# Patient Record
Sex: Male | Born: 1940
Health system: Southern US, Community
[De-identification: ages and names within clinical notes are randomized; demographics above are authoritative.]

## PROBLEM LIST (undated history)

## (undated) DIAGNOSIS — T148XXA Other injury of unspecified body region, initial encounter: Secondary | ICD-10-CM

## (undated) DIAGNOSIS — M1990 Unspecified osteoarthritis, unspecified site: Secondary | ICD-10-CM

## (undated) DIAGNOSIS — J189 Pneumonia, unspecified organism: Secondary | ICD-10-CM

## (undated) DIAGNOSIS — M109 Gout, unspecified: Secondary | ICD-10-CM

## (undated) DIAGNOSIS — T8859XA Other complications of anesthesia, initial encounter: Secondary | ICD-10-CM

## (undated) DIAGNOSIS — D229 Melanocytic nevi, unspecified: Secondary | ICD-10-CM

## (undated) DIAGNOSIS — C801 Malignant (primary) neoplasm, unspecified: Secondary | ICD-10-CM

## (undated) DIAGNOSIS — W548XXA Other contact with dog, initial encounter: Secondary | ICD-10-CM

## (undated) DIAGNOSIS — C4492 Squamous cell carcinoma of skin, unspecified: Secondary | ICD-10-CM

## (undated) DIAGNOSIS — L0390 Cellulitis, unspecified: Secondary | ICD-10-CM

## (undated) DIAGNOSIS — I10 Essential (primary) hypertension: Secondary | ICD-10-CM

## (undated) DIAGNOSIS — T4145XA Adverse effect of unspecified anesthetic, initial encounter: Secondary | ICD-10-CM

## (undated) HISTORY — PX: JOINT REPLACEMENT: SHX530

## (undated) HISTORY — PX: BACK SURGERY: SHX140

## (undated) HISTORY — PX: TOTAL KNEE ARTHROPLASTY: SHX125

---
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
## 1997-10-11 DIAGNOSIS — D229 Melanocytic nevi, unspecified: Secondary | ICD-10-CM

## 1997-10-11 HISTORY — DX: Melanocytic nevi, unspecified: D22.9

## 1998-12-31 HISTORY — PX: KNEE ARTHROSCOPY: SUR90

## 2000-10-08 ENCOUNTER — Encounter: Payer: Self-pay | Admitting: Neurological Surgery

## 2000-10-08 ENCOUNTER — Ambulatory Visit (HOSPITAL_COMMUNITY): Admission: RE | Admit: 2000-10-08 | Discharge: 2000-10-09 | Payer: Self-pay | Admitting: Neurological Surgery

## 2001-07-23 ENCOUNTER — Ambulatory Visit (HOSPITAL_COMMUNITY): Admission: RE | Admit: 2001-07-23 | Discharge: 2001-07-23 | Payer: Self-pay | Admitting: *Deleted

## 2001-07-23 ENCOUNTER — Encounter: Payer: Self-pay | Admitting: *Deleted

## 2001-12-31 HISTORY — PX: CYSTECTOMY: SUR359

## 2002-06-09 ENCOUNTER — Ambulatory Visit (HOSPITAL_COMMUNITY): Admission: RE | Admit: 2002-06-09 | Discharge: 2002-06-09 | Payer: Self-pay | Admitting: Family Medicine

## 2002-06-09 ENCOUNTER — Encounter: Payer: Self-pay | Admitting: Family Medicine

## 2003-04-08 ENCOUNTER — Encounter: Payer: Self-pay | Admitting: Emergency Medicine

## 2003-04-08 ENCOUNTER — Emergency Department (HOSPITAL_COMMUNITY): Admission: EM | Admit: 2003-04-08 | Discharge: 2003-04-08 | Payer: Self-pay | Admitting: Emergency Medicine

## 2004-08-08 ENCOUNTER — Ambulatory Visit (HOSPITAL_COMMUNITY): Admission: RE | Admit: 2004-08-08 | Discharge: 2004-08-08 | Payer: Self-pay | Admitting: Family Medicine

## 2004-11-21 DIAGNOSIS — C801 Malignant (primary) neoplasm, unspecified: Secondary | ICD-10-CM

## 2004-11-21 HISTORY — DX: Malignant (primary) neoplasm, unspecified: C80.1

## 2005-01-29 ENCOUNTER — Ambulatory Visit (HOSPITAL_COMMUNITY): Admission: RE | Admit: 2005-01-29 | Discharge: 2005-01-29 | Payer: Self-pay | Admitting: Family Medicine

## 2005-02-01 ENCOUNTER — Ambulatory Visit (HOSPITAL_COMMUNITY): Admission: RE | Admit: 2005-02-01 | Discharge: 2005-02-01 | Payer: Self-pay | Admitting: Family Medicine

## 2005-09-04 ENCOUNTER — Ambulatory Visit: Payer: Self-pay | Admitting: Internal Medicine

## 2005-09-04 ENCOUNTER — Ambulatory Visit (HOSPITAL_COMMUNITY): Admission: RE | Admit: 2005-09-04 | Discharge: 2005-09-04 | Payer: Self-pay | Admitting: Internal Medicine

## 2006-07-17 ENCOUNTER — Ambulatory Visit (HOSPITAL_BASED_OUTPATIENT_CLINIC_OR_DEPARTMENT_OTHER): Admission: RE | Admit: 2006-07-17 | Discharge: 2006-07-17 | Payer: Self-pay | Admitting: Orthopedic Surgery

## 2010-07-19 ENCOUNTER — Ambulatory Visit (HOSPITAL_COMMUNITY): Admission: RE | Admit: 2010-07-19 | Discharge: 2010-07-19 | Payer: Self-pay | Admitting: Family Medicine

## 2010-10-18 ENCOUNTER — Encounter: Payer: Self-pay | Admitting: Internal Medicine

## 2010-11-10 ENCOUNTER — Ambulatory Visit (HOSPITAL_COMMUNITY): Admission: RE | Admit: 2010-11-10 | Discharge: 2010-11-10 | Payer: Self-pay | Admitting: Internal Medicine

## 2010-11-10 ENCOUNTER — Ambulatory Visit: Payer: Self-pay | Admitting: Internal Medicine

## 2010-11-10 HISTORY — PX: COLONOSCOPY: SHX174

## 2010-11-15 ENCOUNTER — Encounter: Payer: Self-pay | Admitting: Internal Medicine

## 2011-01-30 NOTE — Letter (Signed)
Summary: Patient Notice, Colon Biopsy Results  [facility]  [address]   [place], [place] [address]   Phone: [phone]  Fax: [phone]       November 15, 2010   [facility] [address] [place], [place]  [address] Oct 30, 1941    Dear Nicholas Lewis,  I am pleased to inform you that the biopsies taken during your recent colonoscopy did not show any evidence of cancer upon pathologic examination.  Additional information/recommendations:  No further action is needed at this time.  Please follow-up with your primary care physician for your other healthcare needs.  You should have a repeat colonoscopy examination  in 5 years.  Please call us if you are having persistent problems or have questions about your condition that have not been fully answered at this time.  Sincerely,    R. Roetta Sessions MD, FACP [facility] Ph: [phone]    Fax: [phone]   Appended Document: Patient Notice, Colon Biopsy Results Letter mailed to pt.  Appended Document: Patient Notice, Colon Biopsy Results reminder in computer

## 2011-01-30 NOTE — Letter (Signed)
Summary: TRIAGE ORDER  TRIAGE ORDER   Imported By: Ave Filter 10/18/2010 09:25:22  _____________________________________________________________________  External Attachment:    Type:   Image     Comment:   External Document  Appended Document: TRIAGE ORDER ok as is  Appended Document: TRIAGE ORDER Instructions mailed.

## 2011-05-18 NOTE — Op Note (Signed)
NAMEONOFRE, GAINS NO.:  [id number]   MEDICAL RECORD NO.:  [id number]          PATIENT TYPE:  AMB   LOCATION:  [facility]                         FACILITY:  [facility]   PHYSICIAN:  Marlowe Kays, M.D.  DATE OF BIRTH:  1941-06-29   DATE OF PROCEDURE:  07/17/2006  DATE OF DISCHARGE:                                 OPERATIVE REPORT   PREOPERATIVE DIAGNOSIS:  1.  Torn medial meniscus.  2.  Osteoarthritis left knee.   POSTOPERATIVE DIAGNOSIS:  1.  Torn medial meniscus.  2.  Osteoarthritis left knee.   OPERATION:  Left knee arthroscopy with:  1.  Partial medial meniscectomy.  2.  Debridement of medial femoral condyle and medial tibial plateau.   SURGEON:  Marlowe Kays, M.D.   ASSISTANT:  Nurse.   ANESTHESIA:  General.   INDICATIONS FOR PROCEDURE:  Pain and swelling of the left knee with plain x-  rays demonstrating some medial compartment narrowing and MRI demonstrating a  posterior horn tear of medial meniscus.  There also was felt to be a little  mucoid degeneration of the ACL and the intercondylar area.   PROCEDURE:  Satisfactory general anesthesia, pneumatic tourniquet, left leg  was Esmarch'd out non-sterilely, thigh stabilizer applied, and the leg  prepped with DuraPrep from stabilizer to ankle and draped in a sterile  field.  Ace wrap and knee support for right knee.  Superior medial saline  inflow. First, through an anterolateral portal, the medial compartment of  the knee joint was evaluated.  He had a good bit of synovitis in the  anterior compartment and a little roughening of the anterior third of the  medial meniscus.  I debrided out the synovium and shaved down the medial  meniscus.  There was a large osteophyte anteriorly which I was able to shave  down with the shaver.  His medial femoral condyle had a grade 2-3 out of 4  chondromalacia and I was able to smooth this down gently with the shaver.  Posteriorly, he had fairly pronounced tear  beginning at the posterior curve  and extending to the posterior third which I managed with a combination of  cutting out with arthroscopic scissors and shaving down until smooth with a  3.5 shaver.  Adjacent to this was a fairly substantial area of full  thickness wear of the medial tibial plateau which, however, did not require  any shaving. The intercondylar area of the ACL did have a little  fragmentation or fraying which I resected with some small baskets.  Pre and  post films here were taken.  I then looked up in the medial gutter and  suprapatellar area.  He had some patellar wear but nothing treatable.  I  then reversed portals.  There was some wear on both sides of the joint and  some fraying of the lateral meniscus, but nothing that required any  arthroscopic treatment.  I then irrigated the knee joint until clear and all  fluid possible was removed.  I closed the two anterior portals with 4-0  nylon and then injected through the inflow apparatus 20 mL  0.5% Marcaine  with  adrenaline and 4 mg of morphine. I then closed this portal, as well, with 4-  0 nylon.  Adaptic and dry, sterile dressing were applied.  The tourniquet  was released.  He tolerated the procedure well and was taken to the recovery  room in satisfactory condition with no known complications.           ______________________________  Marlowe Kays, M.D.     JA/MEDQ  D:  07/17/2006  T:  07/17/2006  Job:  [id number]

## 2011-05-18 NOTE — Op Note (Signed)
[place]  Patient:    Nicholas Lewis, Nicholas Lewis                     MRN: [id number] Proc. Date: 10/08/00 Adm. Date:  [id number] Disc. Date: [id number] Attending:  Jonne Ply                           Operative Report  PREOPERATIVE DIAGNOSIS:  L4-5 left synovial cyst (spondylosis) with radiculopathy.  POSTOPERATIVE DIAGNOSIS:  L4-5 left synovial cyst (spondylosis with radiculopathy.  OPERATION PERFORMED:  Left L4-5 laminotomy, foraminotomy, resection of synovial cyst with MetRx system, operating microscope and microdissection technique.  SURGEON:  Stefani Dama, M.D.  ASSISTANT:  Tanya Nones. Jeral Fruit, M.D.  ANESTHESIA:  General endotracheal.  INDICATIONS FOR PROCEDURE:  The patient is a 70 year old individual who has had significant back and left lower extremity pain.  An MRI demonstrates presence of a synovial cyst with spondylitic changes at the L4-5 joint.  He was advised regarding surgery.  DESCRIPTION OF PROCEDURE:  The patient was brought to the operating room supine on a stretcher.  After smooth induction of general endotracheal anesthesia.  He was placed in the prone position, the bony prominences were appropriately padded and protected.  Then fluoroscopic imaging was brought into place to localize the L4-5 interspace.  The back was prepped with DuraPrep, draped in sterile fashion once the areas were marked.  A K-wire was then passed down to the laminar arch of L4.  The lateral region of the L4-5 space was localized.  Then a series of probes was passed over this K-wire after an incision was made measuring 15 mm in length.  The dissection was done bluntly using the initial then the dilating trocar and the area was dilated up to the 18 mm size.  A 6 cm depth trocar was then placed into the wound to provide the endoscopic imaging to the level of the L4-5 facet joint.  A laminotomy was then created removing the inferior margin of the  lamina of L4 out to the mesial wall of the facet.  A 2 and 3 mm Kerrison punch was then used to take up the yellow ligament.  The common dural tube was identified. The lateral aspect of the dural tube was noted to be severely adherent to a mass which protruded and emanated from the facet joint.  Dissection of the dura was very difficult.  The mass itself was then entered and within the mass there was noted to be a significant amount of degenerated thrombus material with some fluid consistent with synovium.  This was the inside of the cyst. The dissection was then slowly carried inferiorly to expose the inferior margin of the cyst.  This was taken down using a combination of Midas Rex drill with microdissection technique and a microscope under high power magnification evacuating the cyst wall and then eventually dissecting the cyst wall from the dura.  In the end the common dural tube, the L5 nerve root was well decompressed from the cyst.  The cyst came out in bits and pieces in a very piecemeal fashion.  The facet joint was noted to remain intact.  The area was then checked for hemostasis and once this was achieved, the trocar was removed from the wound.  The microscope was removed from the wound.  A single 2-0 stitch was placed in the fascia which could be identified in the depth and  then 3-0 Vicryl was used to close the skin subcuticularly.  The patient tolerated the procedure well.  Estimated blood loss was estimated at less than 20 cc. DD:  10/08/00 TD:  10/09/00 Job: [id number]

## 2011-05-18 NOTE — Op Note (Signed)
NAMEULMER, DEGEN              ACCOUNT NO.:  [id number]   MEDICAL RECORD NO.:  [id number]          PATIENT TYPE:  AMB   LOCATION:  DAY                           FACILITY:  [facility]   PHYSICIAN:  R. Roetta Sessions, M.D. DATE OF BIRTH:  Jan 02, 1941   DATE OF PROCEDURE:  09/04/2005  DATE OF DISCHARGE:                                 OPERATIVE REPORT   PROCEDURE:  Screening colonoscopy, high risk.   INDICATIONS FOR PROCEDURE:  The patient is a 70 year old gentleman with no  GI symptoms who comes for high-risk screening colonoscopy with positive  family history of colon cancer in his father. He had a colonoscopy five  years ago which was normal. Colonoscopy is now being done as a high-risk  screening maneuver. Potential risks, benefits, and alternatives have been  reviewed and questions answered. He is agreeable. Please see documentation  in the medical record.   PROCEDURE NOTE:  O2 saturation, blood pressure, pulse, and respirations were  monitored throughout the entire procedure. Conscious sedation with Versed 3  mg IV and Demerol 50 mg IV in divided doses.   INSTRUMENT:  Olympus video chip system.   FINDINGS:  Digital rectal exam revealed no abnormalities.   ENDOSCOPIC FINDINGS:  Prep was adequate.   Rectum:  Examination of the rectal mucosa including retroflexed view of the  anal verge revealed no abnormalities.   Colon:  Colonic mucosa was surveyed from the rectosigmoid junction through  the left, transverse, and right colon to the area of the appendiceal  orifice, ileocecal valve, and cecum. These structures were well seen and  photographed for the record. From this level, the scope was slowly  withdrawn, and all previously mentioned mucosal surfaces were again seen.  The colonic mucosa appeared normal, although the colon was elongated and  tortuous, requiring a number of maneuvers including changing of the  patient's position and external abdominal pressure to reach the cecum.  The  patient tolerated the procedure well and was reactive to endoscopy.   IMPRESSION:  1.  Normal rectum.  2.  Elongated, tortuous but otherwise normal appearing colon.   RECOMMENDATIONS:  Repeat screening colonoscopy in five years.      Jonathon Bellows, M.D.  Electronically Signed     RMR/MEDQ  D:  09/04/2005  T:  09/04/2005  Job:  [id number]   cc:   [facility]

## 2011-12-06 ENCOUNTER — Other Ambulatory Visit (HOSPITAL_COMMUNITY): Payer: Self-pay | Admitting: Orthopedic Surgery

## 2011-12-06 DIAGNOSIS — M545 Low back pain, unspecified: Secondary | ICD-10-CM

## 2011-12-07 ENCOUNTER — Ambulatory Visit (HOSPITAL_COMMUNITY)
Admission: RE | Admit: 2011-12-07 | Discharge: 2011-12-07 | Disposition: A | Discharge status: Home / Self Care | Payer: Medicare Other | Source: Ambulatory Visit | Attending: Orthopedic Surgery | Admitting: Orthopedic Surgery

## 2011-12-07 DIAGNOSIS — M5126 Other intervertebral disc displacement, lumbar region: Secondary | ICD-10-CM | POA: Insufficient documentation

## 2011-12-07 DIAGNOSIS — M545 Low back pain, unspecified: Secondary | ICD-10-CM | POA: Insufficient documentation

## 2012-03-19 DIAGNOSIS — J029 Acute pharyngitis, unspecified: Secondary | ICD-10-CM | POA: Diagnosis not present

## 2012-06-20 DIAGNOSIS — IMO0002 Reserved for concepts with insufficient information to code with codable children: Secondary | ICD-10-CM | POA: Diagnosis not present

## 2012-06-20 DIAGNOSIS — M1710 Unilateral primary osteoarthritis, unspecified knee: Secondary | ICD-10-CM | POA: Diagnosis not present

## 2012-07-21 ENCOUNTER — Other Ambulatory Visit: Payer: Self-pay | Admitting: Orthopedic Surgery

## 2012-07-21 NOTE — H&P (Signed)
Nicholas Lewis, TRAMELL NO.:  [id number]  MEDICAL RECORD NO.:  [id number]  LOCATION:                               FACILITY:  [facility]  PHYSICIAN:  Marlowe Kays, M.D.  DATE OF BIRTH:  Jan 05, 1941  DATE OF ADMISSION:  08/06/2012 DATE OF DISCHARGE:                             HISTORY & PHYSICAL   CHIEF COMPLAINT:  "Pain in my left knee."  PRESENT ILLNESS:  This 71 year old white male has been seen by [place] for continued progressive problems concerning his left knee.  He has had serial x-rays over the years, showing deterioration of the articular cartilage of the left knee.  We tried conservative care with viscosupplementation for a long period of time.  He has even had a knee arthroscopy in the past with removal of injured cartilage as well. After much discussion including the risks and benefits of surgery, we decided to go ahead with total knee replacement arthroplasty of the left knee.  PAST MEDICAL HISTORY:  The patient has been in relatively good health throughout his lifetime.  He has developed hypertension, currently being treated by Dr. Assunta Found.  He has other minor arthritis but mainly in his left knee.  Spinal cyst removed by Dr. Erma Pinto in 2013.  His left knee arthroscopy was in 2000.  He also has a history of gout.  FAMILY HISTORY:  Positive for colon cancer in the father, dying at 38 years of age, scleroderma in mother at 29 years of age.  Siblings, 1 sister with breast cancer.  SOCIAL HISTORY:  The patient is married.  Never intake of alcohol or tobacco products.  No EtOH.  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  ALLERGIES:  He is allergic to SULFA.  CURRENT MEDICATIONS:  Metoprolol 100 mg twice a day, allopurinol 300 mg daily, nabumetone 500 mg daily, hydrochlorothiazide 25 mg daily, nasal sprays daily, multivitamin saw palmetto, aspirin every other day.  PHYSICAL EXAMINATION:  GENERAL:  Alert and cooperative, friendly  71 year old, 250 pounds, 6 feet white male, accompanied by his wife, [name]. VITAL SIGNS:  Blood pressure 139/87, pulse 68, respirations 12 unlabored. HEENT:  Normocephalic.  PERRLA.  EOM intact.  Oropharynx clear. CHEST:  Clear to auscultation.  No rhonchi.  No rales. HEART:  Regular rate and rhythm.  No murmurs are heard. ABDOMEN:  Soft, nontender.  Liver, spleen not felt. GENITALIA/ RECTAL:  Not done.  Not pertinent to his illness. EXTREMITIES:  The patient has some mild crepitus with pain, range of motion, left knee, and a varus deformity.  PLAN:  The patient will be admitted for total knee replacement arthroplasty.  We discussed that at this history and physical, the risks and benefits of surgery again as well as the perioperative plan including home physical therapy.     Nicholas Lewis June.   ______________________________ Marlowe Kays, M.D.    DLU/MEDQ  D:  07/18/2012  T:  07/19/2012  Job:  [id number]

## 2012-07-22 ENCOUNTER — Encounter (HOSPITAL_COMMUNITY): Payer: Self-pay | Admitting: Pharmacy Technician

## 2012-07-23 ENCOUNTER — Encounter (HOSPITAL_COMMUNITY): Payer: Self-pay

## 2012-07-23 ENCOUNTER — Encounter (HOSPITAL_COMMUNITY)
Admission: RE | Admit: 2012-07-23 | Discharge: 2012-07-23 | Disposition: A | Discharge status: Home / Self Care | Payer: Medicare Other | Source: Ambulatory Visit | Attending: Orthopedic Surgery | Admitting: Orthopedic Surgery

## 2012-07-23 ENCOUNTER — Ambulatory Visit (HOSPITAL_COMMUNITY)
Admission: RE | Admit: 2012-07-23 | Discharge: 2012-07-23 | Disposition: A | Discharge status: Home / Self Care | Payer: Medicare Other | Source: Ambulatory Visit | Attending: Orthopedic Surgery | Admitting: Orthopedic Surgery

## 2012-07-23 DIAGNOSIS — I10 Essential (primary) hypertension: Secondary | ICD-10-CM | POA: Diagnosis not present

## 2012-07-23 DIAGNOSIS — M1710 Unilateral primary osteoarthritis, unspecified knee: Secondary | ICD-10-CM | POA: Insufficient documentation

## 2012-07-23 DIAGNOSIS — Z01812 Encounter for preprocedural laboratory examination: Secondary | ICD-10-CM | POA: Diagnosis not present

## 2012-07-23 HISTORY — DX: Other injury of unspecified body region, initial encounter: T14.8XXA

## 2012-07-23 HISTORY — DX: Gout, unspecified: M10.9

## 2012-07-23 HISTORY — DX: Unspecified osteoarthritis, unspecified site: M19.90

## 2012-07-23 HISTORY — DX: Adverse effect of unspecified anesthetic, initial encounter: T41.45XA

## 2012-07-23 HISTORY — DX: Malignant (primary) neoplasm, unspecified: C80.1

## 2012-07-23 HISTORY — DX: Other complications of anesthesia, initial encounter: T88.59XA

## 2012-07-23 HISTORY — DX: Essential (primary) hypertension: I10

## 2012-07-23 LAB — BASIC METABOLIC PANEL
BUN: 22 mg/dL (ref 6–23)
CO2: 27 mEq/L (ref 19–32)
Calcium: 9.3 mg/dL (ref 8.4–10.5)
Chloride: 103 mEq/L (ref 96–112)
Creatinine, Ser: 1.14 mg/dL (ref 0.50–1.35)
GFR calc Af Amer: 73 mL/min — ABNORMAL LOW (ref >90)
GFR calc non Af Amer: 63 mL/min — ABNORMAL LOW (ref >90)
Glucose, Bld: 99 mg/dL (ref 70–99)
Potassium: 3.4 mEq/L — ABNORMAL LOW (ref 3.5–5.1)
Sodium: 140 mEq/L (ref 135–145)

## 2012-07-23 LAB — SURGICAL PCR SCREEN
MRSA, PCR: NEGATIVE
Staphylococcus aureus: NEGATIVE

## 2012-07-23 LAB — CBC
HCT: 41.3 % (ref 39.0–52.0)
Hemoglobin: 14.6 g/dL (ref 13.0–17.0)
MCH: 31.1 pg (ref 26.0–34.0)
MCHC: 35.4 g/dL (ref 30.0–36.0)
MCV: 87.9 fL (ref 78.0–100.0)
Platelets: 168 10*3/uL (ref 150–400)
RBC: 4.7 MIL/uL (ref 4.22–5.81)
RDW: 13.2 % (ref 11.5–15.5)
WBC: 7.7 10*3/uL (ref 4.0–10.5)

## 2012-07-23 LAB — APTT: aPTT: 30 seconds (ref 24–37)

## 2012-07-23 LAB — PROTIME-INR
INR: 1.02 (ref 0.00–1.49)
Prothrombin Time: 13.6 seconds (ref 11.6–15.2)

## 2012-07-23 NOTE — Patient Instructions (Addendum)
[address] [facility]  07/23/2012   Your procedure is scheduled on:  08/06/12 8:30 AM  Report to SHORT STAY DEPT  at 6:30 AM.  Call this number if you have problems the morning of surgery: [phone]   Remember:   Do not eat food or drink liquids AFTER MIDNIGHT    Take these medicines the morning of surgery with A SIP OF WATER: ALLOPURINOL / METOPROLOL   Do not wear jewelry, make-up or nail polish.  Do not wear lotions, powders, or perfumes.   Do not shave legs or underarms 48 hrs. before surgery (men may shave face)  Do not bring valuables to the hospital.  Contacts, dentures or bridgework may not be worn into surgery.  Leave suitcase in the car. After surgery it may be brought to your room.  For patients admitted to the hospital, checkout time is 11:00 AM the day of discharge.   Patients discharged the day of surgery will not be allowed to drive home.    Special Instructions:   Please read over the following fact sheets that you were given: MRSA  Information                SHOWER WITH BETASEPT THE NIGHT BEFORE SURGERY AND THE MORNING OF SURGERY

## 2012-07-23 NOTE — Progress Notes (Signed)
07/23/12 1112  OBSTRUCTIVE SLEEP APNEA  Have you ever been diagnosed with sleep apnea through a sleep study? No  Do you snore loudly (loud enough to be heard through closed doors)?  0  Do you often feel tired, fatigued, or sleepy during the daytime? 0  Has anyone observed you stop breathing during your sleep? 0  Do you have, or are you being treated for high blood pressure? 1  BMI more than 35 kg/m2? 0  Age over 70 years old? 1  Neck circumference greater than 40 cm/18 inches? 1  Gender: 1  Obstructive Sleep Apnea Score 4   Score 4 or greater  Updated health history;Results sent to PCP

## 2012-07-31 DIAGNOSIS — L0390 Cellulitis, unspecified: Secondary | ICD-10-CM

## 2012-07-31 HISTORY — DX: Cellulitis, unspecified: L03.90

## 2012-08-04 DIAGNOSIS — M4310 Spondylolisthesis, site unspecified: Secondary | ICD-10-CM | POA: Diagnosis not present

## 2012-08-04 DIAGNOSIS — IMO0002 Reserved for concepts with insufficient information to code with codable children: Secondary | ICD-10-CM | POA: Diagnosis not present

## 2012-08-06 ENCOUNTER — Encounter (HOSPITAL_COMMUNITY): Payer: Self-pay | Admitting: Orthopedic Surgery

## 2012-08-06 ENCOUNTER — Encounter (HOSPITAL_COMMUNITY): Payer: Self-pay | Admitting: Anesthesiology

## 2012-08-06 ENCOUNTER — Inpatient Hospital Stay (HOSPITAL_COMMUNITY)
Admission: RE | Admit: 2012-08-06 | Discharge: 2012-08-10 | DRG: 470 | Disposition: A | Discharge status: Home Health | Payer: Medicare Other | Source: Ambulatory Visit | Attending: Orthopedic Surgery | Admitting: Orthopedic Surgery

## 2012-08-06 ENCOUNTER — Ambulatory Visit (HOSPITAL_COMMUNITY): Payer: Medicare Other | Admitting: Anesthesiology

## 2012-08-06 ENCOUNTER — Ambulatory Visit (HOSPITAL_COMMUNITY): Payer: Medicare Other

## 2012-08-06 ENCOUNTER — Encounter (HOSPITAL_COMMUNITY): Payer: Self-pay | Admitting: *Deleted

## 2012-08-06 ENCOUNTER — Encounter (HOSPITAL_COMMUNITY)
Admission: RE | Disposition: A | Discharge status: Home Health | Payer: Self-pay | Source: Ambulatory Visit | Attending: Orthopedic Surgery

## 2012-08-06 DIAGNOSIS — I10 Essential (primary) hypertension: Secondary | ICD-10-CM | POA: Diagnosis not present

## 2012-08-06 DIAGNOSIS — M1710 Unilateral primary osteoarthritis, unspecified knee: Secondary | ICD-10-CM | POA: Diagnosis not present

## 2012-08-06 DIAGNOSIS — Z471 Aftercare following joint replacement surgery: Secondary | ICD-10-CM | POA: Diagnosis not present

## 2012-08-06 DIAGNOSIS — M25569 Pain in unspecified knee: Secondary | ICD-10-CM | POA: Diagnosis not present

## 2012-08-06 DIAGNOSIS — Z96659 Presence of unspecified artificial knee joint: Secondary | ICD-10-CM

## 2012-08-06 DIAGNOSIS — E871 Hypo-osmolality and hyponatremia: Secondary | ICD-10-CM | POA: Diagnosis not present

## 2012-08-06 DIAGNOSIS — E876 Hypokalemia: Secondary | ICD-10-CM | POA: Diagnosis not present

## 2012-08-06 DIAGNOSIS — IMO0002 Reserved for concepts with insufficient information to code with codable children: Secondary | ICD-10-CM | POA: Diagnosis not present

## 2012-08-06 HISTORY — PX: TOTAL KNEE ARTHROPLASTY: SHX125

## 2012-08-06 LAB — TYPE AND SCREEN
ABO/RH(D): A NEG
Antibody Screen: NEGATIVE

## 2012-08-06 LAB — ABO/RH: ABO/RH(D): A NEG

## 2012-08-06 SURGERY — ARTHROPLASTY, KNEE, TOTAL
Anesthesia: General | Site: Knee | Laterality: Left | Wound class: Clean

## 2012-08-06 MED ORDER — METOCLOPRAMIDE HCL 5 MG/ML IJ SOLN
5.0000 mg | Freq: Three times a day (TID) | INTRAMUSCULAR | Status: DC | PRN
  Administered 2012-08-06: 10 mg via INTRAVENOUS
  Filled 2012-08-06: qty 2

## 2012-08-06 MED ORDER — POVIDONE-IODINE 10 % EX SOLN
CUTANEOUS | Status: DC | PRN
  Administered 2012-08-06: 1 via TOPICAL

## 2012-08-06 MED ORDER — HYDROMORPHONE HCL PF 1 MG/ML IJ SOLN
INTRAMUSCULAR | Status: AC
  Filled 2012-08-06: qty 1

## 2012-08-06 MED ORDER — PATIENT'S GUIDE TO USING COUMADIN BOOK
Freq: Once | Status: AC
  Administered 2012-08-06: 20:00:00
  Filled 2012-08-06: qty 1

## 2012-08-06 MED ORDER — LIDOCAINE HCL (CARDIAC) 20 MG/ML IV SOLN
INTRAVENOUS | Status: DC | PRN
  Administered 2012-08-06: 100 mg via INTRAVENOUS

## 2012-08-06 MED ORDER — EPHEDRINE SULFATE 50 MG/ML IJ SOLN
INTRAMUSCULAR | Status: DC | PRN
  Administered 2012-08-06: 5 mg via INTRAVENOUS

## 2012-08-06 MED ORDER — SUFENTANIL CITRATE 50 MCG/ML IV SOLN
INTRAVENOUS | Status: DC | PRN
  Administered 2012-08-06 (×3): 5 ug via INTRAVENOUS
  Administered 2012-08-06: 20 ug via INTRAVENOUS

## 2012-08-06 MED ORDER — BUPIVACAINE HCL (PF) 0.5 % IJ SOLN
INTRAMUSCULAR | Status: AC
  Filled 2012-08-06: qty 30

## 2012-08-06 MED ORDER — ONDANSETRON HCL 4 MG PO TABS: 4.0000 mg | ORAL_TABLET | Freq: Four times a day (QID) | ORAL | Status: DC | PRN

## 2012-08-06 MED ORDER — DEXTROSE 5 % IV SOLN
500.0000 mg | Freq: Four times a day (QID) | INTRAVENOUS | Status: DC | PRN
  Administered 2012-08-06: 500 mg via INTRAVENOUS
  Filled 2012-08-06: qty 5

## 2012-08-06 MED ORDER — CEFAZOLIN SODIUM-DEXTROSE 2-3 GM-% IV SOLR
2.0000 g | INTRAVENOUS | Status: AC
  Administered 2012-08-06: 2 g via INTRAVENOUS

## 2012-08-06 MED ORDER — SODIUM CHLORIDE 0.9 % IV SOLN
INTRAVENOUS | Status: DC
  Administered 2012-08-06: 100 mL/h via INTRAVENOUS
  Administered 2012-08-07 (×2): via INTRAVENOUS
  Administered 2012-08-08: 20 mL/h via INTRAVENOUS

## 2012-08-06 MED ORDER — LACTATED RINGERS IV SOLN
INTRAVENOUS | Status: DC | PRN
  Administered 2012-08-06 (×2): via INTRAVENOUS

## 2012-08-06 MED ORDER — DIPHENHYDRAMINE HCL 12.5 MG/5ML PO ELIX: 12.5000 mg | ORAL_SOLUTION | ORAL | Status: DC | PRN

## 2012-08-06 MED ORDER — POVIDONE-IODINE 7.5 % EX SOLN: Freq: Once | CUTANEOUS | Status: DC

## 2012-08-06 MED ORDER — WARFARIN VIDEO
Freq: Once | Status: AC
  Administered 2012-08-07: 12:00:00

## 2012-08-06 MED ORDER — WARFARIN - PHARMACIST DOSING INPATIENT: Freq: Every day | Status: DC

## 2012-08-06 MED ORDER — CEFAZOLIN SODIUM-DEXTROSE 2-3 GM-% IV SOLR
INTRAVENOUS | Status: AC
  Filled 2012-08-06: qty 50

## 2012-08-06 MED ORDER — ACETAMINOPHEN 10 MG/ML IV SOLN
INTRAVENOUS | Status: DC | PRN
  Administered 2012-08-06: 1000 mg via INTRAVENOUS

## 2012-08-06 MED ORDER — CEFAZOLIN SODIUM 1-5 GM-% IV SOLN
1.0000 g | Freq: Four times a day (QID) | INTRAVENOUS | Status: AC
  Administered 2012-08-06 – 2012-08-07 (×3): 1 g via INTRAVENOUS
  Filled 2012-08-06 (×3): qty 50

## 2012-08-06 MED ORDER — ACETAMINOPHEN 10 MG/ML IV SOLN
INTRAVENOUS | Status: AC
  Filled 2012-08-06: qty 100

## 2012-08-06 MED ORDER — 0.9 % SODIUM CHLORIDE (POUR BTL) OPTIME
TOPICAL | Status: DC | PRN
  Administered 2012-08-06: 1000 mL

## 2012-08-06 MED ORDER — WARFARIN SODIUM 7.5 MG PO TABS
7.5000 mg | ORAL_TABLET | Freq: Once | ORAL | Status: AC
  Administered 2012-08-06: 7.5 mg via ORAL
  Filled 2012-08-06: qty 1

## 2012-08-06 MED ORDER — HYDROMORPHONE HCL PF 1 MG/ML IJ SOLN
0.2500 mg | INTRAMUSCULAR | Status: DC | PRN
  Administered 2012-08-06 (×4): 0.5 mg via INTRAVENOUS

## 2012-08-06 MED ORDER — BUPIVACAINE-EPINEPHRINE PF 0.25-1:200000 % IJ SOLN
INTRAMUSCULAR | Status: DC | PRN
  Administered 2012-08-06: 30 mL

## 2012-08-06 MED ORDER — PROPOFOL 10 MG/ML IV EMUL
INTRAVENOUS | Status: DC | PRN
  Administered 2012-08-06: 200 mg via INTRAVENOUS

## 2012-08-06 MED ORDER — OXYCODONE-ACETAMINOPHEN 5-325 MG PO TABS
1.0000 | ORAL_TABLET | ORAL | Status: DC | PRN
  Administered 2012-08-06 – 2012-08-08 (×10): 2 via ORAL
  Administered 2012-08-09 (×2): 1 via ORAL
  Filled 2012-08-06: qty 1
  Filled 2012-08-06 (×12): qty 2

## 2012-08-06 MED ORDER — MEPERIDINE HCL 50 MG/ML IJ SOLN: 6.2500 mg | INTRAMUSCULAR | Status: DC | PRN

## 2012-08-06 MED ORDER — LACTATED RINGERS IV SOLN: INTRAVENOUS | Status: DC

## 2012-08-06 MED ORDER — ONDANSETRON HCL 4 MG/2ML IJ SOLN
INTRAMUSCULAR | Status: DC | PRN
  Administered 2012-08-06: 4 mg via INTRAVENOUS

## 2012-08-06 MED ORDER — HYDROMORPHONE HCL PF 1 MG/ML IJ SOLN
0.5000 mg | INTRAMUSCULAR | Status: DC | PRN
  Administered 2012-08-07 (×2): 1 mg via INTRAVENOUS
  Filled 2012-08-06 (×2): qty 1

## 2012-08-06 MED ORDER — BUPIVACAINE-EPINEPHRINE PF 0.25-1:200000 % IJ SOLN
INTRAMUSCULAR | Status: AC
  Filled 2012-08-06: qty 30

## 2012-08-06 MED ORDER — ROCURONIUM BROMIDE 100 MG/10ML IV SOLN
INTRAVENOUS | Status: DC | PRN
  Administered 2012-08-06: 40 mg via INTRAVENOUS

## 2012-08-06 MED ORDER — BUPIVACAINE-EPINEPHRINE (PF) 0.5% -1:200000 IJ SOLN
INTRAMUSCULAR | Status: AC
  Filled 2012-08-06: qty 10

## 2012-08-06 MED ORDER — PROMETHAZINE HCL 25 MG/ML IJ SOLN: 6.2500 mg | INTRAMUSCULAR | Status: DC | PRN

## 2012-08-06 MED ORDER — METHOCARBAMOL 500 MG PO TABS
500.0000 mg | ORAL_TABLET | Freq: Four times a day (QID) | ORAL | Status: DC | PRN
  Administered 2012-08-06 – 2012-08-08 (×7): 500 mg via ORAL
  Filled 2012-08-06 (×8): qty 1

## 2012-08-06 MED ORDER — MIDAZOLAM HCL 5 MG/5ML IJ SOLN
INTRAMUSCULAR | Status: DC | PRN
  Administered 2012-08-06: 2 mg via INTRAVENOUS

## 2012-08-06 MED ORDER — SODIUM CHLORIDE 0.9 % IR SOLN
Status: DC | PRN
  Administered 2012-08-06: 3000 mL

## 2012-08-06 SURGICAL SUPPLY — 55 items
BAG SPEC THK2 15X12 ZIP CLS (MISCELLANEOUS) ×2
BAG ZIPLOCK 12X15 (MISCELLANEOUS) ×4 IMPLANT
BANDAGE ELASTIC 4 VELCRO ST LF (GAUZE/BANDAGES/DRESSINGS) ×2 IMPLANT
BANDAGE ELASTIC 6 VELCRO ST LF (GAUZE/BANDAGES/DRESSINGS) ×2 IMPLANT
BANDAGE ESMARK 6X9 LF (GAUZE/BANDAGES/DRESSINGS) ×1 IMPLANT
BANDAGE GAUZE ELAST BULKY 4 IN (GAUZE/BANDAGES/DRESSINGS) ×2 IMPLANT
BLADE SAG 18X100X1.27 (BLADE) ×2 IMPLANT
BNDG CMPR 9X6 STRL LF SNTH (GAUZE/BANDAGES/DRESSINGS) ×1
BNDG ESMARK 6X9 LF (GAUZE/BANDAGES/DRESSINGS) ×2
CEMENT BONE 1-PACK (Cement) ×4 IMPLANT
CLOTH BEACON ORANGE TIMEOUT ST (SAFETY) ×2 IMPLANT
CONT SPECI 4OZ STER CLIK (MISCELLANEOUS) ×2 IMPLANT
CUFF TOURN SGL QUICK 34 (TOURNIQUET CUFF) ×2
CUFF TRNQT CYL 34X4X40X1 (TOURNIQUET CUFF) ×1 IMPLANT
DRAPE EXTREMITY T 121X128X90 (DRAPE) ×2 IMPLANT
DRAPE LG THREE QUARTER DISP (DRAPES) ×2 IMPLANT
DRAPE POUCH INSTRU U-SHP 10X18 (DRAPES) ×2 IMPLANT
DRAPE U-SHAPE 47X51 STRL (DRAPES) ×2 IMPLANT
DRSG ADAPTIC 3X8 NADH LF (GAUZE/BANDAGES/DRESSINGS) ×2 IMPLANT
DRSG PAD ABDOMINAL 8X10 ST (GAUZE/BANDAGES/DRESSINGS) ×2 IMPLANT
ELECT BLADE TIP CTD 4 INCH (ELECTRODE) ×2 IMPLANT
ELECT REM PT RETURN 9FT ADLT (ELECTROSURGICAL) ×2
ELECTRODE REM PT RTRN 9FT ADLT (ELECTROSURGICAL) ×1 IMPLANT
EVACUATOR 1/8 PVC DRAIN (DRAIN) ×2 IMPLANT
FACESHIELD LNG OPTICON STERILE (SAFETY) ×10 IMPLANT
GLOVE ECLIPSE 8.0 STRL XLNG CF (GLOVE) ×4 IMPLANT
GLOVE INDICATOR 8.0 STRL GRN (GLOVE) ×6 IMPLANT
GOWN STRL REIN XL XLG (GOWN DISPOSABLE) ×8 IMPLANT
HANDPIECE INTERPULSE COAX TIP (DISPOSABLE) ×1
IMMOBILIZER KNEE 20 (SOFTGOODS)
IMMOBILIZER KNEE 20 THIGH 36 (SOFTGOODS) IMPLANT
IMMOBILIZER KNEE 22 UNIV (SOFTGOODS) ×2 IMPLANT
KIT BASIN OR (CUSTOM PROCEDURE TRAY) ×2 IMPLANT
MANIFOLD NEPTUNE II (INSTRUMENTS) ×2 IMPLANT
NEEDLE HYPO 22GX1.5 SAFETY (NEEDLE) ×2 IMPLANT
NS IRRIG 1000ML POUR BTL (IV SOLUTION) ×2 IMPLANT
PACK TOTAL JOINT (CUSTOM PROCEDURE TRAY) ×2 IMPLANT
POSITIONER SURGICAL ARM (MISCELLANEOUS) ×2 IMPLANT
SET HNDPC FAN SPRY TIP SCT (DISPOSABLE) ×1 IMPLANT
SPONGE GAUZE 4X4 12PLY (GAUZE/BANDAGES/DRESSINGS) ×2 IMPLANT
SPONGE LAP 18X18 X RAY DECT (DISPOSABLE) IMPLANT
SPONGE SURGIFOAM ABS GEL 100 (HEMOSTASIS) IMPLANT
STAPLER VISISTAT 35W (STAPLE) ×2 IMPLANT
SUCTION FRAZIER 12FR DISP (SUCTIONS) ×2 IMPLANT
SUT BONE WAX W31G (SUTURE) ×2 IMPLANT
SUT VIC AB 1 CT1 27 (SUTURE) ×12
SUT VIC AB 1 CT1 27XBRD ANTBC (SUTURE) ×6 IMPLANT
SUT VIC AB 2-0 CT1 27 (SUTURE) ×4
SUT VIC AB 2-0 CT1 27XBRD (SUTURE) ×2 IMPLANT
SYR 20CC LL (SYRINGE) ×2 IMPLANT
TOWEL OR 17X26 10 PK STRL BLUE (TOWEL DISPOSABLE) ×4 IMPLANT
TOWER CARTRIDGE SMART MIX (DISPOSABLE) ×2 IMPLANT
TRAY FOLEY CATH 14FRSI W/METER (CATHETERS) IMPLANT
WATER STERILE IRR 1500ML POUR (IV SOLUTION) ×4 IMPLANT
WRAP KNEE MAXI GEL POST OP (GAUZE/BANDAGES/DRESSINGS) ×4 IMPLANT

## 2012-08-06 NOTE — Brief Op Note (Signed)
08/06/2012  10:58 AM  PATIENT:  Nicholas Lewis  71 y.o. male  PRE-OPERATIVE DIAGNOSIS:  osteoarthritis left knee  POST-OPERATIVE DIAGNOSIS:  osteoarthritis left knee  PROCEDURE:  Procedure(s) (LRB): TOTAL KNEE ARTHROPLASTY (Left)  SURGEON:  Surgeon(s) and Role:    * Drucilla Schmidt, MD - Primary  PHYSICIAN ASSISTANT: Alphonsa Overall PAC  ASSISTANTS: nurse  ANESTHESIA:   local and general  EBL:  Total I/O In: 1500 [I.V.:1500] Out: 50 [Blood:50]  BLOOD ADMINISTERED:none  DRAINS: (lt kneeknee) Hemovact drain(s) in the one with  Suction Open   LOCAL MEDICATIONS USED:  MARCAINE     SPECIMEN:  No Specimen  DISPOSITION OF SPECIMEN:  N/A  COUNTS:  YES  TOURNIQUET:   Total Tourniquet Time Documented: Thigh (Left) - 98 minutes  DICTATION: .Other Dictation: Dictation Number [id number]  PLAN OF CARE: Admit to inpatient   PATIENT DISPOSITION:  PACU - hemodynamically stable.   Delay start of Pharmacological VTE agent (>24hrs) due to surgical blood loss or risk of bleeding: yes

## 2012-08-06 NOTE — Anesthesia Postprocedure Evaluation (Signed)
  Anesthesia Post-op Note  Patient: Nicholas Lewis  Procedure(s) Performed: Procedure(s) (LRB): TOTAL KNEE ARTHROPLASTY (Left)  Patient Location: PACU  Anesthesia Type: General  Level of Consciousness: awake and alert   Airway and Oxygen Therapy: Patient Spontanous Breathing  Post-op Pain: mild  Post-op Assessment: Post-op Vital signs reviewed, Patient's Cardiovascular Status Stable, Respiratory Function Stable, Patent Airway and No signs of Nausea or vomiting  Post-op Vital Signs: stable  Complications: No apparent anesthesia complications

## 2012-08-06 NOTE — Transfer of Care (Signed)
Immediate Anesthesia Transfer of Care Note  Patient: Nicholas Lewis  Procedure(s) Performed: Procedure(s) (LRB): TOTAL KNEE ARTHROPLASTY (Left)  Patient Location: PACU  Anesthesia Type: General  Level of Consciousness: awake, alert  and oriented  Airway & Oxygen Therapy: Patient Spontanous Breathing and Patient connected to face mask oxygen  Post-op Assessment: Report given to PACU RN, Post -op Vital signs reviewed and stable and Patient moving all extremities  Post vital signs: Reviewed and stable  Complications: No apparent anesthesia complications

## 2012-08-06 NOTE — Anesthesia Preprocedure Evaluation (Addendum)
Anesthesia Evaluation  Patient identified by MRN, date of birth, ID band Patient awake    Reviewed: Allergy & Precautions, H&P , NPO status , Patient's Chart, lab work & pertinent test results  History of Anesthesia Complications Negative for: history of anesthetic complications  Airway Mallampati: III TM Distance: >3 FB Neck ROM: Full    Dental No notable dental hx.    Pulmonary neg pulmonary ROS,  breath sounds clear to auscultation  Pulmonary exam normal       Cardiovascular hypertension, Pt. on medications and Pt. on home beta blockers negative cardio ROS  Rhythm:Regular Rate:Normal     Neuro/Psych negative neurological ROS  negative psych ROS   GI/Hepatic negative GI ROS, Neg liver ROS,   Endo/Other  negative endocrine ROS  Renal/GU negative Renal ROS  negative genitourinary   Musculoskeletal negative musculoskeletal ROS (+)   Abdominal   Peds negative pediatric ROS (+)  Hematology negative hematology ROS (+)   Anesthesia Other Findings   Reproductive/Obstetrics negative OB ROS                          Anesthesia Physical Anesthesia Plan  ASA: II  Anesthesia Plan: General   Post-op Pain Management:    Induction: Intravenous  Airway Management Planned:   Additional Equipment:   Intra-op Plan:   Post-operative Plan: Extubation in OR  Informed Consent: I have reviewed the patients History and Physical, chart, labs and discussed the procedure including the risks, benefits and alternatives for the proposed anesthesia with the patient or authorized representative who has indicated his/her understanding and acceptance.   Dental advisory given  Plan Discussed with: CRNA  Anesthesia Plan Comments:         Anesthesia Quick Evaluation

## 2012-08-06 NOTE — Progress Notes (Signed)
ANTICOAGULATION CONSULT NOTE - Initial Consult  Pharmacy Consult for:  Coumadin Indication:  VTE prophylaxis following left TKA on 08/06/12  Allergies  Allergen Reactions  . Sulfa Antibiotics     Reaction=dizziness and vision problems  . Mobic (Meloxicam) Rash    Patient Measurements: Height: 6' (182.9 cm) Weight: 230 lb (104.327 kg) IBW/kg (Calculated) : 77.6   Vital Signs: Temp: 97.7 F (36.5 C) (08/07 1549) Temp src: Axillary (08/07 1549) BP: 114/73 mmHg (08/07 1549) Pulse Rate: 62  (08/07 1549)  Labs: 07/23/12 - Hgb 14.6   Hct 41.3   PT 13.6   INR 1.02   PTT 30  Estimated Creatinine Clearance: 75.3 ml/min (by C-G formula based on Cr of 1.14).   Medical History: Past Medical History  Diagnosis Date  . Complication of anesthesia     slow to wake up  . Hypertension   . Arthritis   . Gout   . Abrasion     l arm  . Cancer     skin  . Sleep apnea     stop bang score 4    Medications:  Prescriptions prior to admission  Medication Sig Dispense Refill  . allopurinol (ZYLOPRIM) 300 MG tablet Take 300 mg by mouth every morning.      Marland Kitchen aspirin 81 MG chewable tablet Chew 81 mg by mouth every other day.      . fluticasone (FLONASE) 50 MCG/ACT nasal spray Place 2 sprays into the nose daily as needed. For congestion      . hydrochlorothiazide (HYDRODIURIL) 25 MG tablet Take 25 mg by mouth every morning.      . metoprolol (LOPRESSOR) 100 MG tablet Take 100 mg by mouth 2 (two) times daily.      . Multiple Vitamin (MULTIVITAMIN WITH MINERALS) TABS Take 1 tablet by mouth every morning.      . nabumetone (RELAFEN) 500 MG tablet Take 500 mg by mouth 2 (two) times daily.      . saw palmetto 160 MG capsule Take 160 mg by mouth every morning.        Assessment:  Asked to assist with Coumadin therapy for this 71 year-old male following left TKA.  The baseline coagulation labs from 07/23/12 are within normal limits.  No drug interactions with Coumadin have been  identified.  Goals of Therapy:    INR 2-3  Prevention of VTE  Plan:   Coumadin 7.5 mg as initial dose  Follow PT/INR daily  Coumadin education  Polo Riley Rollwage 08/06/2012,3:53 PM

## 2012-08-06 NOTE — H&P (Signed)
I have reviewed his H and P and agree.  He has had no new health issues since then

## 2012-08-06 NOTE — Progress Notes (Signed)
Utilization review completed.  

## 2012-08-07 ENCOUNTER — Encounter (HOSPITAL_COMMUNITY): Payer: Self-pay | Admitting: Orthopedic Surgery

## 2012-08-07 LAB — HEMOGLOBIN AND HEMATOCRIT, BLOOD
HCT: 36.1 % — ABNORMAL LOW (ref 39.0–52.0)
Hemoglobin: 12.6 g/dL — ABNORMAL LOW (ref 13.0–17.0)

## 2012-08-07 LAB — BASIC METABOLIC PANEL
BUN: 16 mg/dL (ref 6–23)
CO2: 28 mEq/L (ref 19–32)
Calcium: 8.3 mg/dL — ABNORMAL LOW (ref 8.4–10.5)
Chloride: 99 mEq/L (ref 96–112)
Creatinine, Ser: 0.91 mg/dL (ref 0.50–1.35)
GFR calc Af Amer: >90 mL/min (ref >90)
GFR calc non Af Amer: 84 mL/min — ABNORMAL LOW (ref >90)
Glucose, Bld: 156 mg/dL — ABNORMAL HIGH (ref 70–99)
Potassium: 3.6 mEq/L (ref 3.5–5.1)
Sodium: 136 mEq/L (ref 135–145)

## 2012-08-07 LAB — PROTIME-INR
INR: 1.09 (ref 0.00–1.49)
Prothrombin Time: 14.3 seconds (ref 11.6–15.2)

## 2012-08-07 MED ORDER — WARFARIN SODIUM 7.5 MG PO TABS
7.5000 mg | ORAL_TABLET | Freq: Once | ORAL | Status: AC
  Administered 2012-08-07: 7.5 mg via ORAL
  Filled 2012-08-07: qty 1

## 2012-08-07 MED ORDER — ALLOPURINOL 300 MG PO TABS
300.0000 mg | ORAL_TABLET | Freq: Every morning | ORAL | Status: DC
  Administered 2012-08-07 – 2012-08-10 (×4): 300 mg via ORAL
  Filled 2012-08-07 (×4): qty 1

## 2012-08-07 MED ORDER — METOPROLOL TARTRATE 100 MG PO TABS
100.0000 mg | ORAL_TABLET | Freq: Two times a day (BID) | ORAL | Status: DC
  Administered 2012-08-07 – 2012-08-10 (×6): 100 mg via ORAL
  Filled 2012-08-07 (×10): qty 1

## 2012-08-07 MED ORDER — HYDROCHLOROTHIAZIDE 25 MG PO TABS
25.0000 mg | ORAL_TABLET | Freq: Every morning | ORAL | Status: DC
  Administered 2012-08-07 – 2012-08-10 (×4): 25 mg via ORAL
  Filled 2012-08-07 (×4): qty 1

## 2012-08-07 MED ORDER — FLUTICASONE PROPIONATE 50 MCG/ACT NA SUSP
2.0000 | Freq: Every day | NASAL | Status: DC | PRN
  Filled 2012-08-07: qty 16

## 2012-08-07 NOTE — Progress Notes (Signed)
POD 1--alert. NV intact.  Hgb pending.  Post-op xrays good.

## 2012-08-07 NOTE — Evaluation (Signed)
Occupational Therapy Evaluation Patient Details Name: Nicholas Lewis MRN: [id number] DOB: [birth year] Today's Date: 08/07/2012 Time: 2952-8413 OT Time Calculation (min): 19 min  OT Assessment / Plan / Recommendation Clinical Impression  Pt doing well POD 1 LTKR. All education completed. Pt will have necessary level of A from family upon d/c.     OT Assessment  Patient does not need any further OT services    Follow Up Recommendations  No OT follow up    Barriers to Discharge      Equipment Recommendations  3 in 1 bedside comode    Recommendations for Other Services    Frequency       Precautions / Restrictions Precautions Precautions: Knee Required Braces or Orthoses: Knee Immobilizer - Left Knee Immobilizer - Left: Discontinue once straight leg raise with < 10 degree lag   Pertinent Vitals/Pain Reported 5/10 L knee pain initially. Pt was premedicated before OTs arrival.    ADL  Grooming: Simulated;Min guard Where Assessed - Grooming: Supported standing Toilet Transfer: Performed;Min Pension scheme manager Method: Sit to Barista: Raised toilet seat with arms (or 3-in-1 over toilet) Toileting - Clothing Manipulation and Hygiene: Simulated;Min guard Where Assessed - Engineer, mining and Hygiene: Sit to stand from 3-in-1 or toilet Tub/Shower Transfer: Performed;Min guard Tub/Shower Transfer Method: Science writer: Walk in Scientist, research (physical sciences) Used: Rolling walker;Gait belt Transfers/Ambulation Related to ADLs: Pt ambulated to the bathroom with mingurard A. ADL Comments: Pt stated his wife would initially be able to assist him with dressing/bathing.    OT Diagnosis:    OT Problem List:   OT Treatment Interventions:     OT Goals    Visit Information  Last OT Received On: 08/07/12 Assistance Needed: +1    Subjective Data  Subjective: I just got up for the first time at 1230. Patient Stated Goal: Return  home with assistance from wife.   Prior Functioning  Vision/Perception  Home Living Lives With: Spouse Available Help at Discharge: Family;Available 24 hours/day Type of Home: House Home Access: Stairs to enter [employer] of Steps: 2 Entrance Stairs-Rails: None Home Layout: One level Bathroom Shower/Tub: Health visitor: Standard Bathroom Accessibility: Yes How Accessible: Accessible via walker Home Adaptive Equipment: None Prior Function Level of Independence: Independent Able to Take Stairs?: Yes Driving: Yes Vocation: Retired Comments: retired Social worker: No difficulties Dominant Hand: Right      Cognition  Overall Cognitive Status: Appears within functional limits for tasks assessed/performed Arousal/Alertness: Awake/alert Orientation Level: Appears intact for tasks assessed Behavior During Session: [facility] for tasks performed    Extremity/Trunk Assessment Right Upper Extremity Assessment RUE ROM/Strength/Tone: [facility] for tasks assessed Left Upper Extremity Assessment LUE ROM/Strength/Tone: WFL for tasks assessed   Mobility Transfers Transfers: Sit to Stand;Stand to Sit Sit to Stand: 4: Min guard;With upper extremity assist;With armrests;From chair/3-in-1 Stand to Sit: 4: Min guard;With upper extremity assist;To chair/3-in-1;With armrests Details for Transfer Assistance: Min VCs for hand placement and LLE management.   Exercise    Balance    End of Session OT - End of Session Equipment Utilized During Treatment: Gait belt Activity Tolerance: Patient tolerated treatment well Patient left: in chair;with call bell/phone within reach   GO     Aerika Groll A OTR/L [phone] 08/07/2012, 2:49 PM

## 2012-08-07 NOTE — Op Note (Signed)
NAMEMICAI, APOLINAR NO.:  [id number]  MEDICAL RECORD NO.:  [id number]  LOCATION:  1618                         FACILITY:  [facility]  PHYSICIAN:  Marlowe Kays, M.D.  DATE OF BIRTH:  08/14/1941  DATE OF PROCEDURE:  08/06/2012 DATE OF DISCHARGE:                              OPERATIVE REPORT   PREOPERATIVE DIAGNOSIS:  End-stage osteoarthritis, left knee.  POSTOPERATIVE DIAGNOSIS:  End-stage osteoarthritis, left knee.  OPERATION:  Osteonics total knee replacement, left.  SURGEON:  Marlowe Kays, MD  ASSISTANT:  Donnie Coffin. Dixon, PA-C  ANESTHESIA:  General.  PATHOLOGY AND JUSTIFICATION FOR PROCEDURE:  He had bone-on-bone abutment medially in the knee and has been through the efflux of sequence.  At this point, has a flexion contracture and a considerable pain and felt at this time to go ahead with the knee replacement.  Mr. Darletta Moll assistance was required because of the complexity of the operation for assistance with retraction maneuvering the leg and just a general functions that had required for the knee replacement.  PROCEDURE:  Prophylactic antibiotics, satisfied general anesthesia, lateral hip stabilizer, sure foot, pneumatic tourniquet.  Left leg was prepped with DuraPrep from tourniquet to ankle and draped in a sterile field.  Ioban employed.  Leg was Esmarch out sterilely and tourniquet was inflated to 325 mmHg.  Time-out was performed.  Vertical midline incision with median parapatellar incision to open the joint.  The pace anserinus was undermined along the medial collateral ligament off the tibia.  The patellar mechanism was evert and the knee flexed.  Extensive osteophytes were removed from around the femur and patella.  I sized the patella at a 26.  Portions of the ACL and both menisci were removed.  I made a 5/16-since drill hole in the distal femur followed by canal finder and then the axis liner for making the distal femoral cut.  He had a  flexion contracture, and I elected to take 12 mm off the distal femur.  We then used the sizing jig at a size 9, and scribe holes were placed on the distal cut femur followed by the femoral cutting jig and anterior and posterior cuts and posterior and anterior chamfer rings were then made.  I then went to the tibia and made a leveling cut and followed this by sizing, was temporarily looked like a size 9, and using the base plate, made my intramedullary drill holes followed by canal finder.  I then elected to take a 4-mm 90-degree cut using the recessed medial and tibial plateau as a guide.  I then removed remnants of the menisci.  I then placed the femoral guide for creating the notchplasty and also making the notchplasty.  After making these 2 cuts, I then placed a laminar spreader and removed remnants of bone and soft tissue from behind both femoral condyles.  I then went through a trial reduction and found that using both femoral and tibial components and found that a 10-mm spacer and perhaps a 12 fit nicely and that no additional bone need to be removed.  While the knee was in extension, I then used the 10-mm recess cutting jig to make a 10-mm recess cut.  I then used the guide to create three fixation holes and then placed a trial 26 patella and removed bone from around the perimeter.  I then went to the tibia once again and we sized it at a 26, resized it at a size 9.  Little while, the knee was in extension.  I then used the base plate with extramedullary rods splitting the bimalleolar distance to make scribe lines on the tibia and using the scribe lines, attached the base plate with 3 pins and then I used the tripod apparatus to ream for the tibial stem up to a 9 cemented.  We then water picked the knee, and after checking on the components, we went ahead and individually glued in the 3 components starting first with the tibia impacting it and removing excess methylmethacrylate  around the base plate.  The same was done for the femur and then, the knee was held in extension with a 10-mm spacer while I glued in the patella and in holding with patellar clamp. Gelfoam was placed in the distal femoral hole and bone wax around portions of the raw bone while the methacrylate was setting.  I also injected the soft tissues with 0.25% Marcaine with adrenaline.  When the methacrylate hardened, I checked to make sure that there were no small areas of methacrylate.  We irrigated the wound well and I tried a 12-mm spacer and found that it fit better than the 10-mm spacer.  Accordingly, I went ahead and used the final 12-mm posterior stabilized insert.  The knee had excellent extension, flexion and was stable medially and laterally.  No lateral release was needed for the patellar mechanism.  I then closed the wound over a Hemovac in layers with interrupted #1 Vicryl and 2 layers in the quadriceps tendon and distally in the synovium and capsule.  Subcutaneous tissue was closed with 2-0 Vicryl, staples in the skin.  Betadine, Adaptic, and dry sterile dressing were applied.  Tourniquet was released.  He tolerated the procedure well, was taken to the recovery room in satisfactory condition with no known complications and no blood loss.          ______________________________ Marlowe Kays, M.D.     JA/MEDQ  D:  08/06/2012  T:  08/07/2012  Job:  [id number]

## 2012-08-07 NOTE — Progress Notes (Signed)
ANTICOAGULATION CONSULT NOTE - Follow Up Consult  Pharmacy Consult for Warfarin Indication: VTE prophylaxis following left TKA on 08/06/12  Allergies  Allergen Reactions  . Sulfa Antibiotics     Reaction=dizziness and vision problems  . Mobic (Meloxicam) Rash    Patient Measurements: Height: 6' (182.9 cm) Weight: 230 lb (104.327 kg) IBW/kg (Calculated) : 77.6   Vital Signs: Temp: 98.9 F (37.2 C) (08/08 1030) Temp src: Oral (08/08 0624) BP: 127/75 mmHg (08/08 1030) Pulse Rate: 98  (08/08 1030)  Labs:  Basename 08/07/12 0350  HGB 12.6*  HCT 36.1*  PLT --  APTT --  LABPROT 14.3  INR 1.09  HEPARINUNFRC --  CREATININE 0.91  CKTOTAL --  CKMB --  TROPONINI --    Estimated Creatinine Clearance: 94.3 ml/min (by C-G formula based on Cr of 0.91).   Medications:  Scheduled:    . allopurinol  300 mg Oral q morning - 10a  .  ceFAZolin (ANCEF) IV  1 g Intravenous Q6H  . hydrochlorothiazide  25 mg Oral q morning - 10a  . HYDROmorphone      . HYDROmorphone      . metoprolol  100 mg Oral BID  . patient's guide to using coumadin book   Does not apply Once  . warfarin  7.5 mg Oral Once  . warfarin   Does not apply Once  . Warfarin - Pharmacist Dosing Inpatient   Does not apply q1800  . DISCONTD: povidone-iodine   Topical Once   Infusions:    . sodium chloride 100 mL/hr at 08/07/12 1255  . DISCONTD: lactated ringers      Assessment: 71 yo M s/p L TKA on 08/06/12 not on warfarin for post-op VTE prophylaxis (started 8/7pm). INR subtherapeutic as expected after one dose of warfarin. No bleeding reported in chart.  Goal of Therapy:  INR 2-3   Plan:  1) Repeat warfarin 7.5mg  PO x1 tonight 2) Daily INR 3) Warfarin education to be completed prior to discharge.  Darrol Angel, PharmD Pager: [phone] 08/07/2012,1:44 PM

## 2012-08-07 NOTE — Evaluation (Signed)
Physical Therapy Evaluation Patient Details Name: Nicholas Lewis MRN: [id number] DOB: 20-Apr-1941 Today's Date: 08/07/2012 Time: 1202-1217 PT Time Calculation (min): 15 min  PT Assessment / Plan / Recommendation Clinical Impression  70 yo male s/p L TKA. Mobilizing fairly well. Anticipate pt will continue to progress well during stay. Recommend HHPT.     PT Assessment  Patient needs continued PT services    Follow Up Recommendations  Home health PT    Barriers to Discharge        Equipment Recommendations  Rolling walker with 5" wheels    Recommendations for Other Services OT consult   Frequency 7X/week    Precautions / Restrictions Precautions Precautions: Knee Required Braces or Orthoses: Knee Immobilizer - Left Knee Immobilizer - Left: Discontinue once straight leg raise with < 10 degree lag Restrictions Weight Bearing Restrictions: No LLE Weight Bearing: Weight bearing as tolerated   Pertinent Vitals/Pain       Mobility  Bed Mobility Bed Mobility: Supine to Sit Supine to Sit: 4: Min assist;HOB elevated;With rails Details for Bed Mobility Assistance: Assist for L LE off bed. VCs safety, technique, hand placement.  Transfers Transfers: Sit to Stand;Stand to Sit Sit to Stand: 4: Min assist;With upper extremity assist;From elevated surface;From bed Stand to Sit: With upper extremity assist;With armrests;To chair/3-in-1;4: Min assist Details for Transfer Assistance: VCs safety, technique, hand placement. Assist to rise, stabilize, control descent.  Ambulation/Gait Ambulation/Gait Assistance: 4: Min assist Ambulation Distance (Feet): 50 Feet Assistive device: Rolling walker Ambulation/Gait Assistance Details: VCs safety, technique, sequence. Slow gait speed. 3 short standing rest breaks during walk.  Gait Pattern: Step-to pattern;Decreased stride length;Decreased step length - right;Decreased step length - left;Antalgic    Exercises     PT Diagnosis: Difficulty  walking;Abnormality of gait;Acute pain  PT Problem List: Decreased strength;Decreased range of motion;Decreased activity tolerance;Decreased mobility;Pain;Decreased knowledge of use of DME PT Treatment Interventions: DME instruction;Gait training;Stair training;Functional mobility training;Therapeutic activities;Therapeutic exercise;Patient/family education   PT Goals Acute Rehab PT Goals PT Goal Formulation: With patient Time For Goal Achievement: 08/14/12 Potential to Achieve Goals: Good Pt will go Supine/Side to Sit: with supervision PT Goal: Supine/Side to Sit - Progress: Goal set today Pt will go Sit to Supine/Side: with supervision PT Goal: Sit to Supine/Side - Progress: Goal set today Pt will go Sit to Stand: with supervision PT Goal: Sit to Stand - Progress: Goal set today Pt will Ambulate: 51 - 150 feet;with supervision;with least restrictive assistive device PT Goal: Ambulate - Progress: Goal set today Pt will Go Up / Down Stairs: 1-2 stairs;with min assist;with least restrictive assistive device (2 steps) PT Goal: Up/Down Stairs - Progress: Goal set today  Visit Information  Last PT Received On: 08/07/12 Assistance Needed: +1    Subjective Data  Subjective: "That wasn't as bad as I thought it would be" Patient Stated Goal: Home   Prior Functioning  Home Living Lives With: Spouse Available Help at Discharge: Family;Available 24 hours/day Type of Home: House Home Access: Stairs to enter [employer] of Steps: 2 Entrance Stairs-Rails: None Home Layout: One level Bathroom Shower/Tub: Health visitor: Standard Bathroom Accessibility: Yes How Accessible: Accessible via walker Home Adaptive Equipment: None Prior Function Level of Independence: Independent Able to Take Stairs?: Yes Driving: Yes Vocation: Retired Comments: retired Social worker: No difficulties Dominant Hand: Right    Cognition  Overall Cognitive  Status: Appears within functional limits for tasks assessed/performed Arousal/Alertness: Awake/alert Orientation Level: Appears intact for tasks assessed Behavior During  Session: [facility] for tasks performed    Extremity/Trunk Assessment Right Upper Extremity Assessment RUE ROM/Strength/Tone: [facility] for tasks assessed Left Upper Extremity Assessment LUE ROM/Strength/Tone: WFL for tasks assessed Right Lower Extremity Assessment RLE ROM/Strength/Tone: WFL for tasks assessed RLE Sensation: WFL - Light Touch Left Lower Extremity Assessment LLE ROM/Strength/Tone: Deficits LLE ROM/Strength/Tone Deficits: SLR 2+/5, moves ankle well. Poor quad set LLE Sensation: WFL - Light Touch   Balance    End of Session PT - End of Session Equipment Utilized During Treatment: Gait belt Activity Tolerance: Patient tolerated treatment well Patient left: in chair;with call bell/phone within reach;with family/visitor present CPM Left Knee CPM Left Knee: Off Left Knee Flexion (Degrees): 45  Left Knee Extension (Degrees): 0  Additional Comments: 6 to 8 hrs per day. increae by 10 to 15 degrees per day  GP     [name] [facility] 08/07/2012, 3:24 PM [phone]

## 2012-08-07 NOTE — Progress Notes (Signed)
Physical Therapy Treatment Patient Details Name: NITHIN DEMEO MRN: [id number] DOB: Dec 27, 1941 Today's Date: 08/07/2012 Time: 4098-1191 PT Time Calculation (min): 27 min  PT Assessment / Plan / Recommendation Comments on Treatment Session  Progressing well.     Follow Up Recommendations  Home health PT    Barriers to Discharge        Equipment Recommendations  Rolling walker with 5" wheels    Recommendations for Other Services OT consult  Frequency 7X/week   Plan Discharge plan remains appropriate    Precautions / Restrictions Precautions Precautions: Knee Required Braces or Orthoses: Knee Immobilizer - Left Knee Immobilizer - Left: Discontinue once straight leg raise with < 10 degree lag Restrictions Weight Bearing Restrictions: No LLE Weight Bearing: Weight bearing as tolerated   Pertinent Vitals/Pain     Mobility  Bed Mobility Bed Mobility: Sit to Supine Supine to Sit: 4: Min assist;HOB elevated;With rails Sit to Supine: 4: Min assist;With rail;HOB flat Details for Bed Mobility Assistance: Assist for L LE onto bed.  Transfers Transfers: Sit to Stand;Stand to Sit Sit to Stand: 4: Min assist Stand to Sit: 4: Min assist;With upper extremity assist;To bed Details for Transfer Assistance: VCs safety, technique, hand placement. Assist to control descent Ambulation/Gait Ambulation/Gait Assistance: 4: Min assist Ambulation Distance (Feet): 90 Feet Assistive device: Rolling walker Ambulation/Gait Assistance Details: VCs safety, sequence. Slow gait speed. 2 rest breaks during walk.  Gait Pattern: Step-to pattern;Decreased stride length;Decreased step length - right;Decreased step length - left;Antalgic    Exercises Total Joint Exercises Ankle Circles/Pumps: AROM;Both;10 reps;Supine Quad Sets: AROM;Both;10 reps;Supine Short Arc Quad: AROM;Left;10 reps;Supine Heel Slides: AAROM;Left;10 reps;Supine Hip ABduction/ADduction: AAROM;Left;10 reps;Supine Straight Leg Raises:  AROM;Left;10 reps;Supine   PT Diagnosis: Difficulty walking;Abnormality of gait;Acute pain  PT Problem List: Decreased strength;Decreased range of motion;Decreased activity tolerance;Decreased mobility;Pain;Decreased knowledge of use of DME PT Treatment Interventions: DME instruction;Gait training;Stair training;Functional mobility training;Therapeutic activities;Therapeutic exercise;Patient/family education   PT Goals Acute Rehab PT Goals PT Goal Formulation: With patient Time For Goal Achievement: 08/14/12 Potential to Achieve Goals: Good Pt will go Supine/Side to Sit: with supervision PT Goal: Supine/Side to Sit - Progress: Goal set today Pt will go Sit to Supine/Side: with supervision PT Goal: Sit to Supine/Side - Progress: Progressing toward goal Pt will go Sit to Stand: with supervision PT Goal: Sit to Stand - Progress: Progressing toward goal Pt will Ambulate: 51 - 150 feet;with supervision;with least restrictive assistive device PT Goal: Ambulate - Progress: Progressing toward goal Pt will Go Up / Down Stairs: 1-2 stairs;with min assist;with least restrictive assistive device (2 steps) PT Goal: Up/Down Stairs - Progress: Goal set today  Visit Information  Last PT Received On: 08/07/12 Assistance Needed: +1    Subjective Data  Subjective: "I can tell that muscle has been abused" Patient Stated Goal: Home   Cognition  Overall Cognitive Status: Appears within functional limits for tasks assessed/performed Arousal/Alertness: Awake/alert Orientation Level: Appears intact for tasks assessed Behavior During Session: [facility] for tasks performed    Balance     End of Session PT - End of Session Equipment Utilized During Treatment: Gait belt Activity Tolerance: Patient tolerated treatment well Patient left: in bed;with call bell/phone within reach CPM Left Knee CPM Left Knee: Off Left Knee Flexion (Degrees): 45  Left Knee Extension (Degrees): 0  Additional Comments: 6 to 8 hrs  per day. increae by 10 to 15 degrees per day   GP     [name] [facility] 08/07/2012, 3:39 PM [phone]

## 2012-08-08 LAB — BASIC METABOLIC PANEL
BUN: 13 mg/dL (ref 6–23)
CO2: 30 mEq/L (ref 19–32)
Calcium: 8.4 mg/dL (ref 8.4–10.5)
Chloride: 100 mEq/L (ref 96–112)
Creatinine, Ser: 1.03 mg/dL (ref 0.50–1.35)
GFR calc Af Amer: 83 mL/min — ABNORMAL LOW (ref >90)
GFR calc non Af Amer: 72 mL/min — ABNORMAL LOW (ref >90)
Glucose, Bld: 110 mg/dL — ABNORMAL HIGH (ref 70–99)
Potassium: 3.8 mEq/L (ref 3.5–5.1)
Sodium: 135 mEq/L (ref 135–145)

## 2012-08-08 LAB — HEMOGLOBIN AND HEMATOCRIT, BLOOD
HCT: 31.1 % — ABNORMAL LOW (ref 39.0–52.0)
Hemoglobin: 10.8 g/dL — ABNORMAL LOW (ref 13.0–17.0)

## 2012-08-08 LAB — PROTIME-INR
INR: 1.78 — ABNORMAL HIGH (ref 0.00–1.49)
Prothrombin Time: 21 seconds — ABNORMAL HIGH (ref 11.6–15.2)

## 2012-08-08 MED ORDER — WARFARIN SODIUM 4 MG PO TABS
4.0000 mg | ORAL_TABLET | Freq: Once | ORAL | Status: AC
  Administered 2012-08-08: 4 mg via ORAL
  Filled 2012-08-08: qty 1

## 2012-08-08 NOTE — Progress Notes (Signed)
CARE MANAGEMENT NOTE 08/08/2012  Patient:  Nicholas Lewis, Nicholas Lewis   Account Number:  [id number]  Date Initiated:  08/08/2012  Documentation initiated by:  Colleen Can  Subjective/Objective Assessment:   dx osteoarthritis left knee; total knee replacemnt on day of admission     Action/Plan:   CM spoke with patient. Plans are for patient to return to her home in [facility] where spouse will be caregiver. Pt states he will need RW and 3n1; Ordrs have been written for HHPT AND RN   Anticipated DC Date:  08/09/2012   Anticipated DC Plan:  HOME W HOME HEALTH SERVICES  In-house referral  Clinical Social Worker      DC Associate Professor  CM consult      [facility]  HOME HEALTH  DURABLE MEDICAL EQUIPMENT   Choice offered to / List presented to:  C-1 Patient   DME arranged  WALKER - ROLLING  3-N-1      DME agency  [facility]     HH arranged  HH-1 RN  HH-2 PT      [facility] agency  [facility]   Status of service:  In process, will continue to follow Medicare Important Message given?  NA - LOS <3 / Initial given by admissions (If response is "NO", the following Medicare IM given date fields will be blank) Date Medicare IM given:   Date Additional Medicare IM given:    Comments:  08/08/2012 Raynelle Bring BSN CCM [phone] [facility] DOES NOT HAVE AVAILABILITY OF [facility] SERVICES IN [facility] AT THIS TIME. [facility] CAN PROVIDE HH SERVICES WITH START DATE OF MONDAY-08/11/2012 FOR BLOOD DRWS AND PT.

## 2012-08-08 NOTE — Progress Notes (Signed)
Patient ID: Nicholas Lewis, male   DOB: 1941-06-19, 72 y.o.   MRN: [id number] POD 2--still has substantial pain.  Hgb satis @ 10.8.  Home in 1-2 days with dressing change before discharge.

## 2012-08-08 NOTE — Progress Notes (Signed)
Physical Therapy Treatment Patient Details Name: Nicholas Lewis MRN: [id number] DOB: 03-22-1941 Today's Date: 08/08/2012 Time: 4401-0272 PT Time Calculation (min): 13 min  PT Assessment / Plan / Recommendation Comments on Treatment Session  Progressing well.     Follow Up Recommendations  Home health PT    Barriers to Discharge        Equipment Recommendations  Rolling walker with 5" wheels    Recommendations for Other Services    Frequency 7X/week   Plan Discharge plan remains appropriate    Precautions / Restrictions Precautions Precautions: Knee Required Braces or Orthoses: Knee Immobilizer - Left Knee Immobilizer - Left: Discontinue once straight leg raise with < 10 degree lag Restrictions Weight Bearing Restrictions: No LLE Weight Bearing: Weight bearing as tolerated   Pertinent Vitals/Pain     Mobility  Bed Mobility Bed Mobility: Supine to Sit Supine to Sit: 4: Min assist;HOB elevated;With rails Details for Bed Mobility Assistance: Assist for L LE.  Transfers Transfers: Sit to Stand;Stand to Sit Sit to Stand: 4: Min assist;With upper extremity assist;From bed Stand to Sit: 4: Min guard;To chair/3-in-1;With upper extremity assist;With armrests Details for Transfer Assistance: VCs safety, hand placement. Assist to rise, stabilize.  Ambulation/Gait Ambulation/Gait Assistance: 4: Min guard Ambulation Distance (Feet): 125 Feet Assistive device: Rolling walker Ambulation/Gait Assistance Details: VCs safety, sequence. Slow gait speed.  Gait Pattern: Step-to pattern;Decreased stride length;Decreased step length - right;Decreased step length - left;Antalgic    Exercises     PT Diagnosis:    PT Problem List:   PT Treatment Interventions:     PT Goals Acute Rehab PT Goals Pt will go Sit to Stand: with supervision PT Goal: Sit to Stand - Progress: Progressing toward goal Pt will Ambulate: 51 - 150 feet;with supervision;with least restrictive assistive device PT  Goal: Ambulate - Progress: Goal set today  Visit Information  Last PT Received On: 08/08/12 Assistance Needed: +1    Subjective Data  Subjective: "Let's do it" Patient Stated Goal: Home   Cognition  Overall Cognitive Status: Appears within functional limits for tasks assessed/performed Arousal/Alertness: Awake/alert Orientation Level: Appears intact for tasks assessed Behavior During Session: [facility] for tasks performed    Balance     End of Session PT - End of Session Equipment Utilized During Treatment: Left knee immobilizer;Gait belt Activity Tolerance: Patient tolerated treatment well Patient left: in chair;with call bell/phone within reach CPM Left Knee CPM Left Knee: Off   GP     [name] [facility] 08/08/2012, 11:54 AM [phone]

## 2012-08-08 NOTE — Progress Notes (Signed)
Physical Therapy Treatment Patient Details Name: Nicholas Lewis MRN: [id number] DOB: 1941/07/06 Today's Date: 08/08/2012 Time: 1308-6578 PT Time Calculation (min): 32 min  PT Assessment / Plan / Recommendation Comments on Treatment Session  Plan is for home Sat.    Follow Up Recommendations  Home health PT    Barriers to Discharge        Equipment Recommendations  Rolling walker with 5" wheels    Recommendations for Other Services    Frequency 7X/week   Plan Discharge plan remains appropriate    Precautions / Restrictions Precautions Precautions: Knee Required Braces or Orthoses: Knee Immobilizer - Left Knee Immobilizer - Left: Discontinue once straight leg raise with < 10 degree lag Restrictions Weight Bearing Restrictions: No LLE Weight Bearing: Weight bearing as tolerated   Pertinent Vitals/Pain     Mobility  Bed Mobility Bed Mobility: Sit to Supine Sit to Supine: 4: Min assist Details for Bed Mobility Assistance: Assist for L LE onto bed Transfers Transfers: Sit to Stand;Stand to Sit Sit to Stand: 4: Min assist;With upper extremity assist;From chair/3-in-1 Stand to Sit: 4: Min guard;With upper extremity assist;With armrests;To chair/3-in-1 Details for Transfer Assistance: VCs safety, hand placment. Assist to rise, stabilize.  Ambulation/Gait Ambulation/Gait Assistance: 4: Min guard Ambulation Distance (Feet): 125 Feet Assistive device: Rolling walker Ambulation/Gait Assistance Details: VCs safety. Slow gait speed.  Gait Pattern: Step-to pattern;Decreased stride length;Decreased step length - right;Decreased step length - left Stairs: Yes Stairs Assistance: 4: Min assist Stairs Assistance Details (indicate cue type and reason): VCs safety, technique, sequence. Assist with RW.  Stair Management Technique: Backwards;With walker    Exercises Total Joint Exercises Ankle Circles/Pumps: AROM;Both;10 reps;Supine Quad Sets: AROM;Both;10  reps;Supine;Strengthening Short Arc QuadBarbaraann Lewis;Left;10 reps;Supine;Strengthening Heel Slides: AAROM;Strengthening;Left;10 reps;Supine Hip ABduction/ADduction: AAROM;Strengthening;Left;10 reps;Supine Straight Leg Raises: AAROM;Strengthening;Left;10 reps;Supine   PT Diagnosis:    PT Problem List:   PT Treatment Interventions:     PT Goals Acute Rehab PT Goals Pt will go Sit to Supine/Side: with supervision PT Goal: Sit to Supine/Side - Progress: Progressing toward goal Pt will go Sit to Stand: with supervision PT Goal: Sit to Stand - Progress: Progressing toward goal Pt will Ambulate: 51 - 150 feet;with supervision;with least restrictive assistive device PT Goal: Ambulate - Progress: Progressing toward goal Pt will Go Up / Down Stairs: 1-2 stairs;with min assist;with least restrictive assistive device PT Goal: Up/Down Stairs - Progress: Goal set today  Visit Information  Last PT Received On: 08/08/12 Assistance Needed: +1    Subjective Data  Subjective: "It's still sore" Patient Stated Goal: Home   Cognition  Overall Cognitive Status: Appears within functional limits for tasks assessed/performed Arousal/Alertness: Awake/alert Orientation Level: Appears intact for tasks assessed Behavior During Session: [facility] for tasks performed    Balance     End of Session PT - End of Session Equipment Utilized During Treatment: Left knee immobilizer;Gait belt Activity Tolerance: Patient tolerated treatment well Patient left: in bed;with call bell/phone within reach   GP     Nicholas Lewis 08/08/2012, 2:45 PM [phone]

## 2012-08-08 NOTE — Progress Notes (Signed)
ANTICOAGULATION CONSULT NOTE - Follow Up Consult  Pharmacy Consult for Warfarin Indication: VTE prophylaxis s/p left TKA on 08/06/12  Allergies  Allergen Reactions  . Sulfa Antibiotics     Reaction=dizziness and vision problems  . Mobic (Meloxicam) Rash    Patient Measurements: Height: 6' (182.9 cm) Weight: 230 lb (104.327 kg) IBW/kg (Calculated) : 77.6   Vital Signs: Temp: 98.2 F (36.8 C) (08/09 0653) Temp src: Oral (08/09 0653) BP: 110/66 mmHg (08/09 0653) Pulse Rate: 73  (08/09 0653)  Labs:  Basename 08/08/12 0415 08/07/12 0350  HGB 10.8* 12.6*  HCT 31.1* 36.1*  PLT -- --  APTT -- --  LABPROT 21.0* 14.3  INR 1.78* 1.09  HEPARINUNFRC -- --  CREATININE 1.03 0.91  CKTOTAL -- --  CKMB -- --  TROPONINI -- --    Estimated Creatinine Clearance: 83.3 ml/min (by C-G formula based on Cr of 1.03).   Medications:  Scheduled:     . allopurinol  300 mg Oral q morning - 10a  . hydrochlorothiazide  25 mg Oral q morning - 10a  . metoprolol  100 mg Oral BID  . warfarin  7.5 mg Oral ONCE-1800  . warfarin   Does not apply Once  . Warfarin - Pharmacist Dosing Inpatient   Does not apply q1800   Infusions:     . sodium chloride 20 mL/hr at 08/07/12 1900    Assessment:  71 yo M s/p L TKA on 08/06/12 with warfarin for post-op VTE prophylaxis (started 8/7pm).   INR with large increase to 1.78 this morning after 2 doses  Hgb decreased; No bleeding reported in chart.  Goal of Therapy:  INR 2-3   Plan:   Warfarin 4mg  PO at 1800 x1  Daily INR  Warfarin education to be completed prior to discharge.   Lynann Beaver PharmD, BCPS Pager [phone] 08/08/2012 10:14 AM

## 2012-08-09 DIAGNOSIS — Z96659 Presence of unspecified artificial knee joint: Secondary | ICD-10-CM

## 2012-08-09 LAB — BASIC METABOLIC PANEL
BUN: 15 mg/dL (ref 6–23)
CO2: 30 mEq/L (ref 19–32)
Calcium: 8.6 mg/dL (ref 8.4–10.5)
Chloride: 97 mEq/L (ref 96–112)
Creatinine, Ser: 0.96 mg/dL (ref 0.50–1.35)
GFR calc Af Amer: >90 mL/min (ref >90)
GFR calc non Af Amer: 82 mL/min — ABNORMAL LOW (ref >90)
Glucose, Bld: 121 mg/dL — ABNORMAL HIGH (ref 70–99)
Potassium: 3.2 mEq/L — ABNORMAL LOW (ref 3.5–5.1)
Sodium: 134 mEq/L — ABNORMAL LOW (ref 135–145)

## 2012-08-09 LAB — HEMOGLOBIN AND HEMATOCRIT, BLOOD
HCT: 29 % — ABNORMAL LOW (ref 39.0–52.0)
Hemoglobin: 10 g/dL — ABNORMAL LOW (ref 13.0–17.0)

## 2012-08-09 LAB — PROTIME-INR
INR: 2.99 — ABNORMAL HIGH (ref 0.00–1.49)
Prothrombin Time: 31.5 seconds — ABNORMAL HIGH (ref 11.6–15.2)

## 2012-08-09 MED ORDER — BISACODYL 5 MG PO TBEC: 5.0000 mg | DELAYED_RELEASE_TABLET | Freq: Every day | ORAL | Status: DC | PRN

## 2012-08-09 MED ORDER — WARFARIN SODIUM 1 MG PO TABS
1.0000 mg | ORAL_TABLET | Freq: Once | ORAL | Status: AC
  Administered 2012-08-09: 1 mg via ORAL
  Filled 2012-08-09: qty 1

## 2012-08-09 MED ORDER — ACETAMINOPHEN 325 MG PO TABS
650.0000 mg | ORAL_TABLET | Freq: Four times a day (QID) | ORAL | Status: DC | PRN
  Administered 2012-08-09 (×2): 650 mg via ORAL
  Filled 2012-08-09 (×2): qty 2

## 2012-08-09 MED ORDER — POTASSIUM CHLORIDE CRYS ER 20 MEQ PO TBCR
20.0000 meq | EXTENDED_RELEASE_TABLET | Freq: Two times a day (BID) | ORAL | Status: DC
  Administered 2012-08-09 – 2012-08-10 (×3): 20 meq via ORAL
  Filled 2012-08-09 (×6): qty 1

## 2012-08-09 MED ORDER — DOCUSATE SODIUM 100 MG PO CAPS
100.0000 mg | ORAL_CAPSULE | Freq: Two times a day (BID) | ORAL | Status: DC
  Administered 2012-08-09 – 2012-08-10 (×3): 100 mg via ORAL

## 2012-08-09 NOTE — Progress Notes (Signed)
Physical Therapy Treatment Patient Details Name: KYROS SALZWEDEL MRN: [id number] DOB: August 25, 1941 Today's Date: 08/09/2012 Time: 0950-1019 PT Time Calculation (min): 29 min  PT Assessment / Plan / Recommendation Comments on Treatment Session  Continuing to progress well. Plan is for home Sun.    Follow Up Recommendations  Home health PT    Barriers to Discharge        Equipment Recommendations       Recommendations for Other Services    Frequency 7X/week   Plan Discharge plan remains appropriate    Precautions / Restrictions Precautions Precautions: Knee Required Braces or Orthoses: Knee Immobilizer - Left Knee Immobilizer - Left: Discontinue once straight leg raise with < 10 degree lag Restrictions Weight Bearing Restrictions: No LLE Weight Bearing: Weight bearing as tolerated   Pertinent Vitals/Pain     Mobility  Bed Mobility Bed Mobility: Supine to Sit Supine to Sit: 4: Min assist Details for Bed Mobility Assistance: Assist for L LE. Transfers Transfers: Sit to Stand;Stand to Sit Sit to Stand: 4: Min guard;With upper extremity assist;From bed Stand to Sit: 4: Min guard;With upper extremity assist;To chair/3-in-1 Ambulation/Gait Ambulation/Gait Assistance: 4: Min guard Ambulation Distance (Feet): 150 Feet Assistive device: Rolling walker Ambulation/Gait Assistance Details: Slow steady gait speed.  Gait Pattern: Step-to pattern;Antalgic;Decreased stride length;Decreased step length - right;Decreased step length - left    Exercises Total Joint Exercises Ankle Circles/Pumps: AROM;Both;10 reps;Supine Quad Sets: AROM;Both;Strengthening;10 reps;Supine Short Arc Quad: AAROM;Strengthening;Left;Supine Heel Slides: AAROM;Strengthening;Left;Supine Hip ABduction/ADduction: AAROM;Strengthening;Left;Supine Straight Leg Raises: AAROM;Strengthening;Left;Supine   PT Diagnosis:    PT Problem List:   PT Treatment Interventions:     PT Goals Acute Rehab PT Goals Pt will go  Supine/Side to Sit: with supervision PT Goal: Supine/Side to Sit - Progress: Progressing toward goal Pt will go Sit to Stand: with supervision PT Goal: Sit to Stand - Progress: Progressing toward goal Pt will Ambulate: 51 - 150 feet;with supervision;with least restrictive assistive device PT Goal: Ambulate - Progress: Progressing toward goal  Visit Information  Last PT Received On: 08/09/12 Assistance Needed: +1    Subjective Data  Subjective: "I'm trying to cut back on pain meds" Patient Stated Goal: Home   Cognition  Overall Cognitive Status: Appears within functional limits for tasks assessed/performed Arousal/Alertness: Awake/alert Orientation Level: Appears intact for tasks assessed Behavior During Session: [facility] for tasks performed    Balance     End of Session PT - End of Session Equipment Utilized During Treatment: Gait belt;Left knee immobilizer Activity Tolerance: Patient tolerated treatment well Patient left: in chair;with call bell/phone within reach   GP     [name] [facility] 08/09/2012, 10:24 AM [phone]

## 2012-08-09 NOTE — Progress Notes (Signed)
Subjective: Patient feeling better today but still feels weak no shortness of breath chest pain or productive cough    Objective: Vital signs in last 24 hours: Temp:  [98.9 F (37.2 C)-99.3 F (37.4 C)] 99.3 F (37.4 C) (08/10 0503) Pulse Rate:  [77-98] 77  (08/10 0503) Resp:  [14-16] 14  (08/10 0503) BP: (96-116)/(62-74) 101/64 mmHg (08/10 0503) SpO2:  [95 %-99 %] 96 % (08/10 0503)  Intake/Output from previous day: 08/09 0701 - 08/10 0700 In: 1249.7 [P.O.:1080; I.V.:169.7] Out: 1725 [Urine:1725] Intake/Output this shift:     Basename 08/09/12 0546 08/08/12 0415 08/07/12 0350  HGB 10.0* 10.8* 12.6*    Basename 08/09/12 0546 08/08/12 0415  WBC -- --  RBC -- --  HCT 29.0* 31.1*  PLT -- --    Basename 08/09/12 0546 08/08/12 0415  NA 134* 135  K 3.2* 3.8  CL 97 100  CO2 30 30  BUN 15 13  CREATININE 0.96 1.03  GLUCOSE 121* 110*  CALCIUM 8.6 8.4    Basename 08/09/12 0546 08/08/12 0415  LABPT -- --  INR 2.99* 1.78*    Patient sitting up in bed conscious alert appropriate appears to be in no distress. Left lower extremity dressing was taken down her wound is well approximated with staples he has no pressure blisters no signs of infection no drainage his calf was soft and nontender no signs with by this his foot neuromotor vascularly intact his left thigh was little bit sore but it was soft  Assessment/Plan: Postop day #3 status post left total knee arthroplasty stable. A little slow progress with physical therapy. Hyponatremia asymptomatic still on IV fluids will DC IV fluids. Hypokalemia asymptomatic to give potassium by mouth today. Coumadin therapy INR of 2.99. Pharmacy to adjust dose  Plan patient's dressing was changed today will DC IV fluids. Give by mouth potassium. Pharmacy to adjust Coumadin dose and. Continue with physical therapy. Laxative or enema of choice today. Continue with physical therapy and will probably discharge home on Sunday   Nicholas Lewis  W 08/09/2012, 7:48 AM

## 2012-08-09 NOTE — Progress Notes (Signed)
Physical Therapy Treatment Patient Details Name: Nicholas Lewis MRN: [id number] DOB: [birth year] Today's Date: 08/09/2012 Time: 4098-1191 PT Time Calculation (min): 24 min  PT Assessment / Plan / Recommendation Comments on Treatment Session  Continuing to mobilize well. Possible d/c home on Sun. Pt would like to practice steps again with wife present.     Follow Up Recommendations  Home health PT    Barriers to Discharge        Equipment Recommendations  Rolling walker with 5" wheels    Recommendations for Other Services    Frequency 7X/week   Plan Discharge plan remains appropriate    Precautions / Restrictions Precautions Precautions: Knee Required Braces or Orthoses: Knee Immobilizer - Left Knee Immobilizer - Left: Discontinue once straight leg raise with < 10 degree lag Restrictions Weight Bearing Restrictions: No LLE Weight Bearing: Weight bearing as tolerated   Pertinent Vitals/Pain     Mobility  Bed Mobility Bed Mobility: Sit to Supine Sit to Supine: 4: Min assist Details for Bed Mobility Assistance: Assist for L LE onto bed.  Transfers Transfers: Stand to Sit;Sit to Stand Sit to Stand: 4: Min guard;With armrests;From chair/3-in-1 Stand to Sit: 4: Min guard;With upper extremity assist;To bed Details for Transfer Assistance: VCs safety, hand placement, technique.  Ambulation/Gait Ambulation/Gait Assistance: 4: Min guard Ambulation Distance (Feet): 160 Feet Assistive device: Rolling walker Ambulation/Gait Assistance Details: slow steady gait speed.  Gait Pattern: Step-to pattern;Decreased stride length;Decreased step length - right;Decreased step length - left    Exercises    PT Diagnosis:    PT Problem List:   PT Treatment Interventions:     PT Goals Acute Rehab PT Goals Pt will go Supine/Side to Sit: with supervision PT Goal: Supine/Side to Sit - Progress: Progressing toward goal Pt will go Sit to Supine/Side: with supervision PT Goal: Sit to  Supine/Side - Progress: Progressing toward goal Pt will go Sit to Stand: with supervision PT Goal: Sit to Stand - Progress: Progressing toward goal Pt will Ambulate: 51 - 150 feet;with supervision;with least restrictive assistive device PT Goal: Ambulate - Progress: Progressing toward goal  Visit Information  Last PT Received On: 08/09/12 Assistance Needed: +1    Subjective Data  Subjective: "I'm trying to see what is making me so sleepy" Patient Stated Goal: Home   Cognition  Overall Cognitive Status: Appears within functional limits for tasks assessed/performed Arousal/Alertness: Awake/alert Orientation Level: Appears intact for tasks assessed Behavior During Session: [facility] for tasks performed    Balance     End of Session PT - End of Session Equipment Utilized During Treatment: Left knee immobilizer Activity Tolerance: Patient tolerated treatment well Patient left: with call bell/phone within reach;in bed   GP     [name] [facility] 08/09/2012, 1:59 PM [phone]

## 2012-08-09 NOTE — Progress Notes (Signed)
ANTICOAGULATION CONSULT NOTE - Follow Up Consult  Pharmacy Consult for Warfarin Indication: VTE prophylaxis s/p left TKA on 08/06/12  Allergies  Allergen Reactions  . Sulfa Antibiotics     Reaction=dizziness and vision problems  . Mobic (Meloxicam) Rash    Patient Measurements: Height: 6' (182.9 cm) Weight: 230 lb (104.327 kg) IBW/kg (Calculated) : 77.6   Vital Signs: Temp: 99.5 F (37.5 C) (08/10 0958) Temp src: Oral (08/10 0958) BP: 119/71 mmHg (08/10 0958) Pulse Rate: 97  (08/10 0958)  Labs:  Basename 08/09/12 0546 08/08/12 0415 08/07/12 0350  HGB 10.0* 10.8* --  HCT 29.0* 31.1* 36.1*  PLT -- -- --  APTT -- -- --  LABPROT 31.5* 21.0* 14.3  INR 2.99* 1.78* 1.09  HEPARINUNFRC -- -- --  CREATININE 0.96 1.03 0.91  CKTOTAL -- -- --  CKMB -- -- --  TROPONINI -- -- --    Estimated Creatinine Clearance: 89.4 ml/min (by C-G formula based on Cr of 0.96).   Medications:  Scheduled:     . allopurinol  300 mg Oral q morning - 10a  . docusate sodium  100 mg Oral BID  . hydrochlorothiazide  25 mg Oral q morning - 10a  . metoprolol  100 mg Oral BID  . potassium chloride  20 mEq Oral BID  . warfarin  4 mg Oral ONCE-1800  . Warfarin - Pharmacist Dosing Inpatient   Does not apply q1800   Infusions:     . DISCONTD: sodium chloride 20 mL/hr (08/08/12 1307)    Assessment:  71 yo M s/p L TKA on 08/06/12 with warfarin for post-op VTE prophylaxis (started 8/7pm).   INR with large increase to 2.99 today after 3 doses of warfarin  Hgb slightly decreased; No bleeding reported in chart.  Warfarin education completed 8/9;  Pt instructed not to take [facility] with warfarin  Goal of Therapy:  INR 2-3   Plan:   Warfarin, low dose, 1mg  PO at 1800 x1  Daily INR   Lynann Beaver PharmD, BCPS Pager [phone] 08/09/2012 1:51 PM

## 2012-08-10 LAB — PROTIME-INR
INR: 2.49 — ABNORMAL HIGH (ref 0.00–1.49)
Prothrombin Time: 27.3 seconds — ABNORMAL HIGH (ref 11.6–15.2)

## 2012-08-10 LAB — HEMOGLOBIN AND HEMATOCRIT, BLOOD
HCT: 28.9 % — ABNORMAL LOW (ref 39.0–52.0)
Hemoglobin: 10.3 g/dL — ABNORMAL LOW (ref 13.0–17.0)

## 2012-08-10 MED ORDER — FLEET ENEMA 7-19 GM/118ML RE ENEM: 1.0000 | ENEMA | Freq: Every day | RECTAL | Status: DC | PRN

## 2012-08-10 MED ORDER — DSS 100 MG PO CAPS: 100.0000 mg | ORAL_CAPSULE | Freq: Two times a day (BID) | ORAL | Status: AC

## 2012-08-10 MED ORDER — ACETAMINOPHEN 325 MG PO TABS: 650.0000 mg | ORAL_TABLET | Freq: Four times a day (QID) | ORAL | Status: AC | PRN

## 2012-08-10 MED ORDER — BISACODYL 10 MG RE SUPP
10.0000 mg | Freq: Every day | RECTAL | Status: DC | PRN
  Administered 2012-08-10: 10 mg via RECTAL
  Filled 2012-08-10: qty 1

## 2012-08-10 MED ORDER — METHOCARBAMOL 500 MG PO TABS: 500.0000 mg | ORAL_TABLET | Freq: Four times a day (QID) | ORAL | Status: AC | PRN

## 2012-08-10 MED ORDER — HYDROCODONE-ACETAMINOPHEN 5-325 MG PO TABS: 1.0000 | ORAL_TABLET | ORAL | Status: AC | PRN

## 2012-08-10 MED ORDER — POLYETHYLENE GLYCOL 3350 17 G PO PACK: 17.0000 g | PACK | Freq: Two times a day (BID) | ORAL | Status: AC

## 2012-08-10 MED ORDER — RIVAROXABAN 10 MG PO TABS: 10.0000 mg | ORAL_TABLET | Freq: Every day | ORAL | Status: DC

## 2012-08-10 NOTE — Progress Notes (Signed)
Physical Therapy Treatment Patient Details Name: BRAVE DACK MRN: [id number] DOB: 05/05/1941 Today's Date: 08/10/2012 Time: 0802-0827 PT Time Calculation (min): 25 min  PT Assessment / Plan / Recommendation Comments on Treatment Session  Pt continues to progress well with ambulation and stair negotiation.  Ready for D/c.     Follow Up Recommendations  Home health PT    Barriers to Discharge        Equipment Recommendations  Rolling walker with 5" wheels    Recommendations for Other Services    Frequency 7X/week   Plan Discharge plan remains appropriate    Precautions / Restrictions Precautions Precautions: Knee Required Braces or Orthoses: Knee Immobilizer - Left Knee Immobilizer - Left: Discontinue once straight leg raise with < 10 degree lag Restrictions Weight Bearing Restrictions: No LLE Weight Bearing: Weight bearing as tolerated   Pertinent Vitals/Pain 5/10    Mobility  Bed Mobility Bed Mobility: Not assessed Transfers Transfers: Stand to Sit;Sit to Stand Sit to Stand: 5: Supervision;From elevated surface;With upper extremity assist;With armrests;From chair/3-in-1 Stand to Sit: 5: Supervision;With upper extremity assist;To bed Details for Transfer Assistance: Min cues for hand placement and LE management.  Ambulation/Gait Ambulation/Gait Assistance: 4: Min guard Ambulation Distance (Feet): 60 Feet Assistive device: Rolling walker Ambulation/Gait Assistance Details: Min cues for relaxed and upright posture.  Gait Pattern: Step-to pattern;Decreased stride length;Decreased step length - right;Decreased step length - left Stairs: Yes Stairs Assistance: 4: Min guard Stairs Assistance Details (indicate cue type and reason): Min cues for hand placement on RW when going up stairs. Had pt educate wife on stair training and where her position should be to assist.  Stair Management Technique: Backwards;With walker Number of Stairs: 2     Exercises     PT  Diagnosis:    PT Problem List:   PT Treatment Interventions:     PT Goals Acute Rehab PT Goals PT Goal Formulation: With patient Time For Goal Achievement: 08/14/12 Potential to Achieve Goals: Good Pt will go Sit to Stand: with supervision PT Goal: Sit to Stand - Progress: Met Pt will Ambulate: 51 - 150 feet;with supervision;with least restrictive assistive device PT Goal: Ambulate - Progress: Progressing toward goal Pt will Go Up / Down Stairs: 1-2 stairs;with min assist;with least restrictive assistive device PT Goal: Up/Down Stairs - Progress: Met  Visit Information  Last PT Received On: 08/10/12 Assistance Needed: +1    Subjective Data  Subjective: I got a little hot when I was coming out of the bathroom.  Patient Stated Goal: Home   Cognition  Overall Cognitive Status: Appears within functional limits for tasks assessed/performed Arousal/Alertness: Awake/alert Orientation Level: Appears intact for tasks assessed Behavior During Session: [facility] for tasks performed    Balance     End of Session PT - End of Session Equipment Utilized During Treatment: Left knee immobilizer Activity Tolerance: Other (comment) (Limited by some nausea) Patient left: in bed;with call bell/phone within reach;with family/visitor present Nurse Communication: Mobility status CPM Left Knee CPM Left Knee: Off   GP     Page, Meribeth Mattes 08/10/2012, 8:34 AM

## 2012-08-10 NOTE — Progress Notes (Addendum)
   Subjective: 4 Days Post-Op Procedure(s) (LRB): TOTAL KNEE ARTHROPLASTY (Left)   Patient reports pain as mild, pain well controlled. States he feels better not taking so much Percocet, which he states knocked him out. Also states that he has had gas, but no BM yet. Otherwise he feels that he is ready to be discharged home.  Objective:   VITALS:   Filed Vitals:   08/10/12 0519  BP: 126/72  Pulse: 83  Temp: 99.3 F (37.4 C)  Resp: 16    Neurovascular intact Dorsiflexion/Plantar flexion intact Incision: dressing C/D/I No cellulitis present Compartment soft  LABS  Basename 08/10/12 0504 08/09/12 0546 08/08/12 0415  HGB 10.3* 10.0* 10.8*  HCT 28.9* 29.0* 31.1*  WBC -- -- --  PLT -- -- --     Basename 08/09/12 0546 08/08/12 0415  NA 134* 135  K 3.2* 3.8  BUN 15 13  CREATININE 0.96 1.03  GLUCOSE 121* 110*     Assessment/Plan: 4 Days Post-Op Procedure(s) (LRB): TOTAL KNEE ARTHROPLASTY (Left)   Up with therapy Discharge home with home health Follow [facility] in 2 weeks at [facility].  Follow-up Information    Follow up with Dr. Simonne Come in 2 weeks.   Contact information:   [facility] [address], [address] [place] [address] [phone]             Anastasio Auerbach. Senna Lape   PAC  08/10/2012, 8:32 AM

## 2012-08-10 NOTE — Progress Notes (Signed)
Pt stable, scripts, d/c instructions given with questions/concerns voiced.  Pt transported via wheelchair to private vehicle with NT and wife.

## 2012-08-10 NOTE — Progress Notes (Signed)
ANTICOAGULATION CONSULT NOTE - Follow Up Consult  Pharmacy Consult for Warfarin Indication: VTE prophylaxis s/p left TKA on 08/06/12  Allergies  Allergen Reactions  . Sulfa Antibiotics     Reaction=dizziness and vision problems  . Mobic (Meloxicam) Rash    Patient Measurements: Height: 6' (182.9 cm) Weight: 230 lb (104.327 kg) IBW/kg (Calculated) : 77.6   Vital Signs: Temp: 99.3 F (37.4 C) (08/11 0519) Temp src: Oral (08/11 0519) BP: 126/72 mmHg (08/11 0519) Pulse Rate: 83  (08/11 0519)  Labs:  Basename 08/10/12 0504 08/09/12 0546 08/08/12 0415  HGB 10.3* 10.0* --  HCT 28.9* 29.0* 31.1*  PLT -- -- --  APTT -- -- --  LABPROT 27.3* 31.5* 21.0*  INR 2.49* 2.99* 1.78*  HEPARINUNFRC -- -- --  CREATININE -- 0.96 1.03  CKTOTAL -- -- --  CKMB -- -- --  TROPONINI -- -- --    Estimated Creatinine Clearance: 89.4 ml/min (by C-G formula based on Cr of 0.96).   Medications:  Scheduled:     . allopurinol  300 mg Oral q morning - 10a  . docusate sodium  100 mg Oral BID  . hydrochlorothiazide  25 mg Oral q morning - 10a  . metoprolol  100 mg Oral BID  . potassium chloride  20 mEq Oral BID  . warfarin  1 mg Oral ONCE-1800  . Warfarin - Pharmacist Dosing Inpatient   Does not apply q1800    Assessment:  71 yo M s/p L TKA on 08/06/12 with warfarin for post-op VTE prophylaxis (started 8/7pm).   INR therapeutic; No bleeding reported in chart.  Warfarin education completed 8/9;  Pt instructed not to take [facility] with warfarin  Per notes, planning to use Xarelto, NOT warfarin at discharge today.  Renal function appropriate with CrCl ~ 89 ml/min  Goal of Therapy:  Appropriate Xarelto dosing.   Plan:   Recommend Xarelto 10mg  PO daily, first dose 8/11   Lynann Beaver PharmD, BCPS Pager [phone] 08/10/2012 8:50 AM

## 2012-08-11 DIAGNOSIS — M159 Polyosteoarthritis, unspecified: Secondary | ICD-10-CM | POA: Diagnosis not present

## 2012-08-11 DIAGNOSIS — Z471 Aftercare following joint replacement surgery: Secondary | ICD-10-CM | POA: Diagnosis not present

## 2012-08-11 DIAGNOSIS — M109 Gout, unspecified: Secondary | ICD-10-CM | POA: Diagnosis not present

## 2012-08-11 DIAGNOSIS — Z96659 Presence of unspecified artificial knee joint: Secondary | ICD-10-CM | POA: Diagnosis not present

## 2012-08-11 DIAGNOSIS — I10 Essential (primary) hypertension: Secondary | ICD-10-CM | POA: Diagnosis not present

## 2012-08-12 DIAGNOSIS — I10 Essential (primary) hypertension: Secondary | ICD-10-CM | POA: Diagnosis not present

## 2012-08-12 DIAGNOSIS — M159 Polyosteoarthritis, unspecified: Secondary | ICD-10-CM | POA: Diagnosis not present

## 2012-08-12 DIAGNOSIS — M109 Gout, unspecified: Secondary | ICD-10-CM | POA: Diagnosis not present

## 2012-08-12 DIAGNOSIS — Z96659 Presence of unspecified artificial knee joint: Secondary | ICD-10-CM | POA: Diagnosis not present

## 2012-08-12 DIAGNOSIS — Z471 Aftercare following joint replacement surgery: Secondary | ICD-10-CM | POA: Diagnosis not present

## 2012-08-13 DIAGNOSIS — Z471 Aftercare following joint replacement surgery: Secondary | ICD-10-CM | POA: Diagnosis not present

## 2012-08-13 DIAGNOSIS — M159 Polyosteoarthritis, unspecified: Secondary | ICD-10-CM | POA: Diagnosis not present

## 2012-08-13 DIAGNOSIS — I10 Essential (primary) hypertension: Secondary | ICD-10-CM | POA: Diagnosis not present

## 2012-08-13 DIAGNOSIS — M109 Gout, unspecified: Secondary | ICD-10-CM | POA: Diagnosis not present

## 2012-08-13 DIAGNOSIS — Z96659 Presence of unspecified artificial knee joint: Secondary | ICD-10-CM | POA: Diagnosis not present

## 2012-08-13 NOTE — Discharge Summary (Signed)
[name], [name] NO.:  [id number]  MEDICAL RECORD NO.:  [id number]  LOCATION:  1618                         FACILITY:  [facility]  PHYSICIAN:  Marlowe Kays, M.D.  DATE OF BIRTH:  [birth year]  DATE OF ADMISSION:  08/06/2012 DATE OF DISCHARGE:  08/10/2012                              DISCHARGE SUMMARY   ADMITTING DIAGNOSIS:  End-stage osteoarthritis, left knee.  DISCHARGE DIAGNOSIS:  End-stage osteoarthritis, left knee.  SUMMARY:  I have followed Mr. Stuhr for number of years with progressive pain and deformity in his left knee.  We tried nonsurgical treatment with Euflexxa and anti-inflammatory medications without any sustained relief.  Consequently, he is here today for the first surgical correction.  On the day of admission, I performed an Osteonics, total knee replacement in his left knee.  The operation proceeded uneventfully without complications.  Postop x-rays were excellent.  At discharge, he was discharged on appropriate pain medication and muscle relaxant and Xarelto.  To be followed in my office 2 weeks from surgery for removal of staples.  CONDITION AT DISCHARGE:  Stable and improved.          ______________________________ Marlowe Kays, M.D.     JA/MEDQ  D:  08/12/2012  T:  08/13/2012  Job:  [id number]

## 2012-08-14 DIAGNOSIS — M159 Polyosteoarthritis, unspecified: Secondary | ICD-10-CM | POA: Diagnosis not present

## 2012-08-14 DIAGNOSIS — Z96659 Presence of unspecified artificial knee joint: Secondary | ICD-10-CM | POA: Diagnosis not present

## 2012-08-14 DIAGNOSIS — I10 Essential (primary) hypertension: Secondary | ICD-10-CM | POA: Diagnosis not present

## 2012-08-14 DIAGNOSIS — M109 Gout, unspecified: Secondary | ICD-10-CM | POA: Diagnosis not present

## 2012-08-14 DIAGNOSIS — Z471 Aftercare following joint replacement surgery: Secondary | ICD-10-CM | POA: Diagnosis not present

## 2012-08-15 DIAGNOSIS — I10 Essential (primary) hypertension: Secondary | ICD-10-CM | POA: Diagnosis not present

## 2012-08-15 DIAGNOSIS — Z471 Aftercare following joint replacement surgery: Secondary | ICD-10-CM | POA: Diagnosis not present

## 2012-08-15 DIAGNOSIS — M109 Gout, unspecified: Secondary | ICD-10-CM | POA: Diagnosis not present

## 2012-08-15 DIAGNOSIS — M159 Polyosteoarthritis, unspecified: Secondary | ICD-10-CM | POA: Diagnosis not present

## 2012-08-15 DIAGNOSIS — Z96659 Presence of unspecified artificial knee joint: Secondary | ICD-10-CM | POA: Diagnosis not present

## 2012-08-18 DIAGNOSIS — M109 Gout, unspecified: Secondary | ICD-10-CM | POA: Diagnosis not present

## 2012-08-18 DIAGNOSIS — M159 Polyosteoarthritis, unspecified: Secondary | ICD-10-CM | POA: Diagnosis not present

## 2012-08-18 DIAGNOSIS — Z471 Aftercare following joint replacement surgery: Secondary | ICD-10-CM | POA: Diagnosis not present

## 2012-08-18 DIAGNOSIS — I10 Essential (primary) hypertension: Secondary | ICD-10-CM | POA: Diagnosis not present

## 2012-08-18 DIAGNOSIS — Z96659 Presence of unspecified artificial knee joint: Secondary | ICD-10-CM | POA: Diagnosis not present

## 2012-08-19 DIAGNOSIS — I10 Essential (primary) hypertension: Secondary | ICD-10-CM | POA: Diagnosis not present

## 2012-08-19 DIAGNOSIS — Z471 Aftercare following joint replacement surgery: Secondary | ICD-10-CM | POA: Diagnosis not present

## 2012-08-19 DIAGNOSIS — Z96659 Presence of unspecified artificial knee joint: Secondary | ICD-10-CM | POA: Diagnosis not present

## 2012-08-19 DIAGNOSIS — M109 Gout, unspecified: Secondary | ICD-10-CM | POA: Diagnosis not present

## 2012-08-19 DIAGNOSIS — M159 Polyosteoarthritis, unspecified: Secondary | ICD-10-CM | POA: Diagnosis not present

## 2012-08-20 DIAGNOSIS — M109 Gout, unspecified: Secondary | ICD-10-CM | POA: Diagnosis not present

## 2012-08-20 DIAGNOSIS — Z471 Aftercare following joint replacement surgery: Secondary | ICD-10-CM | POA: Diagnosis not present

## 2012-08-20 DIAGNOSIS — M159 Polyosteoarthritis, unspecified: Secondary | ICD-10-CM | POA: Diagnosis not present

## 2012-08-20 DIAGNOSIS — I10 Essential (primary) hypertension: Secondary | ICD-10-CM | POA: Diagnosis not present

## 2012-08-20 DIAGNOSIS — Z96659 Presence of unspecified artificial knee joint: Secondary | ICD-10-CM | POA: Diagnosis not present

## 2012-08-21 DIAGNOSIS — Z5189 Encounter for other specified aftercare: Secondary | ICD-10-CM | POA: Diagnosis not present

## 2012-08-22 DIAGNOSIS — I10 Essential (primary) hypertension: Secondary | ICD-10-CM | POA: Diagnosis not present

## 2012-08-22 DIAGNOSIS — M109 Gout, unspecified: Secondary | ICD-10-CM | POA: Diagnosis not present

## 2012-08-22 DIAGNOSIS — Z96659 Presence of unspecified artificial knee joint: Secondary | ICD-10-CM | POA: Diagnosis not present

## 2012-08-22 DIAGNOSIS — M159 Polyosteoarthritis, unspecified: Secondary | ICD-10-CM | POA: Diagnosis not present

## 2012-08-22 DIAGNOSIS — Z471 Aftercare following joint replacement surgery: Secondary | ICD-10-CM | POA: Diagnosis not present

## 2012-08-25 DIAGNOSIS — M109 Gout, unspecified: Secondary | ICD-10-CM | POA: Diagnosis not present

## 2012-08-25 DIAGNOSIS — Z96659 Presence of unspecified artificial knee joint: Secondary | ICD-10-CM | POA: Diagnosis not present

## 2012-08-25 DIAGNOSIS — I10 Essential (primary) hypertension: Secondary | ICD-10-CM | POA: Diagnosis not present

## 2012-08-25 DIAGNOSIS — M159 Polyosteoarthritis, unspecified: Secondary | ICD-10-CM | POA: Diagnosis not present

## 2012-08-25 DIAGNOSIS — Z471 Aftercare following joint replacement surgery: Secondary | ICD-10-CM | POA: Diagnosis not present

## 2012-08-27 DIAGNOSIS — I10 Essential (primary) hypertension: Secondary | ICD-10-CM | POA: Diagnosis not present

## 2012-08-27 DIAGNOSIS — Z471 Aftercare following joint replacement surgery: Secondary | ICD-10-CM | POA: Diagnosis not present

## 2012-08-27 DIAGNOSIS — Z96659 Presence of unspecified artificial knee joint: Secondary | ICD-10-CM | POA: Diagnosis not present

## 2012-08-27 DIAGNOSIS — M109 Gout, unspecified: Secondary | ICD-10-CM | POA: Diagnosis not present

## 2012-08-27 DIAGNOSIS — M159 Polyosteoarthritis, unspecified: Secondary | ICD-10-CM | POA: Diagnosis not present

## 2012-08-28 DIAGNOSIS — Z471 Aftercare following joint replacement surgery: Secondary | ICD-10-CM | POA: Diagnosis not present

## 2012-08-28 DIAGNOSIS — I10 Essential (primary) hypertension: Secondary | ICD-10-CM | POA: Diagnosis not present

## 2012-08-28 DIAGNOSIS — Z96659 Presence of unspecified artificial knee joint: Secondary | ICD-10-CM | POA: Diagnosis not present

## 2012-08-28 DIAGNOSIS — M159 Polyosteoarthritis, unspecified: Secondary | ICD-10-CM | POA: Diagnosis not present

## 2012-08-28 DIAGNOSIS — M109 Gout, unspecified: Secondary | ICD-10-CM | POA: Diagnosis not present

## 2012-08-29 DIAGNOSIS — I10 Essential (primary) hypertension: Secondary | ICD-10-CM | POA: Diagnosis not present

## 2012-08-29 DIAGNOSIS — M109 Gout, unspecified: Secondary | ICD-10-CM | POA: Diagnosis not present

## 2012-08-29 DIAGNOSIS — Z471 Aftercare following joint replacement surgery: Secondary | ICD-10-CM | POA: Diagnosis not present

## 2012-08-29 DIAGNOSIS — M159 Polyosteoarthritis, unspecified: Secondary | ICD-10-CM | POA: Diagnosis not present

## 2012-08-29 DIAGNOSIS — Z96659 Presence of unspecified artificial knee joint: Secondary | ICD-10-CM | POA: Diagnosis not present

## 2012-09-01 DIAGNOSIS — M109 Gout, unspecified: Secondary | ICD-10-CM | POA: Diagnosis not present

## 2012-09-01 DIAGNOSIS — Z471 Aftercare following joint replacement surgery: Secondary | ICD-10-CM | POA: Diagnosis not present

## 2012-09-01 DIAGNOSIS — M159 Polyosteoarthritis, unspecified: Secondary | ICD-10-CM | POA: Diagnosis not present

## 2012-09-01 DIAGNOSIS — Z96659 Presence of unspecified artificial knee joint: Secondary | ICD-10-CM | POA: Diagnosis not present

## 2012-09-01 DIAGNOSIS — I10 Essential (primary) hypertension: Secondary | ICD-10-CM | POA: Diagnosis not present

## 2012-09-03 DIAGNOSIS — M159 Polyosteoarthritis, unspecified: Secondary | ICD-10-CM | POA: Diagnosis not present

## 2012-09-03 DIAGNOSIS — I10 Essential (primary) hypertension: Secondary | ICD-10-CM | POA: Diagnosis not present

## 2012-09-03 DIAGNOSIS — Z471 Aftercare following joint replacement surgery: Secondary | ICD-10-CM | POA: Diagnosis not present

## 2012-09-03 DIAGNOSIS — Z96659 Presence of unspecified artificial knee joint: Secondary | ICD-10-CM | POA: Diagnosis not present

## 2012-09-03 DIAGNOSIS — M109 Gout, unspecified: Secondary | ICD-10-CM | POA: Diagnosis not present

## 2012-09-05 DIAGNOSIS — I10 Essential (primary) hypertension: Secondary | ICD-10-CM | POA: Diagnosis not present

## 2012-09-05 DIAGNOSIS — M109 Gout, unspecified: Secondary | ICD-10-CM | POA: Diagnosis not present

## 2012-09-05 DIAGNOSIS — Z471 Aftercare following joint replacement surgery: Secondary | ICD-10-CM | POA: Diagnosis not present

## 2012-09-05 DIAGNOSIS — M159 Polyosteoarthritis, unspecified: Secondary | ICD-10-CM | POA: Diagnosis not present

## 2012-09-05 DIAGNOSIS — Z96659 Presence of unspecified artificial knee joint: Secondary | ICD-10-CM | POA: Diagnosis not present

## 2012-09-09 ENCOUNTER — Ambulatory Visit (HOSPITAL_COMMUNITY)
Admission: RE | Admit: 2012-09-09 | Discharge: 2012-09-09 | Disposition: A | Discharge status: Home / Self Care | Payer: Medicare Other | Source: Ambulatory Visit | Attending: Orthopedic Surgery | Admitting: Orthopedic Surgery

## 2012-09-09 DIAGNOSIS — IMO0001 Reserved for inherently not codable concepts without codable children: Secondary | ICD-10-CM | POA: Diagnosis not present

## 2012-09-09 DIAGNOSIS — M25669 Stiffness of unspecified knee, not elsewhere classified: Secondary | ICD-10-CM | POA: Diagnosis not present

## 2012-09-09 DIAGNOSIS — M25569 Pain in unspecified knee: Secondary | ICD-10-CM | POA: Diagnosis not present

## 2012-09-09 DIAGNOSIS — M6281 Muscle weakness (generalized): Secondary | ICD-10-CM | POA: Diagnosis not present

## 2012-09-09 DIAGNOSIS — Z96659 Presence of unspecified artificial knee joint: Secondary | ICD-10-CM

## 2012-09-09 NOTE — Evaluation (Signed)
Physical Therapy Evaluation  Patient Details  Name: Nicholas Lewis MRN: [id number] Date of Birth: May 10, 1941  Today's Date: 09/09/2012 Time: 4098-1191 PT Time Calculation (min): 40 min Charges: 1 eval Visit#: 1  of 8   Re-eval: 10/09/12 Assessment Diagnosis: L TKR Surgical Date: 08/06/12 Next MD Visit: Dr. Gerrianne Scale - October Prior Therapy: HHPT  Authorization: MEDICARE  Authorization Time Period: Initail  Mobility  Authorization Visit#: 1  of 8    Past Medical History:  Past Medical History  Diagnosis Date  . Complication of anesthesia     slow to wake up  . Hypertension   . Arthritis   . Gout   . Abrasion     l arm  . Cancer     skin  . Sleep apnea     stop bang score 4   Past Surgical History:  Past Surgical History  Procedure Date  . Knee arthroscopy 2000    left  . Cystectomy 2003    from spine  . Total knee arthroplasty 08/06/2012    Procedure: TOTAL KNEE ARTHROPLASTY;  Surgeon: Drucilla Schmidt, MD;  Location: [facility];  Service: Orthopedics;  Laterality: Left;   Subjective Symptoms/Limitations Symptoms: PMH: conservative treatment for knee pain years, HTN ( controlled w/meds), gout (medications) Pertinent History: Pt is referred to PT secondary to Lt TKR which was replaced due to OA.  He reports that he has only been having pain when stretching.  He states he was on antibiotics for cellulitis when he came home from the hosptial. He reports with HHPT (4 weeks) he was able to achieve 0-100 degrees.  His c/co's are difficulty going up and down stairs, increased weakness, achiness with standing.  He reports he was cutting hay for 6.5 hours last night and needed to take Tylenol.  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 Comments: Enjoys working on his farm   Sensation/Coordination/Flexibility/Functional Tests Functional Tests Functional Tests: 5 STS: 10 sec  Assessment LLE AROM (degrees) Left Knee Extension: 0  Left Knee Flexion: 101  LLE PROM (degrees) Left Knee Extension: 0 Left Knee Flexion: 108 LLE Strength Left Hip Flexion:  (4+/5) Left Hip Extension: 4/5 Left Hip ABduction: 5/5 Left Knee Flexion: 5/5 Left Knee Extension: 5/5 Left Ankle Dorsiflexion: 5/5  Exercise/Treatments Stretches Active Hamstring Stretch: 1 rep;30 seconds Quad Stretch: 1 rep;30 seconds Prone  Hamstring Curl: 10 reps Hip Extension: Left;10 reps   Physical Therapy Assessment and Plan PT Assessment and Plan Clinical Impression Statement: Mr. Nicholas Lewis is referred to PT secondary to L TKR with reports of complications of cellulitis after his surgery.  Today he does not display s/s of cellulitis or DVT.  He is ambulting with SPC in hand and uses it intermittently.  Lt knee AROM: 0-101 Pt will benefit from skilled therapeutic intervention in order to improve on the following deficits: Difficulty walking;Decreased activity tolerance;Decreased strength;Decreased range of motion Rehab Potential: Good PT Frequency: Min 2X/week PT Duration: 4 weeks PT Treatment/Interventions: Gait training;Stair training;Functional mobility training;Therapeutic activities;Therapeutic exercise;Balance training;Neuromuscular re-education;Patient/family education;Manual techniques;Modalities PT Plan: Bike for warm up if needed, general LE strengthening.  Cybex for strength, balance, gait activities  Goals Home Exercise Program Pt will Perform Home Exercise Program: Independently PT Goal: Perform Home Exercise Program - Progress: Goal set today PT Short Term Goals Time to Complete Short Term Goals: 4  weeks PT Short Term Goal 1: Pt will improve AROM 0-110 degrees. PT Short Term Goal 2: Pt will improve his LE strength to [facility] in order to ascend and descend 10 stairs w/1 handrail w/reciprocal pattern in order to enter community dwellings.  PT Short Term Goal 3: Pt will improve LE strength and activity tolerance in order to stand fand walk independently for greater than 30 minutes.  Problem List Patient Active Problem List  Diagnosis  . S/P left TKA  . Knee stiffness    PT Plan of Care PT Home Exercise Plan: see scanned report PT Patient Instructions: Educated on role of PT and exercises to continue at home.  Discussed Cx and NS policy Consulted and Agree with Plan of Care: Patient  GP Functional Assessment Tool Used: LEFS, 5 STS and ambulating intermittently with SPC Functional Limitation: Mobility: Walking and moving around Mobility: Walking and Moving Around Current Status ([id number]): At least 40 percent but less than 60 percent impaired, limited or restricted Mobility: Walking and Moving Around Goal Status [phone]): At least 1 percent but less than 20 percent impaired, limited or restricted  Estera Ozier, PT 09/09/2012, 11:38 AM  Physician Documentation Your signature is required to indicate approval of the treatment plan as stated above.  Please sign and either send electronically or make a copy of this report for your files and return this physician signed original.   Please mark one 1.__approve of plan  2. ___approve of plan with the following conditions.   ______________________________                                                          _____________________ Physician Signature                                                                                                             Date

## 2012-09-11 ENCOUNTER — Ambulatory Visit (HOSPITAL_COMMUNITY): Payer: Medicare Other

## 2012-09-16 ENCOUNTER — Ambulatory Visit (HOSPITAL_COMMUNITY)
Admission: RE | Admit: 2012-09-16 | Discharge: 2012-09-16 | Disposition: A | Discharge status: Home / Self Care | Payer: Medicare Other | Source: Ambulatory Visit | Attending: Orthopedic Surgery | Admitting: Orthopedic Surgery

## 2012-09-16 NOTE — Progress Notes (Signed)
Physical Therapy Treatment Patient Details  Name: Nicholas Lewis MRN: [id number] Date of Birth: [birth year]  Today's Date: 09/16/2012 Time: 0930-1015 PT Time Calculation (min): 45 min  Visit#: 2  of 8   Re-eval: 10/09/12  Charge: therex 45 min  Authorization: MEDICARE  Authorization Time Period: Mobility current: CK, goal: CI  Authorization Visit#: 2  of 8    Subjective: Symptoms/Limitations Symptoms: Only hurts when bending L knee.  Pt compliant with HEP.  Pt ambulating with no AD into PT session. Pain Assessment Currently in Pain?: No/denies  Objective:   Exercise/Treatments Stretches Active Hamstring Stretch: 2 reps;30 seconds Passive Hamstring Stretch: 1 rep;30 seconds;Limitations Passive Hamstring Stretch Limitations: with rope Standing Heel Raises: 15 reps;Limitations Heel Raises Limitations: toe raises Terminal Knee Extension: Left;10 reps;Theraband Theraband Level (Terminal Knee Extension): Level 4 (Blue) Lateral Step Up: Left;10 reps;Hand Hold: 1;Step Height: 4" Forward Step Up: Left;10 reps;Hand Hold: 0;Step Height: 4" Functional Squat: 15 reps Rocker Board: 2 minutes;Limitations Rocker Board Limitations: R/L intermittent HHA SLS: L 23" R 36" max of 3 Other Standing Knee Exercises: tandem stance 2x30"    Physical Therapy Assessment and Plan PT Assessment and Plan Clinical Impression Statement: Began therex for L knee strengthening and balance training per PT POC.  Pt able to complete all exercises following demonstration with min cueing for proper technique with noted increase quad fatigue with step training and cybex quad strengthening machine.  PT Plan: Continue current POC for strengthening, balance and improved gait mechanics.      Goals    Problem List Patient Active Problem List  Diagnosis  . S/P left TKA  . Knee stiffness    PT - End of Session Activity Tolerance: Patient tolerated treatment well General Behavior During Session: [facility] for  tasks performed Cognition: [facility] for tasks performed  GP    Juel Burrow 09/16/2012, 12:55 PM

## 2012-09-17 DIAGNOSIS — Z23 Encounter for immunization: Secondary | ICD-10-CM | POA: Diagnosis not present

## 2012-09-18 ENCOUNTER — Ambulatory Visit (HOSPITAL_COMMUNITY)
Admission: RE | Admit: 2012-09-18 | Discharge: 2012-09-18 | Disposition: A | Discharge status: Home / Self Care | Payer: Medicare Other | Source: Ambulatory Visit

## 2012-09-18 NOTE — Progress Notes (Signed)
Physical Therapy Treatment Patient Details  Name: JR MILLIRON MRN: [id number] Date of Birth: 1941/05/07  Today's Date: 09/18/2012 Time: 0930-1030 PT Time Calculation (min): 60 min  Visit#: 3  of 8   Re-eval: 10/09/12  Charge: therex 50, ice 10'  Authorization: Medicare  Authorization Time Period: Mobility current: CK, goal: CI  Authorization Visit#: 3  of 8    Subjective: Symptoms/Limitations Symptoms: Pt stated he was surprised by how sore he was following last session.  Pt stated pain free following bike, able to make full revolution. Pain Assessment Currently in Pain?: No/denies  Objective:   Exercise/Treatments Stretches Passive Hamstring Stretch: 3 reps;30 seconds;Limitations Passive Hamstring Stretch Limitations: with rope Quad Stretch: 3 reps;30 seconds;Limitations Quad Stretch Limitations: with rope Gastroc Stretch: 3 reps;30 seconds;Limitations Gastroc Stretch Limitations: slant board Aerobic Stationary Bike: 8'@ 1.5 for ROM Standing Heel Raises: 15 reps;Limitations Heel Raises Limitations: toe raises Lateral Step Up: Left;15 reps;Hand Hold: 0;Step Height: 4" Forward Step Up: Left;15 reps;Hand Hold: 1;Step Height: 4" Functional Squat: 15 reps Rocker Board: 2 minutes;Limitations Rocker Board Limitations: R/L intermittent HHA SLS: L 1'+, R 58" SLS with Vectors: 3x 5" with intermittent HHA Other Standing Knee Exercises: tandem stance 1x 30" each, tandem gait and retro tandem gait 1 RT Prone  Hip Extension: Left;15 reps   Modalities Modalities: Cryotherapy Cryotherapy Number Minutes Cryotherapy: 10 Minutes Cryotherapy Location: Knee Type of Cryotherapy: Ice pack  Physical Therapy Assessment and Plan PT Assessment and Plan Clinical Impression Statement: Pt progress well towards total treatment.  Balance is improving, pt able to SLS 1'+ and able to tandem stance 30" with no assistance required.  Progressed to tandem and retro tandem gait with min  assistance required for 2 LOB episodes.  Vc-ing for spatial awareness with no LOB episodes following.  Added vector stance for stability, pt did required intermittent HHA for good form.  Added gastroc and quad stretch into POC for improved flexibility.  Pt was able to make full revolution on bike at beginning of session.  Ice applied to reduce pain and edema, pt reported pain free  at end of session. PT Plan: Continue current POC for strengthening, balance and improved gait mechanics.  Progress to dynamic surfaces wtih balance training (rebounder, balance beam, etc...)    Goals    Problem List Patient Active Problem List  Diagnosis  . S/P left TKA  . Knee stiffness    PT - End of Session Activity Tolerance: Patient tolerated treatment well General Behavior During Session: [facility] for tasks performed Cognition: [facility] for tasks performed  GP    Juel Burrow 09/18/2012, 10:28 AM

## 2012-09-22 ENCOUNTER — Ambulatory Visit (HOSPITAL_COMMUNITY)
Admission: RE | Admit: 2012-09-22 | Discharge: 2012-09-22 | Disposition: A | Discharge status: Home / Self Care | Payer: Medicare Other | Source: Ambulatory Visit | Attending: Orthopedic Surgery | Admitting: Orthopedic Surgery

## 2012-09-22 NOTE — Progress Notes (Signed)
Physical Therapy Treatment Patient Details  Name: Nicholas Lewis MRN: [id number] Date of Birth: 04-23-1941  Today's Date: 09/22/2012 Time: 4098-1191 PT Time Calculation (min): 64 min  Visit#: 4  of 8   Re-eval: 10/09/12 Diagnosis: L TKR Surgical Date: 08/06/12 Next MD Visit: Dr. Gerrianne Scale - October Authorization: Medicare  Authorization Visit#: 4  of 8   Charges: Therex 51', ice 10'  Subjective: Symptoms/Limitations Symptoms: Pt. reports normally painfree unless bends into end range, then increases to 3/10.   Pain Assessment Currently in Pain?: No/denies   Exercise/Treatments Stretches Active Hamstring Stretch: 3 reps;30 seconds;Limitations Active Hamstring Stretch Limitations: with rope Quad Stretch: 3 reps;30 seconds;Limitations Quad Stretch Limitations: with rope Gastroc Stretch: 3 reps;30 seconds;Limitations Gastroc Stretch Limitations: slant board Aerobic Stationary Bike: 8'@ 2.5, seat for ROM Standing Heel Raises: 20 reps;Limitations Heel Raises Limitations: toe raises 20 reps Lateral Step Up: Left;15 reps;Hand Hold: 0;Step Height: 4" Forward Step Up: Left;15 reps;Hand Hold: 1;Step Height: 4" Step Down: Left;15 reps;Step Height: 4";Hand Hold: 1 Functional Squat: 15 reps Rocker Board: 2 minutes;Limitations Rocker Board Limitations: R/L intermittent HHA SLS: D/C SLS with Vectors: 5x 5" with intermittent HHA Other Standing Knee Exercises: tandem gait fwd and retro 2RT each Prone  Hip Extension: 15 reps   Modalities Modalities: Cryotherapy Cryotherapy Number Minutes Cryotherapy: 10 Minutes Cryotherapy Location: Knee Type of Cryotherapy: Ice pack  Physical Therapy Assessment and Plan PT Assessment and Plan Clinical Impression Statement: Pt. progressing well.  Only 1 LOB with tandem gait today but able to recover independently.  L knee flexion re-measured today at 110 degrees AROM.  Pt. 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  Will progress toward this.     Problem List Patient Active Problem List  Diagnosis  . S/P left TKA  . Knee stiffness    PT - End of Session Activity Tolerance: Patient tolerated treatment well General Behavior During Session: [facility] for tasks performed Cognition: [facility] for tasks performed PT Plan of Care PT Home Exercise Plan: Continue to progress toward goals.  Progress to balance beam and add lunges to progress towards kneeling.   Lurena Nida, PTA/CLT 09/22/2012, 9:52 AM

## 2012-09-23 ENCOUNTER — Ambulatory Visit (HOSPITAL_COMMUNITY): Payer: Medicare Other

## 2012-09-25 ENCOUNTER — Ambulatory Visit (HOSPITAL_COMMUNITY)
Admission: RE | Admit: 2012-09-25 | Discharge: 2012-09-25 | Disposition: A | Discharge status: Home / Self Care | Payer: Medicare Other | Source: Ambulatory Visit | Attending: Orthopedic Surgery | Admitting: Orthopedic Surgery

## 2012-09-25 NOTE — Progress Notes (Signed)
Physical Therapy Treatment Patient Details  Name: CONER GIBBARD MRN: [id number] Date of Birth: 1941/09/28  Today's Date: 09/25/2012 Time: 8657-8469 PT Time Calculation (min): 54 min  Visit#: 5  of 8   Re-eval: 10/09/12 (MD apt on 10/02/2012) Assessment Diagnosis: L TKR Surgical Date: 08/06/12 Next MD Visit: Dr. Gerrianne Scale - 10/02/2012 Charge: Therex 44', ice 10'  Authorization: Medicare  Authorization Time Period: Mobility current: CK, goal: CI  Authorization Visit#: 5  of 8    Subjective: Symptoms/Limitations Symptoms: Pt reported he was on feet too much last night at church, acheing pain scale 2-3/10. Pain Assessment Currently in Pain?: Yes Pain Score:   3 Pain Location: Knee Pain Orientation: Left  Objective:   Exercise/Treatments Stretches Active Hamstring Stretch: 3 reps;30 seconds;Limitations Active Hamstring Stretch Limitations: with rope Quad Stretch: 3 reps;30 seconds;Limitations Quad Stretch Limitations: with rope Gastroc Stretch: 3 reps;30 seconds;Limitations Gastroc Stretch Limitations: slant board Aerobic Stationary Bike: 8'@ 2.5, seat 13 for ROM Standing Heel Raises: Limitations Heel Raises Limitations: heel/toe walking 1RT Lateral Step Up: Left;15 reps;Hand Hold: 1;Step Height: 6" Forward Step Up: Left;15 reps;Hand Hold: 1;Step Height: 6" Step Down: Left;10 reps;Hand Hold: 1;Step Height: 6" Functional Squat: 15 reps;Limitations Functional Squat Limitations: proper lifting  Rocker Board: 2 minutes;Limitations Rocker Board Limitations: R/L no HHA SLS with Vectors: 5x 5" with intermittent HHA Other Standing Knee Exercises: tandem and retro tandem gait 1 RT, tandem gait on balance beam 2RT Supine Bridges: Limitations Bridges Limitations: pt instructed bridges to add to HEP Prone  Hip Extension: 15 reps   Modalities Modalities: Cryotherapy Cryotherapy Number Minutes Cryotherapy: 10 Minutes Cryotherapy Location: Knee Type of Cryotherapy: Ice  pack  Physical Therapy Assessment and Plan PT Assessment and Plan Clinical Impression Statement: Pt progressing well towards total POC.  No LOB with tandem gait today, able to progress to balance beam with 3 LOB episodes, pt able to recover independently with no assistance required.  Increased height with step training today, pt did require 1 HHA with step down to assist with weak eccentric control descending PT Plan: Continue with current POC for strenthening, balance and gait mechanics.  Begin forward lunges for knee stability and begin rebounder.next session.    Goals    Problem List Patient Active Problem List  Diagnosis  . S/P left TKA  . Knee stiffness    PT - End of Session Activity Tolerance: Patient tolerated treatment well General Behavior During Session: [facility] for tasks performed Cognition: [facility] for tasks performed  GP    Juel Burrow 09/25/2012, 9:19 AM

## 2012-09-30 ENCOUNTER — Ambulatory Visit (HOSPITAL_COMMUNITY)
Admission: RE | Admit: 2012-09-30 | Discharge: 2012-09-30 | Disposition: A | Discharge status: Home / Self Care | Payer: Medicare Other | Source: Ambulatory Visit | Attending: Orthopedic Surgery | Admitting: Orthopedic Surgery

## 2012-09-30 DIAGNOSIS — M25669 Stiffness of unspecified knee, not elsewhere classified: Secondary | ICD-10-CM | POA: Insufficient documentation

## 2012-09-30 DIAGNOSIS — IMO0001 Reserved for inherently not codable concepts without codable children: Secondary | ICD-10-CM | POA: Diagnosis not present

## 2012-09-30 DIAGNOSIS — M25569 Pain in unspecified knee: Secondary | ICD-10-CM | POA: Insufficient documentation

## 2012-09-30 DIAGNOSIS — M6281 Muscle weakness (generalized): Secondary | ICD-10-CM | POA: Diagnosis not present

## 2012-09-30 NOTE — Progress Notes (Signed)
Physical Therapy Treatment Patient Details  Name: Nicholas Lewis MRN: [id number] Date of Birth: 1941-03-26  Today's Date: 09/30/2012 Time: 8295-6213 PT Time Calculation (min): 42 min Charges: 25' TE, 17' Self Care Visit#: 6  of 8   Re-eval: 10/09/12 (MD apt on 10/02/2012)    Authorization: Medicare  Authorization Time Period: Mobility current: CK, goal: CI  Authorization Visit#: 6  of 8    Subjective: Symptoms/Limitations Symptoms: Pt reports that he is stiff this morning.  He continues to have the greatest amount of difficulty with going from sit to stand.  Pain Assessment Currently in Pain?: Yes Pain Score:   3 ('Stiffness") Pain Location: Knee Pain Orientation: Left  Precautions/Restrictions     Exercise/Treatments Stretches Active Hamstring Stretch: 3 reps;30 seconds;Limitations (BLE) Aerobic Stationary Bike: 10' @ 3.0 seat 13 for ROM Machines for Strengthening Cybex Knee Extension: 4 PL x20 w/ LLE ecc lowering Cybex Knee Flexion: 6.5 PL x20 BLE Standing Stairs: 1 RT w/min use of handrail ascend, mod use of handrail descend w/reciprocal pattern Other Standing Knee Exercises: tandem gait, retro gait, heel walking, toe walking 2 RT each independent  Physical Therapy Assessment and Plan PT Assessment and Plan Clinical Impression Statement: Pt reports significant reduction in stiffness after bike today.  Pt continues to demonstrate balance and ther-ex independently.  Disscuess in detail importance of RLE flexibility before recieving a R TKR in order to maximize benefit.  Pt is satisfied with progress made and continues to improve overall strength and ROM and has greatest limitations in gait secondary to R knee pain.  PT Plan: Re-eval for MD apt.     Goals    Problem List Patient Active Problem List  Diagnosis  . S/P left TKA  . Knee stiffness    PT - End of Session Activity Tolerance: Patient tolerated treatment well General Behavior During Session: [name] for  tasks performed Cognition: [name] for tasks performed PT Plan of Care PT Patient Instructions: Discussed in detail importance of exercising RLE to maximize benefit and improve gait.  Consulted and Agree with Plan of Care: Patient  GP    Nicholas Lewis 09/30/2012, 9:42 AM

## 2012-10-02 ENCOUNTER — Ambulatory Visit (HOSPITAL_COMMUNITY)
Admission: RE | Admit: 2012-10-02 | Discharge: 2012-10-02 | Disposition: A | Discharge status: Home / Self Care | Payer: Medicare Other | Source: Ambulatory Visit | Attending: Orthopedic Surgery | Admitting: Orthopedic Surgery

## 2012-10-02 NOTE — Evaluation (Signed)
Physical Therapy Discharge Summary  Patient Details  Name: Nicholas Lewis MRN: [id number] Date of Birth: Mar 27, 1941  Today's Date: 10/02/2012 Time: 4098-1191 PT Time Calculation (min): 33 min Charges: 1 ROM, 1 MMT, 10' TA, 10' TE Visit#: 7  of 8   Re-eval: 10/09/12 (MD apt on 10/02/2012) Assessment Diagnosis: L TKR Surgical Date: 08/06/12 Next MD Visit: Dr. Gerrianne Scale - 10/02/2012  Authorization: Medicare  Authorization Time Period: Mobility current: CK, goal: CI  Authorization Visit#: 7  of 8    Subjective Symptoms/Limitations Symptoms: Pt reports that he is doing very well and is ready for D/C.  He complains of only minor intermittent pain at night time under his knee cap.  Continues to have mild-moderate stiffness.  How long can you stand comfortably?: 30 minutes continues to have swelling.  (was 10 minutes) How long can you walk comfortably?: independent x45 minutes ( was SPC 1/4 mile) Pain Assessment Currently in Pain?: No/denies ("stiff")  Sensation/Coordination/Flexibility/Functional Tests Functional Tests Functional Tests: LEFS: 62/80 (was 39/80)  Assessment LLE AROM (degrees) Left Knee Extension: 0  Left Knee Flexion: 115  (was 110) LLE PROM (degrees) Left Knee Extension: 0 Left Knee Flexion: 118 (was 108) LLE Strength LLE Overall Strength Comments: 5/5 throughout  Mobility/Balance  Ambulation/Gait Ambulation/Gait: Yes Ambulation/Gait Assistance: 7: Independent Gait Pattern: Within Functional Limits Static Standing Balance Single Leg Stance - Left Leg: 30  Tandem Stance - Left Leg: 30    Exercise/Treatments Aerobic  Bike x8' @ 4.0 seat 12 for activity tolerance Standing Other Standing Knee Exercises: work related activities: 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 10' w/instruction and demonstration.    Physical Therapy Assessment and Plan PT Assessment and Plan Clinical Impression Statement: [name] has attended 7 OP PT  visits s/p L TKR w/ following findings: AROM 0-115, strength 5/5 throughtout, static balance WNL, ambulating independently w/o gait abnormalities, independent w/HEP, only complaints of are intermittent sharp pain to knee cap during night time.  At this time pt will be D/C from PT.   PT Plan: D/C    Goals Pt will Perform Home Exercise Program: Independently: Met PT Short Term Goals: 4 weeks PT Short Term Goal 1: Pt will improve AROM 0-110 degrees.: Met (0-115) PT Short Term Goal 2: Pt will improve his LE strength to [facility] in order to ascend and descend 10 stairs w/1 handrail w/reciprocal pattern in order to enter community dwellings. : Met (without handrail. ) PT Short Term Goal 3: Pt will improve LE strength and activity tolerance in order to stand fand walk independently for greater than 30 minutes.: Met (45 minutes)  Problem List Patient Active Problem List  Diagnosis  . S/P left TKA  . 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  Consulted and Agree with Plan of Care: Patient  GP Functional Limitation: Mobility: Walking and moving around Mobility: Walking and Moving Around Current Status [phone]): At least 1 percent but less than 20 percent impaired, limited or restricted Mobility: Walking and Moving Around Goal Status [phone]): At least 1 percent but less than 20 percent impaired, limited or restricted Mobility: Walking and Moving Around Discharge Status [phone]): At least 1 percent but less than 20 percent impaired, limited or restricted  Kenika Sahm, PT  10/02/2012, 9:29 AM  Physician Documentation Your signature is required to indicate approval of the treatment plan as stated above.  Please sign and either send electronically or  make a copy of this report for your files and return this physician signed original.   Please mark one 1.__approve of plan  2. ___approve of plan with the following conditions.   ______________________________                                                          _____________________ Physician Signature                                                                                                             Date

## 2012-10-07 ENCOUNTER — Ambulatory Visit (HOSPITAL_COMMUNITY): Payer: Medicare Other | Admitting: Physical Therapy

## 2012-10-14 DIAGNOSIS — R079 Chest pain, unspecified: Secondary | ICD-10-CM | POA: Diagnosis not present

## 2012-10-14 DIAGNOSIS — N419 Inflammatory disease of prostate, unspecified: Secondary | ICD-10-CM | POA: Diagnosis not present

## 2012-10-14 DIAGNOSIS — I10 Essential (primary) hypertension: Secondary | ICD-10-CM | POA: Diagnosis not present

## 2012-10-14 DIAGNOSIS — Z125 Encounter for screening for malignant neoplasm of prostate: Secondary | ICD-10-CM | POA: Diagnosis not present

## 2012-10-14 DIAGNOSIS — Z0000 Encounter for general adult medical examination without abnormal findings: Secondary | ICD-10-CM | POA: Diagnosis not present

## 2012-10-14 DIAGNOSIS — Z6833 Body mass index (BMI) 33.0-33.9, adult: Secondary | ICD-10-CM | POA: Diagnosis not present

## 2012-11-06 ENCOUNTER — Other Ambulatory Visit (HOSPITAL_COMMUNITY): Payer: Self-pay | Admitting: Orthopedic Surgery

## 2012-11-06 ENCOUNTER — Ambulatory Visit (HOSPITAL_COMMUNITY)
Admission: RE | Admit: 2012-11-06 | Discharge: 2012-11-06 | Disposition: A | Discharge status: Home / Self Care | Payer: Medicare Other | Source: Ambulatory Visit | Attending: Orthopedic Surgery | Admitting: Orthopedic Surgery

## 2012-11-06 DIAGNOSIS — R208 Other disturbances of skin sensation: Secondary | ICD-10-CM

## 2012-11-06 DIAGNOSIS — M79609 Pain in unspecified limb: Secondary | ICD-10-CM | POA: Diagnosis not present

## 2012-11-06 DIAGNOSIS — R609 Edema, unspecified: Secondary | ICD-10-CM | POA: Insufficient documentation

## 2012-11-06 DIAGNOSIS — R238 Other skin changes: Secondary | ICD-10-CM | POA: Insufficient documentation

## 2012-12-01 DIAGNOSIS — IMO0002 Reserved for concepts with insufficient information to code with codable children: Secondary | ICD-10-CM | POA: Diagnosis not present

## 2012-12-01 DIAGNOSIS — M1710 Unilateral primary osteoarthritis, unspecified knee: Secondary | ICD-10-CM | POA: Diagnosis not present

## 2012-12-02 ENCOUNTER — Ambulatory Visit (INDEPENDENT_AMBULATORY_CARE_PROVIDER_SITE_OTHER): Payer: Medicare Other | Admitting: Urology

## 2012-12-02 DIAGNOSIS — R3129 Other microscopic hematuria: Secondary | ICD-10-CM

## 2012-12-17 ENCOUNTER — Other Ambulatory Visit: Payer: Self-pay | Admitting: Dermatology

## 2012-12-17 DIAGNOSIS — D0439 Carcinoma in situ of skin of other parts of face: Secondary | ICD-10-CM | POA: Diagnosis not present

## 2012-12-17 DIAGNOSIS — C4492 Squamous cell carcinoma of skin, unspecified: Secondary | ICD-10-CM

## 2012-12-17 DIAGNOSIS — D0430 Carcinoma in situ of skin of unspecified part of face: Secondary | ICD-10-CM | POA: Diagnosis not present

## 2012-12-17 DIAGNOSIS — L570 Actinic keratosis: Secondary | ICD-10-CM | POA: Diagnosis not present

## 2012-12-17 HISTORY — DX: Squamous cell carcinoma of skin, unspecified: C44.92

## 2013-01-01 DIAGNOSIS — D0430 Carcinoma in situ of skin of unspecified part of face: Secondary | ICD-10-CM | POA: Diagnosis not present

## 2013-01-01 DIAGNOSIS — D0439 Carcinoma in situ of skin of other parts of face: Secondary | ICD-10-CM | POA: Diagnosis not present

## 2013-03-21 DIAGNOSIS — J029 Acute pharyngitis, unspecified: Secondary | ICD-10-CM | POA: Diagnosis not present

## 2013-03-21 DIAGNOSIS — Z6835 Body mass index (BMI) 35.0-35.9, adult: Secondary | ICD-10-CM | POA: Diagnosis not present

## 2013-05-18 DIAGNOSIS — H10509 Unspecified blepharoconjunctivitis, unspecified eye: Secondary | ICD-10-CM | POA: Diagnosis not present

## 2013-07-22 DIAGNOSIS — D239 Other benign neoplasm of skin, unspecified: Secondary | ICD-10-CM | POA: Diagnosis not present

## 2013-07-22 DIAGNOSIS — L570 Actinic keratosis: Secondary | ICD-10-CM | POA: Diagnosis not present

## 2013-08-13 DIAGNOSIS — M5430 Sciatica, unspecified side: Secondary | ICD-10-CM | POA: Diagnosis not present

## 2013-08-13 DIAGNOSIS — Z6835 Body mass index (BMI) 35.0-35.9, adult: Secondary | ICD-10-CM | POA: Diagnosis not present

## 2013-09-24 DIAGNOSIS — Z23 Encounter for immunization: Secondary | ICD-10-CM | POA: Diagnosis not present

## 2013-09-28 DIAGNOSIS — J019 Acute sinusitis, unspecified: Secondary | ICD-10-CM | POA: Diagnosis not present

## 2013-09-28 DIAGNOSIS — J209 Acute bronchitis, unspecified: Secondary | ICD-10-CM | POA: Diagnosis not present

## 2013-09-28 DIAGNOSIS — Z6835 Body mass index (BMI) 35.0-35.9, adult: Secondary | ICD-10-CM | POA: Diagnosis not present

## 2013-10-29 ENCOUNTER — Ambulatory Visit (HOSPITAL_COMMUNITY)
Admission: RE | Admit: 2013-10-29 | Discharge: 2013-10-29 | Disposition: A | Discharge status: Home / Self Care | Payer: Medicare Other | Source: Ambulatory Visit | Attending: Family Medicine | Admitting: Family Medicine

## 2013-10-29 ENCOUNTER — Other Ambulatory Visit (HOSPITAL_COMMUNITY): Payer: Self-pay | Admitting: Family Medicine

## 2013-10-29 DIAGNOSIS — I10 Essential (primary) hypertension: Secondary | ICD-10-CM

## 2013-10-29 DIAGNOSIS — Z0000 Encounter for general adult medical examination without abnormal findings: Secondary | ICD-10-CM

## 2013-10-29 DIAGNOSIS — R059 Cough, unspecified: Secondary | ICD-10-CM | POA: Diagnosis not present

## 2013-10-29 DIAGNOSIS — K219 Gastro-esophageal reflux disease without esophagitis: Secondary | ICD-10-CM | POA: Diagnosis not present

## 2013-10-29 DIAGNOSIS — Z125 Encounter for screening for malignant neoplasm of prostate: Secondary | ICD-10-CM | POA: Diagnosis not present

## 2013-10-29 DIAGNOSIS — R7301 Impaired fasting glucose: Secondary | ICD-10-CM | POA: Diagnosis not present

## 2013-10-29 DIAGNOSIS — Z6835 Body mass index (BMI) 35.0-35.9, adult: Secondary | ICD-10-CM | POA: Diagnosis not present

## 2013-10-29 DIAGNOSIS — R05 Cough: Secondary | ICD-10-CM | POA: Insufficient documentation

## 2013-12-01 DIAGNOSIS — M2040 Other hammer toe(s) (acquired), unspecified foot: Secondary | ICD-10-CM | POA: Diagnosis not present

## 2013-12-01 DIAGNOSIS — M779 Enthesopathy, unspecified: Secondary | ICD-10-CM | POA: Diagnosis not present

## 2013-12-01 DIAGNOSIS — M216X9 Other acquired deformities of unspecified foot: Secondary | ICD-10-CM | POA: Diagnosis not present

## 2013-12-15 DIAGNOSIS — M779 Enthesopathy, unspecified: Secondary | ICD-10-CM | POA: Diagnosis not present

## 2013-12-15 DIAGNOSIS — M216X9 Other acquired deformities of unspecified foot: Secondary | ICD-10-CM | POA: Diagnosis not present

## 2013-12-15 DIAGNOSIS — M2040 Other hammer toe(s) (acquired), unspecified foot: Secondary | ICD-10-CM | POA: Diagnosis not present

## 2014-01-22 DIAGNOSIS — J069 Acute upper respiratory infection, unspecified: Secondary | ICD-10-CM | POA: Diagnosis not present

## 2014-01-25 DIAGNOSIS — J40 Bronchitis, not specified as acute or chronic: Secondary | ICD-10-CM | POA: Diagnosis not present

## 2014-01-25 DIAGNOSIS — R059 Cough, unspecified: Secondary | ICD-10-CM | POA: Diagnosis not present

## 2014-01-25 DIAGNOSIS — J111 Influenza due to unidentified influenza virus with other respiratory manifestations: Secondary | ICD-10-CM | POA: Diagnosis not present

## 2014-01-25 DIAGNOSIS — J189 Pneumonia, unspecified organism: Secondary | ICD-10-CM | POA: Diagnosis not present

## 2014-01-25 DIAGNOSIS — R05 Cough: Secondary | ICD-10-CM | POA: Diagnosis not present

## 2014-01-25 DIAGNOSIS — J9819 Other pulmonary collapse: Secondary | ICD-10-CM | POA: Diagnosis not present

## 2014-01-27 DIAGNOSIS — Z6836 Body mass index (BMI) 36.0-36.9, adult: Secondary | ICD-10-CM | POA: Diagnosis not present

## 2014-01-27 DIAGNOSIS — J984 Other disorders of lung: Secondary | ICD-10-CM | POA: Diagnosis not present

## 2014-01-27 DIAGNOSIS — J111 Influenza due to unidentified influenza virus with other respiratory manifestations: Secondary | ICD-10-CM | POA: Diagnosis not present

## 2014-02-04 DIAGNOSIS — M216X9 Other acquired deformities of unspecified foot: Secondary | ICD-10-CM | POA: Diagnosis not present

## 2014-02-04 DIAGNOSIS — M2040 Other hammer toe(s) (acquired), unspecified foot: Secondary | ICD-10-CM | POA: Diagnosis not present

## 2014-02-04 DIAGNOSIS — M779 Enthesopathy, unspecified: Secondary | ICD-10-CM | POA: Diagnosis not present

## 2014-02-13 DIAGNOSIS — J029 Acute pharyngitis, unspecified: Secondary | ICD-10-CM | POA: Diagnosis not present

## 2014-02-19 DIAGNOSIS — Z6836 Body mass index (BMI) 36.0-36.9, adult: Secondary | ICD-10-CM | POA: Diagnosis not present

## 2014-02-19 DIAGNOSIS — J329 Chronic sinusitis, unspecified: Secondary | ICD-10-CM | POA: Diagnosis not present

## 2014-02-19 DIAGNOSIS — J189 Pneumonia, unspecified organism: Secondary | ICD-10-CM | POA: Diagnosis not present

## 2014-02-24 DIAGNOSIS — M1710 Unilateral primary osteoarthritis, unspecified knee: Secondary | ICD-10-CM | POA: Diagnosis not present

## 2014-02-24 DIAGNOSIS — Z96659 Presence of unspecified artificial knee joint: Secondary | ICD-10-CM | POA: Diagnosis not present

## 2014-04-06 DIAGNOSIS — M1710 Unilateral primary osteoarthritis, unspecified knee: Secondary | ICD-10-CM | POA: Diagnosis not present

## 2014-04-14 DIAGNOSIS — M1710 Unilateral primary osteoarthritis, unspecified knee: Secondary | ICD-10-CM | POA: Diagnosis not present

## 2014-04-21 DIAGNOSIS — M1710 Unilateral primary osteoarthritis, unspecified knee: Secondary | ICD-10-CM | POA: Diagnosis not present

## 2014-05-31 ENCOUNTER — Other Ambulatory Visit: Payer: Self-pay | Admitting: Dermatology

## 2014-05-31 DIAGNOSIS — L570 Actinic keratosis: Secondary | ICD-10-CM | POA: Diagnosis not present

## 2014-05-31 DIAGNOSIS — D045 Carcinoma in situ of skin of trunk: Secondary | ICD-10-CM | POA: Diagnosis not present

## 2014-05-31 DIAGNOSIS — D485 Neoplasm of uncertain behavior of skin: Secondary | ICD-10-CM | POA: Diagnosis not present

## 2014-05-31 DIAGNOSIS — D0460 Carcinoma in situ of skin of unspecified upper limb, including shoulder: Secondary | ICD-10-CM | POA: Diagnosis not present

## 2014-05-31 DIAGNOSIS — D0430 Carcinoma in situ of skin of unspecified part of face: Secondary | ICD-10-CM | POA: Diagnosis not present

## 2014-05-31 DIAGNOSIS — B079 Viral wart, unspecified: Secondary | ICD-10-CM | POA: Diagnosis not present

## 2014-05-31 DIAGNOSIS — D0439 Carcinoma in situ of skin of other parts of face: Secondary | ICD-10-CM | POA: Diagnosis not present

## 2014-06-01 DIAGNOSIS — D6949 Other primary thrombocytopenia: Secondary | ICD-10-CM | POA: Diagnosis not present

## 2014-06-01 DIAGNOSIS — R7301 Impaired fasting glucose: Secondary | ICD-10-CM | POA: Diagnosis not present

## 2014-06-01 DIAGNOSIS — M109 Gout, unspecified: Secondary | ICD-10-CM | POA: Diagnosis not present

## 2014-07-08 ENCOUNTER — Other Ambulatory Visit: Payer: Self-pay | Admitting: Dermatology

## 2014-07-08 DIAGNOSIS — B079 Viral wart, unspecified: Secondary | ICD-10-CM | POA: Diagnosis not present

## 2014-07-08 DIAGNOSIS — D0430 Carcinoma in situ of skin of unspecified part of face: Secondary | ICD-10-CM | POA: Diagnosis not present

## 2014-07-08 DIAGNOSIS — D0439 Carcinoma in situ of skin of other parts of face: Secondary | ICD-10-CM | POA: Diagnosis not present

## 2014-07-08 DIAGNOSIS — D485 Neoplasm of uncertain behavior of skin: Secondary | ICD-10-CM | POA: Diagnosis not present

## 2014-07-08 DIAGNOSIS — D045 Carcinoma in situ of skin of trunk: Secondary | ICD-10-CM | POA: Diagnosis not present

## 2014-07-11 DIAGNOSIS — M66329 Spontaneous rupture of flexor tendons, unspecified upper arm: Secondary | ICD-10-CM | POA: Diagnosis not present

## 2014-07-12 DIAGNOSIS — S42213A Unspecified displaced fracture of surgical neck of unspecified humerus, initial encounter for closed fracture: Secondary | ICD-10-CM | POA: Diagnosis not present

## 2014-07-13 DIAGNOSIS — M1710 Unilateral primary osteoarthritis, unspecified knee: Secondary | ICD-10-CM | POA: Diagnosis not present

## 2014-07-23 DIAGNOSIS — Z5189 Encounter for other specified aftercare: Secondary | ICD-10-CM | POA: Diagnosis not present

## 2014-08-09 DIAGNOSIS — M1710 Unilateral primary osteoarthritis, unspecified knee: Secondary | ICD-10-CM | POA: Diagnosis not present

## 2014-09-09 ENCOUNTER — Other Ambulatory Visit: Payer: Self-pay | Admitting: Orthopedic Surgery

## 2014-09-09 NOTE — Progress Notes (Signed)
Preoperative surgical orders have been place into the Epic hospital system for Nicholas Lewis on 09/09/2014, 1:14 PM  by Mickel Crow for surgery on 09/20/2014.  Preop Total Knee orders including Experal, IV Tylenol, and IV Decadron as long as there are no contraindications to the above medications. Arlee Muslim, PA-C

## 2014-09-09 NOTE — Progress Notes (Signed)
Please put orders in Epic surgery 09-20-14 pre op 09-15-14 Thanks

## 2014-09-13 ENCOUNTER — Encounter (HOSPITAL_COMMUNITY): Payer: Self-pay | Admitting: Pharmacy Technician

## 2014-09-15 ENCOUNTER — Ambulatory Visit (HOSPITAL_COMMUNITY)
Admission: RE | Admit: 2014-09-15 | Discharge: 2014-09-15 | Disposition: A | Discharge status: Home / Self Care | Payer: Medicare Other | Source: Ambulatory Visit | Attending: Anesthesiology | Admitting: Anesthesiology

## 2014-09-15 ENCOUNTER — Encounter (INDEPENDENT_AMBULATORY_CARE_PROVIDER_SITE_OTHER): Payer: Self-pay

## 2014-09-15 ENCOUNTER — Encounter (HOSPITAL_COMMUNITY): Payer: Self-pay

## 2014-09-15 ENCOUNTER — Encounter (HOSPITAL_COMMUNITY)
Admission: RE | Admit: 2014-09-15 | Discharge: 2014-09-15 | Disposition: A | Discharge status: Home / Self Care | Payer: Medicare Other | Source: Ambulatory Visit | Attending: Orthopedic Surgery | Admitting: Orthopedic Surgery

## 2014-09-15 DIAGNOSIS — M25669 Stiffness of unspecified knee, not elsewhere classified: Secondary | ICD-10-CM | POA: Insufficient documentation

## 2014-09-15 DIAGNOSIS — IMO0002 Reserved for concepts with insufficient information to code with codable children: Secondary | ICD-10-CM | POA: Diagnosis not present

## 2014-09-15 DIAGNOSIS — Z01818 Encounter for other preprocedural examination: Secondary | ICD-10-CM | POA: Insufficient documentation

## 2014-09-15 DIAGNOSIS — M1710 Unilateral primary osteoarthritis, unspecified knee: Secondary | ICD-10-CM | POA: Diagnosis present

## 2014-09-15 HISTORY — DX: Cellulitis, unspecified: L03.90

## 2014-09-15 LAB — CBC
HCT: 41.2 % (ref 39.0–52.0)
Hemoglobin: 14.3 g/dL (ref 13.0–17.0)
MCH: 31.4 pg (ref 26.0–34.0)
MCHC: 34.7 g/dL (ref 30.0–36.0)
MCV: 90.4 fL (ref 78.0–100.0)
Platelets: 148 10*3/uL — ABNORMAL LOW (ref 150–400)
RBC: 4.56 MIL/uL (ref 4.22–5.81)
RDW: 13.5 % (ref 11.5–15.5)
WBC: 8.1 10*3/uL (ref 4.0–10.5)

## 2014-09-15 LAB — URINALYSIS, ROUTINE W REFLEX MICROSCOPIC
Bilirubin Urine: NEGATIVE
Glucose, UA: NEGATIVE mg/dL
Hgb urine dipstick: NEGATIVE
Ketones, ur: NEGATIVE mg/dL
Leukocytes, UA: NEGATIVE
Nitrite: NEGATIVE
Protein, ur: NEGATIVE mg/dL
Specific Gravity, Urine: 1.021 (ref 1.005–1.030)
Urobilinogen, UA: 1 mg/dL (ref 0.0–1.0)
pH: 7.5 (ref 5.0–8.0)

## 2014-09-15 LAB — COMPREHENSIVE METABOLIC PANEL
ALT: 16 U/L (ref 0–53)
AST: 26 U/L (ref 0–37)
Albumin: 3.7 g/dL (ref 3.5–5.2)
Alkaline Phosphatase: 89 U/L (ref 39–117)
Anion gap: 9 (ref 5–15)
BUN: 19 mg/dL (ref 6–23)
CO2: 30 mEq/L (ref 19–32)
Calcium: 9.6 mg/dL (ref 8.4–10.5)
Chloride: 102 mEq/L (ref 96–112)
Creatinine, Ser: 0.91 mg/dL (ref 0.50–1.35)
GFR calc Af Amer: >90 mL/min (ref >90)
GFR calc non Af Amer: 83 mL/min — ABNORMAL LOW (ref >90)
Glucose, Bld: 84 mg/dL (ref 70–99)
Potassium: 3.8 mEq/L (ref 3.7–5.3)
Sodium: 141 mEq/L (ref 137–147)
Total Bilirubin: 1.1 mg/dL (ref 0.3–1.2)
Total Protein: 6.9 g/dL (ref 6.0–8.3)

## 2014-09-15 LAB — PROTIME-INR
INR: 1.1 (ref 0.00–1.49)
Prothrombin Time: 14.2 seconds (ref 11.6–15.2)

## 2014-09-15 LAB — APTT: aPTT: 33 seconds (ref 24–37)

## 2014-09-15 LAB — SURGICAL PCR SCREEN
MRSA, PCR: NEGATIVE
Staphylococcus aureus: NEGATIVE

## 2014-09-15 NOTE — Progress Notes (Signed)
09/15/14 [place]  Have you ever been diagnosed with sleep apnea through a sleep study? No  Do you snore loudly (loud enough to be heard through closed doors)?  0  Do you often feel tired, fatigued, or sleepy during the daytime? 0  Has anyone observed you stop breathing during your sleep? 0  Do you have, or are you being treated for high blood pressure? 1  BMI more than 35 kg/m2? 0  Age over 72 years old? 1  Neck circumference greater than 40 cm/16 inches? 1  Gender: 1  Obstructive Sleep Apnea Score 4  Score 4 or greater  Results sent to PCP

## 2014-09-15 NOTE — Patient Instructions (Signed)
20     Your procedure is scheduled on:  Monday 09/20/2014  Report to [facility] Main Entrance and follow signs to Short Stay  At  [place]  AM.  Call this number if you have problems the night before or morning of surgery:  [phone]   Remember:          Do not eat food or drink liquids AFTER MIDNIGHT!  Take these medicines the morning of surgery with A SIP OF WATER: Metoprolol     IS NOT RESPONSIBLE FOR ANY BELONGINGS OR VALUABLES BROUGHT TO HOSPITAL.  Marland Kitchen  Leave suitcase in the car. After surgery it may be brought to your room.  For patients admitted to the hospital, checkout time is 11:00 AM the day of              Discharge.    DO NOT WEAR  JEWELRY,MAKE-UP,LOTIONS,POWDERS,PERFUMES,CONTACTS , DENTURES OR BRIDGEWORK ,AND DO NOT WEAR FALSE EYELASHES                                    Patients discharged the day of surgery will not be allowed to drive home.   If going home the same day of surgery, must have someone stay with you   first 24 hrs.at home and arrange for someone to drive you home from the [place]: N/A   Special Instructions:              Please read over the following fact sheets that you were given:             1. [place] - Preparing for Surgery Before surgery, you can play an important role.  Because skin is not sterile, your skin needs to be as free of germs as possible.  You can reduce the number of germs on your skin by washing with CHG (chlorahexidine gluconate) soap before surgery.  CHG is an antiseptic cleaner which kills germs and bonds with the skin to continue killing germs even after washing. Please DO NOT use if you have an allergy to CHG or antibacterial soaps.  If your skin becomes reddened/irritated stop using the CHG and inform your nurse when you arrive at Short  Stay. Do not shave (including legs and underarms) for at least 48 hours prior to the first CHG shower.  You may shave your face/neck. Please follow these instructions carefully:  1.  Shower with CHG Soap the night before surgery and the  morning of Surgery.  2.  If you choose to wash your hair, wash your hair first as usual with your  normal  shampoo.  3.  After you shampoo, rinse your hair and body thoroughly to remove the  shampoo.  4.  Use CHG as you would any other liquid soap.  You can apply chg directly  to the skin and wash                       Gently with a scrungie or clean washcloth.  5.  Apply the CHG Soap to your body ONLY FROM THE NECK DOWN.   Do not use on face/ open                           Wound or open sores. Avoid contact with eyes, ears mouth and genitals (private parts).                       Wash face,  Genitals (private parts) with your normal soap.             6.  Wash thoroughly, paying special attention to the area where your surgery  will be performed.  7.  Thoroughly rinse your body with warm water from the neck down.  8.  DO NOT shower/wash with your normal soap after using and rinsing off  the CHG Soap.                9.  Pat yourself dry with a clean towel.            10.  Wear clean pajamas.            11.  Place clean sheets on your bed the night of your first shower and do not  sleep with pets. Day of Surgery : Do not apply any lotions/deodorants the morning of surgery.  Please wear clean clothes to the hospital/surgery center.  FAILURE TO FOLLOW THESE INSTRUCTIONS MAY RESULT IN THE CANCELLATION OF YOUR SURGERY PATIENT SIGNATURE_________________________________  NURSE SIGNATURE__________________________________  ________________________________________________________________________   Nicholas Lewis  An incentive spirometer is a tool that can help keep your lungs clear and active. This tool measures how well you are  filling your lungs with each breath. Taking long deep breaths may help reverse or decrease the chance of developing breathing (pulmonary) problems (especially infection) following:  A long period of time when you are unable to move or be active. BEFORE THE PROCEDURE   If the spirometer includes an indicator to show your best effort, your nurse or respiratory therapist will set it to a desired goal.  If possible, sit up straight or lean slightly forward. Try not to slouch.  Hold the incentive spirometer in an upright position. INSTRUCTIONS FOR USE  1. Sit on the edge of your bed if possible, or sit up as far as you can in bed or on a chair. 2. Hold the incentive spirometer in an upright position. 3. Breathe out normally. 4. Place the mouthpiece in your mouth and seal your lips tightly around it. 5. Breathe in slowly and as deeply as possible, raising the piston or the ball toward the top of the column. 6. Hold your breath for 3-5 seconds or for as long as possible. Allow the piston or ball to fall to the bottom of the column. 7. Remove the mouthpiece from your mouth and breathe out normally. 8. Rest for a few seconds and repeat Steps 1 through 7 at least 10 times every 1-2 hours when you are awake. Take your time and take a few normal breaths between deep breaths. 9. The spirometer may include an indicator to show  your best effort. Use the indicator as a goal to work toward during each repetition. 10. After each set of 10 deep breaths, practice coughing to be sure your lungs are clear. If you have an incision (the cut made at the time of surgery), support your incision when coughing by placing a pillow or rolled up towels firmly against it. Once you are able to get out of bed, walk around indoors and cough well. You may stop using the incentive spirometer when instructed by your caregiver.  RISKS AND COMPLICATIONS  Take your time so you do not get dizzy or light-headed.  If you are in pain,  you may need to take or ask for pain medication before doing incentive spirometry. It is harder to take a deep breath if you are having pain. AFTER USE  Rest and breathe slowly and easily.  It can be helpful to keep track of a log of your progress. Your caregiver can provide you with a simple table to help with this. If you are using the spirometer at home, follow these instructions: [place] IF:   You are having difficultly using the spirometer.  You have trouble using the spirometer as often as instructed.  Your pain medication is not giving enough relief while using the spirometer.  You develop fever of 100.5 F (38.1 C) or higher. SEEK IMMEDIATE MEDICAL CARE IF:   You cough up bloody sputum that had not been present before.  You develop fever of 102 F (38.9 C) or greater.  You develop worsening pain at or near the incision site. MAKE SURE YOU:   Understand these instructions.  Will watch your condition.  Will get help right away if you are not doing well or get worse. Document Released: 04/29/2007 Document Revised: 03/10/2012 Document Reviewed: 06/30/2007 ExitCare Patient Information 2014 ExitCare, [place].   ________________________________________________________________________  WHAT IS A BLOOD TRANSFUSION? Blood Transfusion Information  A transfusion is the replacement of blood or some of its parts. Blood is made up of multiple cells which provide different functions.  Red blood cells carry oxygen and are used for blood loss replacement.  White blood cells fight against infection.  Platelets control bleeding.  Plasma helps clot blood.  Other blood products are available for specialized needs, such as hemophilia or other clotting disorders. BEFORE THE TRANSFUSION  Who gives blood for transfusions?   Healthy volunteers who are fully evaluated to make sure their blood is safe. This is blood bank blood. Transfusion therapy is the safest it has ever  been in the practice of medicine. Before blood is taken from a donor, a complete history is taken to make sure that person has no history of diseases nor engages in risky social behavior (examples are intravenous drug use or sexual activity with multiple partners). The donor's travel history is screened to minimize risk of transmitting infections, such as malaria. The donated blood is tested for signs of infectious diseases, such as HIV and hepatitis. The blood is then tested to be sure it is compatible with you in order to minimize the chance of a transfusion reaction. If you or a relative donates blood, this is often done in anticipation of surgery and is not appropriate for emergency situations. It takes many days to process the donated blood. RISKS AND COMPLICATIONS Although transfusion therapy is very safe and saves many lives, the main dangers of transfusion include:   Getting an infectious disease.  Developing a transfusion reaction. This is an allergic reaction to  something in the blood you were given. Every precaution is taken to prevent this. The decision to have a blood transfusion has been considered carefully by your caregiver before blood is given. Blood is not given unless the benefits outweigh the risks. AFTER THE TRANSFUSION  Right after receiving a blood transfusion, you will usually feel much better and more energetic. This is especially true if your red blood cells have gotten low (anemic). The transfusion raises the level of the red blood cells which carry oxygen, and this usually causes an energy increase.  The nurse administering the transfusion will monitor you carefully for complications. HOME CARE INSTRUCTIONS  No special instructions are needed after a transfusion. You may find your energy is better. Speak with your caregiver about any limitations on activity for underlying diseases you may have. SEEK MEDICAL CARE IF:   Your condition is not improving after your  transfusion.  You develop redness or irritation at the intravenous (IV) site. SEEK IMMEDIATE MEDICAL CARE IF:  Any of the following symptoms occur over the next 12 hours:  Shaking chills.  You have a temperature by mouth above 102 F (38.9 C), not controlled by medicine.  Chest, back, or muscle pain.  People around you feel you are not acting correctly or are confused.  Shortness of breath or difficulty breathing.  Dizziness and fainting.  You get a rash or develop hives.  You have a decrease in urine output.  Your urine turns a dark color or changes to pink, red, or brown. Any of the following symptoms occur over the next 10 days:  You have a temperature by mouth above 102 F (38.9 C), not controlled by medicine.  Shortness of breath.  Weakness after normal activity.  The white part of the eye turns yellow (jaundice).  You have a decrease in the amount of urine or are urinating less often.  Your urine turns a dark color or changes to pink, red, or brown. Document Released: 12/14/2000 Document Revised: 03/10/2012 Document Reviewed: 08/02/2008 [facility] Patient Information 2014 [place], [place].  _______________________________________________________________________

## 2014-09-15 NOTE — Progress Notes (Signed)
06/01/2014-pre-operative clearance from Dr. Hilma Favors on chart. 06/01/2014-last office note from Dr. Sharilyn Sites on chart.

## 2014-09-19 ENCOUNTER — Other Ambulatory Visit: Payer: Self-pay | Admitting: Orthopedic Surgery

## 2014-09-19 NOTE — H&P (Signed)
Nicholas Lewis. Kilts DOB: 1941-02-07 Married / Language: English / Race: White Male Date of Admission:  09/20/2014 Chief Complaint:  Right Knee Pain History of Present Illness  The patient is a 73 year old male who comes in for a preoperative History and Physical. The patient is scheduled for a right total knee arthroplasty to be performed by Dr. Dione Plover. Aluisio, MD at [facility] on 09-20-2014. The patient is a 73 year old male who presents with knee complaints. The patient is seen after transfer of care from Dr. Shellia Carwin. The patient reports right knee symptoms including: pain, instability, catching, soreness and grinding which began year(s) ago.The patient feels that the symptoms are worsening. The patient has the current diagnosis of knee osteoarthritis. Previous work-up for this problem has included knee x-rays. Past treatment for this problem has included intra-articular injection of corticosteroids (he also finished a repeat series of Euflexxa a little over two years ago). Current treatment includes elastic bandage (occasionally) and nonsteroidal anti-inflammatory drugs (Advil; usually around 6 tabs per day). Risk factors include total knee replacement (He had a left total knee by Dr. Shellia Carwin in 2013. The left knee is doing very well.). Note for "Knee pain": He discussed having the right knee replaced at his last office visit with Dr. Shellia Carwin. He is ready to start moving forward with that, at this time. He had a previous total knee arthroplasty on the left knee by Dr. Shellia Carwin, which is doing quite well. He has had previous cortisone shots and Visco supplementations into his right knee. The last time being two years ago for the Visco. Here recently he has had more discomfort in his right knee. The left knee, again, is not giving him any discomfort or problems. The right knee crunches, grinds, and occasionally feels like it may give way. He is ready to proceed with surgery. Risks and  benefits of the surgery have been discussed with the patient and they elect to proceed with surgery.  There are on active contraindications to upcoming procedure such as ongoing infection or progressive neurological disease.   Problem List/Past Medical  Status post total left knee replacement (V43.65  [id number]) Primary osteoarthritis of right knee (715.16  M17.11)  Allergies Sulfa Drugs visual changes Mobic *ANALGESICS - ANTI-INFLAMMATORY* Swelling. Face flush  Family History  Hypertension Father. Severe allergy Sister. Other medical problems Mother had Schlerderma (not sure of spelling) Osteoarthritis mother First Degree Relatives reported Cancer Father, Sister. father  Social History Exercise Exercises rarely; does individual sport Exercises daily; does running / walking Current work status retired Children 1 2 Pain Contract no Tobacco use Never smoker. 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 Saw Palmetto (Oral) Specific dose unknown - Active. Hydrochlorothiazide (25MG  Tablet, Oral) Active. Metoprolol Succinate (100MG  Tablet ER, Oral) Active. Aspirin EC (81MG  Tablet DR, Oral) Active. One-A-Day Mens (1 Oral) Active. Allopurinol (300MG  Tablet, Oral) Active.  Past Surgical History  Colon Polyp Removal - Colonoscopy Total Knee Replacement - Left08/06/2012 by Dr. Shellia Carwin  Past Medical History Kidney  Stone High blood pressure Gout  Osteoarthritis Autoimmune disorder child Cataract Stable, checked every year Hemorrhoids Degenerative Disc Disease Spondylolisthesis, Acquired (738.4) Radiculitis, Thoracic or Lumbar (724.4)  Review of Systems General Not Present- Chills, Fatigue, Fever, Memory Loss, Night Sweats, Weight Gain and Weight Loss. Skin Not Present- Eczema, Hives, Itching, Lesions and Rash. HEENT Not Present- Dentures, Double Vision, Headache, Hearing Loss, Tinnitus and Visual Loss. Respiratory Not Present- Allergies, Chronic Cough, Coughing up blood, Shortness of breath at rest and Shortness of breath with exertion. Cardiovascular Not Present- Chest Pain, Difficulty Breathing Lying Down, Murmur, Palpitations, Racing/skipping heartbeats and Swelling. Gastrointestinal Not Present- Abdominal Pain, Bloody Stool, Constipation, Diarrhea, Difficulty Swallowing, Heartburn, Jaundice, Loss of appetitie, Nausea and Vomiting. Male Genitourinary Not Present- Blood in Urine, Discharge, Flank Pain, Incontinence, Painful Urination, Urgency, Urinary frequency, Urinary Retention, Urinating at Night and Weak urinary stream. Musculoskeletal Present- Joint Pain and Joint Swelling. Not Present- Back Pain, Morning Stiffness, Muscle Pain, Muscle Weakness and Spasms. Neurological Not Present- Blackout spells, Difficulty with balance, Dizziness, Paralysis, Tremor and Weakness. Psychiatric Not Present- Insomnia.   Vitals Weight: 225 lb Height: 72in Body Surface Area: 2.28 m Body Mass Index: 30.52 kg/m Pulse: 54 (Regular)  Resp.: 12 (Unlabored)  BP: 124/74 (Sitting, Right Arm, Standard) Bradycardia noted. On metoprolol.   Physical Exam The physical exam findings are as follows: Note:Patient is a 73 year old male with continued knee pain. Patient is accompanied today by his wife Nicholas Lewis.  General Mental Status -Alert, cooperative and good historian. General  Appearance-pleasant, Not in acute distress. Orientation-Oriented X3. Build & Nutrition-Well nourished and Well developed.  Head and Neck Head-normocephalic, atraumatic . Neck Global Assessment - supple, no bruit auscultated on the right, no bruit auscultated on the left.  Eye Vision-Wears corrective lenses. Pupil - Bilateral-Regular and Round. Motion - Bilateral-EOMI.  Chest and Lung Exam Auscultation Breath sounds - clear at anterior chest wall and clear at posterior chest wall. Adventitious sounds - No Adventitious sounds.  Cardiovascular Auscultation Rhythm - Bradycardic(otherwise regular rhythm.). Heart Sounds - S1 WNL and S2 WNL. Murmurs & Other Heart Sounds - Auscultation of the heart reveals - No Murmurs.  Abdomen Palpation/Percussion Tenderness - Abdomen is non-tender to palpation. Rigidity (guarding) - Abdomen is soft. Auscultation Auscultation of the abdomen reveals - Bowel sounds normal.  Male Genitourinary Note: Not done, not pertinent to present illness   Musculoskeletal Note: Full extension and further flexion to 115 degrees with tightness. Negative McMurray's. Moderate patellofemoral crepitation. Sensation and circulation are intact.  Assessment & Plan  Primary osteoarthritis of right knee (715.16  M17.11) Note:Plan is for a Right Total Knee Replacement by Dr. Wynelle Link.  Plan is to go home.  PCP - Dr. Sharilyn Sites  The patient does not have any contraindications and will receive TXA (tranexamic acid) prior to surgery.  Signed electronically by Joelene Millin, III PA-C

## 2014-09-20 ENCOUNTER — Encounter (HOSPITAL_COMMUNITY)
Admission: RE | Disposition: A | Discharge status: Home Health | Payer: Self-pay | Source: Ambulatory Visit | Attending: Orthopedic Surgery

## 2014-09-20 ENCOUNTER — Inpatient Hospital Stay (HOSPITAL_COMMUNITY): Payer: Medicare Other | Admitting: Anesthesiology

## 2014-09-20 ENCOUNTER — Encounter (HOSPITAL_COMMUNITY): Payer: Medicare Other | Admitting: Anesthesiology

## 2014-09-20 ENCOUNTER — Encounter (HOSPITAL_COMMUNITY): Payer: Self-pay | Admitting: *Deleted

## 2014-09-20 ENCOUNTER — Inpatient Hospital Stay (HOSPITAL_COMMUNITY)
Admission: RE | Admit: 2014-09-20 | Discharge: 2014-09-22 | DRG: 470 | Disposition: A | Discharge status: Home Health | Payer: Medicare Other | Source: Ambulatory Visit | Attending: Orthopedic Surgery | Admitting: Orthopedic Surgery

## 2014-09-20 DIAGNOSIS — M179 Osteoarthritis of knee, unspecified: Secondary | ICD-10-CM | POA: Diagnosis present

## 2014-09-20 DIAGNOSIS — M25569 Pain in unspecified knee: Secondary | ICD-10-CM | POA: Diagnosis not present

## 2014-09-20 DIAGNOSIS — Z886 Allergy status to analgesic agent status: Secondary | ICD-10-CM

## 2014-09-20 DIAGNOSIS — Z882 Allergy status to sulfonamides status: Secondary | ICD-10-CM | POA: Diagnosis not present

## 2014-09-20 DIAGNOSIS — Z8249 Family history of ischemic heart disease and other diseases of the circulatory system: Secondary | ICD-10-CM

## 2014-09-20 DIAGNOSIS — M1710 Unilateral primary osteoarthritis, unspecified knee: Secondary | ICD-10-CM | POA: Diagnosis present

## 2014-09-20 DIAGNOSIS — Z860100 Personal history of colon polyps, unspecified: Secondary | ICD-10-CM | POA: Diagnosis not present

## 2014-09-20 DIAGNOSIS — Z791 Long term (current) use of non-steroidal anti-inflammatories (NSAID): Secondary | ICD-10-CM | POA: Diagnosis not present

## 2014-09-20 DIAGNOSIS — Z7982 Long term (current) use of aspirin: Secondary | ICD-10-CM | POA: Diagnosis not present

## 2014-09-20 DIAGNOSIS — M109 Gout, unspecified: Secondary | ICD-10-CM | POA: Diagnosis present

## 2014-09-20 DIAGNOSIS — Z87442 Personal history of urinary calculi: Secondary | ICD-10-CM | POA: Diagnosis not present

## 2014-09-20 DIAGNOSIS — Z79899 Other long term (current) drug therapy: Secondary | ICD-10-CM

## 2014-09-20 DIAGNOSIS — Z888 Allergy status to other drugs, medicaments and biological substances status: Secondary | ICD-10-CM | POA: Diagnosis not present

## 2014-09-20 DIAGNOSIS — M898X9 Other specified disorders of bone, unspecified site: Secondary | ICD-10-CM | POA: Diagnosis present

## 2014-09-20 DIAGNOSIS — I10 Essential (primary) hypertension: Secondary | ICD-10-CM | POA: Diagnosis present

## 2014-09-20 DIAGNOSIS — M1711 Unilateral primary osteoarthritis, right knee: Secondary | ICD-10-CM

## 2014-09-20 HISTORY — PX: TOTAL KNEE ARTHROPLASTY: SHX125

## 2014-09-20 LAB — TYPE AND SCREEN
ABO/RH(D): A NEG
Antibody Screen: NEGATIVE

## 2014-09-20 SURGERY — ARTHROPLASTY, KNEE, TOTAL
Anesthesia: Spinal | Site: Knee | Laterality: Right

## 2014-09-20 MED ORDER — MEPERIDINE HCL 50 MG/ML IJ SOLN: 6.2500 mg | INTRAMUSCULAR | Status: DC | PRN

## 2014-09-20 MED ORDER — PROPOFOL INFUSION 10 MG/ML OPTIME
INTRAVENOUS | Status: DC | PRN
  Administered 2014-09-20: 100 ug/kg/min via INTRAVENOUS

## 2014-09-20 MED ORDER — BUPIVACAINE HCL (PF) 0.75 % IJ SOLN
INTRAMUSCULAR | Status: DC | PRN
  Administered 2014-09-20: 1.8 mL

## 2014-09-20 MED ORDER — SODIUM CHLORIDE 0.9 % IJ SOLN
INTRAMUSCULAR | Status: AC
  Filled 2014-09-20: qty 50

## 2014-09-20 MED ORDER — DIPHENHYDRAMINE HCL 12.5 MG/5ML PO ELIX: 12.5000 mg | ORAL_SOLUTION | ORAL | Status: DC | PRN

## 2014-09-20 MED ORDER — PROMETHAZINE HCL 25 MG/ML IJ SOLN: 6.2500 mg | INTRAMUSCULAR | Status: DC | PRN

## 2014-09-20 MED ORDER — ONDANSETRON HCL 4 MG PO TABS: 4.0000 mg | ORAL_TABLET | Freq: Four times a day (QID) | ORAL | Status: DC | PRN

## 2014-09-20 MED ORDER — PHENYLEPHRINE HCL 10 MG/ML IJ SOLN
INTRAMUSCULAR | Status: AC
  Filled 2014-09-20: qty 1

## 2014-09-20 MED ORDER — ONDANSETRON HCL 4 MG/2ML IJ SOLN: 4.0000 mg | Freq: Four times a day (QID) | INTRAMUSCULAR | Status: DC | PRN

## 2014-09-20 MED ORDER — PROPOFOL 10 MG/ML IV BOLUS
INTRAVENOUS | Status: AC
  Filled 2014-09-20: qty 20

## 2014-09-20 MED ORDER — MENTHOL 3 MG MT LOZG
1.0000 | LOZENGE | OROMUCOSAL | Status: DC | PRN
  Filled 2014-09-20: qty 9

## 2014-09-20 MED ORDER — KCL IN DEXTROSE-NACL 20-5-0.9 MEQ/L-%-% IV SOLN
INTRAVENOUS | Status: DC
Start: 2014-09-20 — End: 2014-09-22
  Administered 2014-09-20 – 2014-09-21 (×3): via INTRAVENOUS
  Filled 2014-09-20 (×5): qty 1000

## 2014-09-20 MED ORDER — SODIUM CHLORIDE 0.9 % IR SOLN
Status: DC | PRN
  Administered 2014-09-20: 1000 mL

## 2014-09-20 MED ORDER — FENTANYL CITRATE 0.05 MG/ML IJ SOLN
25.0000 ug | INTRAMUSCULAR | Status: DC | PRN
  Administered 2014-09-20: 25 ug via INTRAVENOUS

## 2014-09-20 MED ORDER — METOCLOPRAMIDE HCL 5 MG/ML IJ SOLN
5.0000 mg | Freq: Three times a day (TID) | INTRAMUSCULAR | Status: DC | PRN
Start: 2014-09-20 — End: 2014-09-22

## 2014-09-20 MED ORDER — HYDROMORPHONE HCL 2 MG PO TABS
2.0000 mg | ORAL_TABLET | ORAL | Status: DC | PRN
  Administered 2014-09-20: 2 mg via ORAL
  Administered 2014-09-21 – 2014-09-22 (×9): 4 mg via ORAL
  Filled 2014-09-20 (×8): qty 2
  Filled 2014-09-20: qty 1
  Filled 2014-09-20: qty 2

## 2014-09-20 MED ORDER — MIDAZOLAM HCL 2 MG/2ML IJ SOLN
INTRAMUSCULAR | Status: AC
  Filled 2014-09-20: qty 2

## 2014-09-20 MED ORDER — TRAMADOL HCL 50 MG PO TABS
50.0000 mg | ORAL_TABLET | Freq: Four times a day (QID) | ORAL | Status: DC | PRN
  Administered 2014-09-20: 100 mg via ORAL
  Administered 2014-09-20: 50 mg via ORAL
  Filled 2014-09-20: qty 1
  Filled 2014-09-20: qty 2

## 2014-09-20 MED ORDER — EPHEDRINE SULFATE 50 MG/ML IJ SOLN
INTRAMUSCULAR | Status: AC
  Filled 2014-09-20: qty 1

## 2014-09-20 MED ORDER — METOCLOPRAMIDE HCL 10 MG PO TABS: 5.0000 mg | ORAL_TABLET | Freq: Three times a day (TID) | ORAL | Status: DC | PRN

## 2014-09-20 MED ORDER — BUPIVACAINE LIPOSOME 1.3 % IJ SUSP
20.0000 mL | Freq: Once | INTRAMUSCULAR | Status: DC
  Filled 2014-09-20: qty 20

## 2014-09-20 MED ORDER — EPHEDRINE SULFATE 50 MG/ML IJ SOLN
INTRAMUSCULAR | Status: DC | PRN
  Administered 2014-09-20: 10 mg via INTRAVENOUS

## 2014-09-20 MED ORDER — POLYETHYLENE GLYCOL 3350 17 G PO PACK: 17.0000 g | PACK | Freq: Every day | ORAL | Status: DC | PRN

## 2014-09-20 MED ORDER — PHENYLEPHRINE HCL 10 MG/ML IJ SOLN
INTRAMUSCULAR | Status: DC | PRN
  Administered 2014-09-20 (×2): 80 ug via INTRAVENOUS
  Administered 2014-09-20: 40 ug via INTRAVENOUS
  Administered 2014-09-20 (×3): 80 ug via INTRAVENOUS

## 2014-09-20 MED ORDER — FENTANYL CITRATE 0.05 MG/ML IJ SOLN
INTRAMUSCULAR | Status: DC | PRN
  Administered 2014-09-20 (×2): 50 ug via INTRAVENOUS

## 2014-09-20 MED ORDER — FENTANYL CITRATE 0.05 MG/ML IJ SOLN
INTRAMUSCULAR | Status: AC
  Filled 2014-09-20: qty 2

## 2014-09-20 MED ORDER — SODIUM CHLORIDE 0.9 % IV SOLN: INTRAVENOUS | Status: DC

## 2014-09-20 MED ORDER — ONDANSETRON HCL 4 MG/2ML IJ SOLN
INTRAMUSCULAR | Status: DC | PRN
  Administered 2014-09-20: 4 mg via INTRAVENOUS

## 2014-09-20 MED ORDER — ACETAMINOPHEN 500 MG PO TABS
1000.0000 mg | ORAL_TABLET | Freq: Four times a day (QID) | ORAL | Status: AC
  Administered 2014-09-20 – 2014-09-21 (×4): 1000 mg via ORAL
  Filled 2014-09-20 (×4): qty 2

## 2014-09-20 MED ORDER — METHOCARBAMOL 1000 MG/10ML IJ SOLN
500.0000 mg | Freq: Four times a day (QID) | INTRAVENOUS | Status: DC | PRN
  Administered 2014-09-20: 500 mg via INTRAVENOUS
  Filled 2014-09-20: qty 5

## 2014-09-20 MED ORDER — ALLOPURINOL 300 MG PO TABS
300.0000 mg | ORAL_TABLET | Freq: Every morning | ORAL | Status: DC
  Administered 2014-09-21 – 2014-09-22 (×2): 300 mg via ORAL
  Filled 2014-09-20 (×2): qty 1

## 2014-09-20 MED ORDER — ACETAMINOPHEN 650 MG RE SUPP: 650.0000 mg | Freq: Four times a day (QID) | RECTAL | Status: DC | PRN

## 2014-09-20 MED ORDER — CHLORHEXIDINE GLUCONATE 4 % EX LIQD: 60.0000 mL | Freq: Once | CUTANEOUS | Status: DC

## 2014-09-20 MED ORDER — PHENYLEPHRINE 40 MCG/ML (10ML) SYRINGE FOR IV PUSH (FOR BLOOD PRESSURE SUPPORT)
PREFILLED_SYRINGE | INTRAVENOUS | Status: AC
  Filled 2014-09-20: qty 10

## 2014-09-20 MED ORDER — BISACODYL 10 MG RE SUPP: 10.0000 mg | Freq: Every day | RECTAL | Status: DC | PRN

## 2014-09-20 MED ORDER — ACETAMINOPHEN 10 MG/ML IV SOLN
1000.0000 mg | Freq: Once | INTRAVENOUS | Status: AC
  Administered 2014-09-20: 1000 mg via INTRAVENOUS
  Filled 2014-09-20: qty 100

## 2014-09-20 MED ORDER — ACETAMINOPHEN 325 MG PO TABS: 650.0000 mg | ORAL_TABLET | Freq: Four times a day (QID) | ORAL | Status: DC | PRN

## 2014-09-20 MED ORDER — DEXAMETHASONE SODIUM PHOSPHATE 10 MG/ML IJ SOLN
10.0000 mg | Freq: Once | INTRAMUSCULAR | Status: AC
  Administered 2014-09-20: 10 mg via INTRAVENOUS

## 2014-09-20 MED ORDER — SODIUM CHLORIDE 0.9 % IJ SOLN
INTRAMUSCULAR | Status: AC
  Filled 2014-09-20: qty 10

## 2014-09-20 MED ORDER — TRANEXAMIC ACID 100 MG/ML IV SOLN
1000.0000 mg | INTRAVENOUS | Status: AC
  Administered 2014-09-20: 1000 mg via INTRAVENOUS
  Filled 2014-09-20: qty 10

## 2014-09-20 MED ORDER — METOPROLOL TARTRATE 100 MG PO TABS
100.0000 mg | ORAL_TABLET | Freq: Two times a day (BID) | ORAL | Status: DC
  Administered 2014-09-21 – 2014-09-22 (×3): 100 mg via ORAL
  Filled 2014-09-20 (×6): qty 1

## 2014-09-20 MED ORDER — FLEET ENEMA 7-19 GM/118ML RE ENEM: 1.0000 | ENEMA | Freq: Once | RECTAL | Status: AC | PRN

## 2014-09-20 MED ORDER — DEXAMETHASONE SODIUM PHOSPHATE 10 MG/ML IJ SOLN
INTRAMUSCULAR | Status: AC
  Filled 2014-09-20: qty 1

## 2014-09-20 MED ORDER — MIDAZOLAM HCL 5 MG/5ML IJ SOLN
INTRAMUSCULAR | Status: DC | PRN
  Administered 2014-09-20: 2 mg via INTRAVENOUS

## 2014-09-20 MED ORDER — DOCUSATE SODIUM 100 MG PO CAPS
100.0000 mg | ORAL_CAPSULE | Freq: Two times a day (BID) | ORAL | Status: DC
  Administered 2014-09-20 – 2014-09-22 (×4): 100 mg via ORAL

## 2014-09-20 MED ORDER — ONDANSETRON HCL 4 MG/2ML IJ SOLN
INTRAMUSCULAR | Status: AC
  Filled 2014-09-20: qty 2

## 2014-09-20 MED ORDER — SODIUM CHLORIDE 0.9 % IJ SOLN
INTRAMUSCULAR | Status: DC | PRN
  Administered 2014-09-20: 30 mL via INTRAVENOUS

## 2014-09-20 MED ORDER — FENTANYL CITRATE 0.05 MG/ML IJ SOLN: 25.0000 ug | INTRAMUSCULAR | Status: DC | PRN

## 2014-09-20 MED ORDER — METHOCARBAMOL 500 MG PO TABS
500.0000 mg | ORAL_TABLET | Freq: Four times a day (QID) | ORAL | Status: DC | PRN
  Administered 2014-09-21 – 2014-09-22 (×3): 500 mg via ORAL
  Filled 2014-09-20 (×3): qty 1

## 2014-09-20 MED ORDER — BUPIVACAINE HCL 0.25 % IJ SOLN
INTRAMUSCULAR | Status: DC | PRN
  Administered 2014-09-20: 20 mL

## 2014-09-20 MED ORDER — CEFAZOLIN SODIUM-DEXTROSE 2-3 GM-% IV SOLR
2.0000 g | INTRAVENOUS | Status: AC
  Administered 2014-09-20: 2 g via INTRAVENOUS

## 2014-09-20 MED ORDER — LACTATED RINGERS IV SOLN
INTRAVENOUS | Status: DC
  Administered 2014-09-20 (×2): via INTRAVENOUS

## 2014-09-20 MED ORDER — DEXAMETHASONE SODIUM PHOSPHATE 10 MG/ML IJ SOLN
10.0000 mg | Freq: Once | INTRAMUSCULAR | Status: AC
  Administered 2014-09-21: 10 mg via INTRAVENOUS
  Filled 2014-09-20: qty 1

## 2014-09-20 MED ORDER — PHENOL 1.4 % MT LIQD: 1.0000 | OROMUCOSAL | Status: DC | PRN

## 2014-09-20 MED ORDER — BUPIVACAINE HCL (PF) 0.25 % IJ SOLN
INTRAMUSCULAR | Status: AC
  Filled 2014-09-20: qty 30

## 2014-09-20 MED ORDER — BUPIVACAINE LIPOSOME 1.3 % IJ SUSP
INTRAMUSCULAR | Status: DC | PRN
  Administered 2014-09-20: 20 mL

## 2014-09-20 MED ORDER — CEFAZOLIN SODIUM-DEXTROSE 2-3 GM-% IV SOLR
INTRAVENOUS | Status: AC
  Filled 2014-09-20: qty 50

## 2014-09-20 MED ORDER — HYDROCHLOROTHIAZIDE 25 MG PO TABS
25.0000 mg | ORAL_TABLET | Freq: Every morning | ORAL | Status: DC
  Administered 2014-09-21 – 2014-09-22 (×2): 25 mg via ORAL
  Filled 2014-09-20 (×2): qty 1

## 2014-09-20 MED ORDER — HYDROMORPHONE HCL 1 MG/ML IJ SOLN
0.5000 mg | INTRAMUSCULAR | Status: DC | PRN
  Administered 2014-09-21: 1 mg via INTRAVENOUS
  Filled 2014-09-20: qty 1

## 2014-09-20 MED ORDER — RIVAROXABAN 10 MG PO TABS
10.0000 mg | ORAL_TABLET | Freq: Every day | ORAL | Status: DC
  Administered 2014-09-21 – 2014-09-22 (×2): 10 mg via ORAL
  Filled 2014-09-20 (×3): qty 1

## 2014-09-20 MED ORDER — CEFAZOLIN SODIUM-DEXTROSE 2-3 GM-% IV SOLR
2.0000 g | Freq: Four times a day (QID) | INTRAVENOUS | Status: AC
  Administered 2014-09-20 – 2014-09-21 (×2): 2 g via INTRAVENOUS
  Filled 2014-09-20 (×2): qty 50

## 2014-09-20 MED ORDER — 0.9 % SODIUM CHLORIDE (POUR BTL) OPTIME
TOPICAL | Status: DC | PRN
  Administered 2014-09-20: 1000 mL

## 2014-09-20 SURGICAL SUPPLY — 61 items
BAG SPEC THK2 15X12 ZIP CLS (MISCELLANEOUS) ×1
BAG ZIPLOCK 12X15 (MISCELLANEOUS) ×3 IMPLANT
BANDAGE ELASTIC 6 VELCRO ST LF (GAUZE/BANDAGES/DRESSINGS) ×3 IMPLANT
BANDAGE ESMARK 6X9 LF (GAUZE/BANDAGES/DRESSINGS) ×1 IMPLANT
BLADE SAG 18X100X1.27 (BLADE) ×3 IMPLANT
BLADE SAW SGTL 11.0X1.19X90.0M (BLADE) ×3 IMPLANT
BNDG CMPR 9X6 STRL LF SNTH (GAUZE/BANDAGES/DRESSINGS) ×1
BNDG ESMARK 6X9 LF (GAUZE/BANDAGES/DRESSINGS) ×3
BOWL SMART MIX CTS (DISPOSABLE) ×3 IMPLANT
CAPT RP KNEE ×3 IMPLANT
CEMENT HV SMART SET (Cement) ×6 IMPLANT
CLOSURE WOUND 1/2 X4 (GAUZE/BANDAGES/DRESSINGS) ×1
CUFF TOURN SGL QUICK 34 (TOURNIQUET CUFF) ×3
CUFF TRNQT CYL 34X4X40X1 (TOURNIQUET CUFF) ×1 IMPLANT
DECANTER SPIKE VIAL GLASS SM (MISCELLANEOUS) ×3 IMPLANT
DRAPE EXTREMITY TIBURON (DRAPES) ×3 IMPLANT
DRAPE POUCH INSTRU U-SHP 10X18 (DRAPES) ×3 IMPLANT
DRAPE U-SHAPE 47X51 STRL (DRAPES) ×3 IMPLANT
DRSG ADAPTIC 3X8 NADH LF (GAUZE/BANDAGES/DRESSINGS) ×3 IMPLANT
DRSG PAD ABDOMINAL 8X10 ST (GAUZE/BANDAGES/DRESSINGS) ×3 IMPLANT
DURAPREP 26ML APPLICATOR (WOUND CARE) ×3 IMPLANT
ELECT REM PT RETURN 9FT ADLT (ELECTROSURGICAL) ×3
ELECTRODE REM PT RTRN 9FT ADLT (ELECTROSURGICAL) ×1 IMPLANT
EVACUATOR 1/8 PVC DRAIN (DRAIN) ×3 IMPLANT
FACESHIELD WRAPAROUND (MASK) ×15 IMPLANT
GAUZE SPONGE 4X4 12PLY STRL (GAUZE/BANDAGES/DRESSINGS) ×3 IMPLANT
GLOVE BIO SURGEON STRL SZ7.5 (GLOVE) IMPLANT
GLOVE BIO SURGEON STRL SZ8 (GLOVE) ×3 IMPLANT
GLOVE BIOGEL PI IND STRL 6.5 (GLOVE) IMPLANT
GLOVE BIOGEL PI IND STRL 8 (GLOVE) ×1 IMPLANT
GLOVE BIOGEL PI INDICATOR 6.5 (GLOVE)
GLOVE BIOGEL PI INDICATOR 8 (GLOVE) ×2
GLOVE SURG SS PI 6.5 STRL IVOR (GLOVE) IMPLANT
GOWN STRL REUS W/TWL LRG LVL3 (GOWN DISPOSABLE) ×3 IMPLANT
GOWN STRL REUS W/TWL XL LVL3 (GOWN DISPOSABLE) IMPLANT
HANDPIECE INTERPULSE COAX TIP (DISPOSABLE) ×3
IMMOBILIZER KNEE 20 (SOFTGOODS) ×6 IMPLANT
IMMOBILIZER KNEE 20 THIGH 36 (SOFTGOODS) ×1 IMPLANT
KIT BASIN OR (CUSTOM PROCEDURE TRAY) ×3 IMPLANT
MANIFOLD NEPTUNE II (INSTRUMENTS) ×3 IMPLANT
NDL SAFETY ECLIPSE 18X1.5 (NEEDLE) ×2 IMPLANT
NEEDLE HYPO 18GX1.5 SHARP (NEEDLE) ×6
NS IRRIG 1000ML POUR BTL (IV SOLUTION) ×3 IMPLANT
PACK TOTAL JOINT (CUSTOM PROCEDURE TRAY) ×3 IMPLANT
PAD ABD 8X10 STRL (GAUZE/BANDAGES/DRESSINGS) ×3 IMPLANT
PADDING CAST COTTON 6X4 STRL (CAST SUPPLIES) ×3 IMPLANT
POSITIONER SURGICAL ARM (MISCELLANEOUS) ×3 IMPLANT
SET HNDPC FAN SPRY TIP SCT (DISPOSABLE) ×1 IMPLANT
STRIP CLOSURE SKIN 1/2X4 (GAUZE/BANDAGES/DRESSINGS) ×2 IMPLANT
SUCTION FRAZIER 12FR DISP (SUCTIONS) ×3 IMPLANT
SUT MNCRL AB 4-0 PS2 18 (SUTURE) ×3 IMPLANT
SUT VIC AB 2-0 CT1 27 (SUTURE) ×9
SUT VIC AB 2-0 CT1 TAPERPNT 27 (SUTURE) ×3 IMPLANT
SUT VLOC 180 0 24IN GS25 (SUTURE) ×3 IMPLANT
SYR 20CC LL (SYRINGE) ×3 IMPLANT
SYR 50ML LL SCALE MARK (SYRINGE) ×3 IMPLANT
TOWEL OR 17X26 10 PK STRL BLUE (TOWEL DISPOSABLE) ×3 IMPLANT
TOWEL OR NON WOVEN STRL DISP B (DISPOSABLE) IMPLANT
TRAY FOLEY CATH 14FRSI W/METER (CATHETERS) ×3 IMPLANT
WATER STERILE IRR 1500ML POUR (IV SOLUTION) ×3 IMPLANT
WRAP KNEE MAXI GEL POST OP (GAUZE/BANDAGES/DRESSINGS) ×3 IMPLANT

## 2014-09-20 NOTE — Progress Notes (Signed)
Utilization review completed.  

## 2014-09-20 NOTE — Anesthesia Postprocedure Evaluation (Signed)
  Anesthesia Post-op Note  Patient: Nicholas Lewis  Procedure(s) Performed: Procedure(s): RIGHT TOTAL KNEE ARTHROPLASTY (Right)  Patient Location: PACU  Anesthesia Type:General  Level of Consciousness: awake, alert  and oriented  Airway and Oxygen Therapy: Patient Spontanous Breathing  Post-op Pain: mild  Post-op Assessment: Post-op Vital signs reviewed  Post-op Vital Signs: Reviewed and stable  Last Vitals:  Filed Vitals:   09/20/14 1415  BP: 112/57  Pulse: 72  Temp:   Resp: 14    Complications: No apparent anesthesia complications

## 2014-09-20 NOTE — Anesthesia Procedure Notes (Signed)
Spinal  Patient location during procedure: OR Start time: 09/20/2014 12:20 PM End time: 09/20/2014 12:24 PM Staffing Anesthesiologist: Alexis Frock CRNA/Resident: Lajuana Carry E Performed by: resident/CRNA  Preanesthetic Checklist Completed: patient identified, site marked, surgical consent, pre-op evaluation, timeout performed, IV checked, risks and benefits discussed and monitors and equipment checked Spinal Block Patient position: sitting Prep: Betadine Patient monitoring: heart rate, continuous pulse ox and blood pressure Approach: midline Location: L3-4 Injection technique: single-shot Needle Needle type: Sprotte  Needle gauge: 24 G Needle length: 10 cm Additional Notes Kit expiration checked, time out performed. Sitting position, sterile prep and drape, lido local to L3-4, 24g sprotte to same, clear CSF pre/post injection, neg heme, neg paresthesia. Tol well

## 2014-09-20 NOTE — Interval H&P Note (Signed)
History and Physical Interval Note:  09/20/2014 11:33 AM  Nicholas Lewis  has presented today for surgery, with the diagnosis of OA RIGHT KNEE  The various methods of treatment have been discussed with the patient and family. After consideration of risks, benefits and other options for treatment, the patient has consented to  Procedure(s): RIGHT TOTAL KNEE ARTHROPLASTY (Right) as a surgical intervention .  The patient's history has been reviewed, patient examined, no change in status, stable for surgery.  I have reviewed the patient's chart and labs.  Questions were answered to the patient's satisfaction.     Gearlean Alf

## 2014-09-20 NOTE — Op Note (Signed)
Pre-operative diagnosis- Osteoarthritis  Right knee(s)  Post-operative diagnosis- Osteoarthritis Right knee(s)  Procedure-  Right  Total Knee Arthroplasty  Surgeon- Dione Plover. Ashdon Gillson, MD  Assistant- Arlee Muslim, PA-C   Anesthesia-  Spinal  EBL-* No blood loss amount entered *   Drains Hemovac  Tourniquet time- 36 minutes @ 782 mm Hg  Complications- None  Condition-PACU - hemodynamically stable.   Brief Clinical Note  STEPFON Lewis is a 73 y.o. year old male with end stage OA of his right knee with progressively worsening pain and dysfunction. He has constant pain, with activity and at rest and significant functional deficits with difficulties even with ADLs. He has had extensive non-op management including analgesics, injections of cortisone and viscosupplements, and home exercise program, but remains in significant pain with significant dysfunction. Radiographs show bone on bone arthritis medial and patellofemoral. He presents now for right Total Knee Arthroplasty.    Procedure in detail---   The patient is brought into the operating room and positioned supine on the operating table. After successful administration of  Spinal,   a tourniquet is placed high on the  Right thigh(s) and the lower extremity is prepped and draped in the usual sterile fashion. Time out is performed by the operating team and then the  Right lower extremity is wrapped in Esmarch, knee flexed and the tourniquet inflated to 300 mmHg.       A midline incision is made with a ten blade through the subcutaneous tissue to the level of the extensor mechanism. A fresh blade is used to make a medial parapatellar arthrotomy. Soft tissue over the proximal medial tibia is subperiosteally elevated to the joint line with a knife and into the semimembranosus bursa with a Cobb elevator. Soft tissue over the proximal lateral tibia is elevated with attention being paid to avoiding the patellar tendon on the tibial tubercle. The  patella is everted, knee flexed 90 degrees and the ACL and PCL are removed. Findings are bone on bone medial and patellofemoral with massive global osteophytes.        The drill is used to create a starting hole in the distal femur and the canal is thoroughly irrigated with sterile saline to remove the fatty contents. The 5 degree Right  valgus alignment guide is placed into the femoral canal and the distal femoral cutting block is pinned to remove 10 mm off the distal femur. Resection is made with an oscillating saw.      The tibia is subluxed forward and the menisci are removed. The extramedullary alignment guide is placed referencing proximally at the medial aspect of the tibial tubercle and distally along the second metatarsal axis and tibial crest. The block is pinned to remove 46mm off the more deficient medial  side. Resection is made with an oscillating saw. Size 5is the most appropriate size for the tibia and the proximal tibia is prepared with the modular drill and keel punch for that size.      The femoral sizing guide is placed and size 5 is most appropriate. Rotation is marked off the epicondylar axis and confirmed by creating a rectangular flexion gap at 90 degrees. The size 5 cutting block is pinned in this rotation and the anterior, posterior and chamfer cuts are made with the oscillating saw. The intercondylar block is then placed and that cut is made.      Trial size 5 tibial component, trial size 5 posterior stabilized femur and a 12.5  mm posterior stabilized  rotating platform insert trial is placed. Full extension is achieved with excellent varus/valgus and anterior/posterior balance throughout full range of motion. The patella is everted and thickness measured to be 25  mm. Free hand resection is taken to 15 mm, a 38 template is placed, lug holes are drilled, trial patella is placed, and it tracks normally. Osteophytes are removed off the posterior femur with the trial in place. All trials  are removed and the cut bone surfaces prepared with pulsatile lavage. Cement is mixed and once ready for implantation, the size 5 tibial implant, size  5 posterior stabilized femoral component, and the size 38 patella are cemented in place and the patella is held with the clamp. The trial insert is placed and the knee held in full extension. The Exparel (20 ml mixed with 30 ml saline) and .25% Bupivicaine, are injected into the extensor mechanism, posterior capsule, medial and lateral gutters and subcutaneous tissues.  All extruded cement is removed and once the cement is hard the permanent 12.5 mm posterior stabilized rotating platform insert is placed into the tibial tray.      The wound is copiously irrigated with saline solution and the extensor mechanism closed over a hemovac drain with #1 V-loc suture. The tourniquet is released for a total tourniquet time of 36  minutes. Flexion against gravity is 140 degrees and the patella tracks normally. Subcutaneous tissue is closed with 2.0 vicryl and subcuticular with running 4.0 Monocryl. The incision is cleaned and dried and steri-strips and a bulky sterile dressing are applied. The limb is placed into a knee immobilizer and the patient is awakened and transported to recovery in stable condition.      Please note that a surgical assistant was a medical necessity for this procedure in order to perform it in a safe and expeditious manner. Surgical assistant was necessary to retract the ligaments and vital neurovascular structures to prevent injury to them and also necessary for proper positioning of the limb to allow for anatomic placement of the prosthesis.   Dione Plover Nashya Garlington, MD    09/20/2014, 1:26 PM

## 2014-09-20 NOTE — H&P (View-Only) (Signed)
Nicholas Lewis. Nicholas Lewis DOB: Dec 28, 1941 Married / Language: English / Race: White Male Date of Admission:  09/20/2014 Chief Complaint:  Right Knee Pain History of Present Illness  The patient is a 73 year old male who comes in for a preoperative History and Physical. The patient is scheduled for a right total knee arthroplasty to be performed by Dr. Dione Plover. Aluisio, MD at [facility] on 09-20-2014. The patient is a 73 year old male who presents with knee complaints. The patient is seen after transfer of care from Dr. Shellia Lewis. The patient reports right knee symptoms including: pain, instability, catching, soreness and grinding which began year(s) ago.The patient feels that the symptoms are worsening. The patient has the current diagnosis of knee osteoarthritis. Previous work-up for this problem has included knee x-rays. Past treatment for this problem has included intra-articular injection of corticosteroids (he also finished a repeat series of Euflexxa a little over two years ago). Current treatment includes elastic bandage (occasionally) and nonsteroidal anti-inflammatory drugs (Advil; usually around 6 tabs per day). Risk factors include total knee replacement (He had a left total knee by Dr. Shellia Lewis in 2013. The left knee is doing very well.). Note for "Knee pain": He discussed having the right knee replaced at his last office visit with Dr. Shellia Lewis. He is ready to start moving forward with that, at this time. He had a previous total knee arthroplasty on the left knee by Dr. Shellia Lewis, which is doing quite well. He has had previous cortisone shots and Visco supplementations into his right knee. The last time being two years ago for the Visco. Here recently he has had more discomfort in his right knee. The left knee, again, is not giving him any discomfort or problems. The right knee crunches, grinds, and occasionally feels like it may give way. He is ready to proceed with surgery. Risks and  benefits of the surgery have been discussed with the patient and they elect to proceed with surgery.  There are on active contraindications to upcoming procedure such as ongoing infection or progressive neurological disease.   Problem List/Past Medical  Status post total left knee replacement (V43.65  [id number]) Primary osteoarthritis of right knee (715.16  M17.11)  Allergies Sulfa Drugs visual changes Mobic *ANALGESICS - ANTI-INFLAMMATORY* Swelling. Face flush  Family History  Hypertension Father. Severe allergy Sister. Other medical problems Mother had Schlerderma (not sure of spelling) Osteoarthritis mother First Degree Relatives reported Cancer Father, Sister. father  Social History Exercise Exercises rarely; does individual sport Exercises daily; does running / walking Current work status retired Children 1 2 Pain Contract no Tobacco use Never smoker. 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 Saw Palmetto (Oral) Specific dose unknown - Active. Hydrochlorothiazide (25MG  Tablet, Oral) Active. Metoprolol Succinate (100MG  Tablet ER, Oral) Active. Aspirin EC (81MG  Tablet DR, Oral) Active. One-A-Day Mens (1 Oral) Active. Allopurinol (300MG  Tablet, Oral) Active.  Past Surgical History  Colon Polyp Removal - Colonoscopy Total Knee Replacement - Left08/06/2012 by Dr. Shellia Lewis  Past Medical History Kidney  Stone High blood pressure Gout  Osteoarthritis Autoimmune disorder child Cataract Stable, checked every year Hemorrhoids Degenerative Disc Disease Spondylolisthesis, Acquired (738.4) Radiculitis, Thoracic or Lumbar (724.4)  Review of Systems General Not Present- Chills, Fatigue, Fever, Memory Loss, Night Sweats, Weight Gain and Weight Loss. Skin Not Present- Eczema, Hives, Itching, Lesions and Rash. HEENT Not Present- Dentures, Double Vision, Headache, Hearing Loss, Tinnitus and Visual Loss. Respiratory Not Present- Allergies, Chronic Cough, Coughing up blood, Shortness of breath at rest and Shortness of breath with exertion. Cardiovascular Not Present- Chest Pain, Difficulty Breathing Lying Down, Murmur, Palpitations, Racing/skipping heartbeats and Swelling. Gastrointestinal Not Present- Abdominal Pain, Bloody Stool, Constipation, Diarrhea, Difficulty Swallowing, Heartburn, Jaundice, Loss of appetitie, Nausea and Vomiting. Male Genitourinary Not Present- Blood in Urine, Discharge, Flank Pain, Incontinence, Painful Urination, Urgency, Urinary frequency, Urinary Retention, Urinating at Night and Weak urinary stream. Musculoskeletal Present- Joint Pain and Joint Swelling. Not Present- Back Pain, Morning Stiffness, Muscle Pain, Muscle Weakness and Spasms. Neurological Not Present- Blackout spells, Difficulty with balance, Dizziness, Paralysis, Tremor and Weakness. Psychiatric Not Present- Insomnia.   Vitals Weight: 225 lb Height: 72in Body Surface Area: 2.28 m Body Mass Index: 30.52 kg/m Pulse: 54 (Regular)  Resp.: 12 (Unlabored)  BP: 124/74 (Sitting, Right Arm, Standard) Bradycardia noted. On metoprolol.   Physical Exam The physical exam findings are as follows: Note:Patient is a 73 year old male with continued knee pain. Patient is accompanied today by his wife Nicholas Lewis.  General Mental Status -Alert, cooperative and good historian. General  Appearance-pleasant, Not in acute distress. Orientation-Oriented X3. Build & Nutrition-Well nourished and Well developed.  Head and Neck Head-normocephalic, atraumatic . Neck Global Assessment - supple, no bruit auscultated on the right, no bruit auscultated on the left.  Eye Vision-Wears corrective lenses. Pupil - Bilateral-Regular and Round. Motion - Bilateral-EOMI.  Chest and Lung Exam Auscultation Breath sounds - clear at anterior chest wall and clear at posterior chest wall. Adventitious sounds - No Adventitious sounds.  Cardiovascular Auscultation Rhythm - Bradycardic(otherwise regular rhythm.). Heart Sounds - S1 WNL and S2 WNL. Murmurs & Other Heart Sounds - Auscultation of the heart reveals - No Murmurs.  Abdomen Palpation/Percussion Tenderness - Abdomen is non-tender to palpation. Rigidity (guarding) - Abdomen is soft. Auscultation Auscultation of the abdomen reveals - Bowel sounds normal.  Male Genitourinary Note: Not done, not pertinent to present illness   Musculoskeletal Note: Full extension and further flexion to 115 degrees with tightness. Negative McMurray's. Moderate patellofemoral crepitation. Sensation and circulation are intact.  Assessment & Plan  Primary osteoarthritis of right knee (715.16  M17.11) Note:Plan is for a Right Total Knee Replacement by Dr. Wynelle Link.  Plan is to go home.  PCP - Dr. Sharilyn Sites  The patient does not have any contraindications and will receive TXA (tranexamic acid) prior to surgery.  Signed electronically by Joelene Millin, III PA-C

## 2014-09-20 NOTE — Anesthesia Preprocedure Evaluation (Addendum)
Anesthesia Evaluation  Patient identified by MRN, date of birth, ID band Patient awake    Reviewed: Allergy & Precautions, H&P , NPO status   Airway       Dental   Pulmonary          Cardiovascular hypertension, Pt. on medications     Neuro/Psych    GI/Hepatic   Endo/Other    Renal/GU      Musculoskeletal   Abdominal   Peds  Hematology   Anesthesia Other Findings   Reproductive/Obstetrics                         Anesthesia Physical Anesthesia Plan  ASA: II  Anesthesia Plan: Spinal   Post-op Pain Management:    Induction:   Airway Management Planned: Nasal Cannula  Additional Equipment:   Intra-op Plan:   Post-operative Plan:   Informed Consent: I have reviewed the patients History and Physical, chart, labs and discussed the procedure including the risks, benefits and alternatives for the proposed anesthesia with the patient or authorized representative who has indicated his/her understanding and acceptance.     Plan Discussed with:   Anesthesia Plan Comments:         Anesthesia Quick Evaluation

## 2014-09-20 NOTE — Transfer of Care (Signed)
Immediate Anesthesia Transfer of Care Note  Patient: Nicholas Lewis  Procedure(s) Performed: Procedure(s) (LRB): RIGHT TOTAL KNEE ARTHROPLASTY (Right)  Patient Location: PACU  Anesthesia Type: Spinal  Level of Consciousness: sedated, patient cooperative and responds to stimulation  Airway & Oxygen Therapy: Patient Spontanous Breathing and Patient connected to face mask oxgen  Post-op Assessment: Report given to PACU RN and Post -op Vital signs reviewed and stable  Post vital signs: Reviewed and stable  Complications: No apparent anesthesia complications

## 2014-09-21 ENCOUNTER — Encounter (HOSPITAL_COMMUNITY): Payer: Self-pay | Admitting: Orthopedic Surgery

## 2014-09-21 LAB — BASIC METABOLIC PANEL
Anion gap: 8 (ref 5–15)
BUN: 15 mg/dL (ref 6–23)
CO2: 28 mEq/L (ref 19–32)
Calcium: 8.5 mg/dL (ref 8.4–10.5)
Chloride: 107 mEq/L (ref 96–112)
Creatinine, Ser: 0.83 mg/dL (ref 0.50–1.35)
GFR calc Af Amer: >90 mL/min (ref >90)
GFR calc non Af Amer: 86 mL/min — ABNORMAL LOW (ref >90)
Glucose, Bld: 156 mg/dL — ABNORMAL HIGH (ref 70–99)
Potassium: 3.9 mEq/L (ref 3.7–5.3)
Sodium: 143 mEq/L (ref 137–147)

## 2014-09-21 LAB — CBC
HCT: 35.4 % — ABNORMAL LOW (ref 39.0–52.0)
Hemoglobin: 12.4 g/dL — ABNORMAL LOW (ref 13.0–17.0)
MCH: 31.2 pg (ref 26.0–34.0)
MCHC: 35 g/dL (ref 30.0–36.0)
MCV: 89.2 fL (ref 78.0–100.0)
Platelets: 152 10*3/uL (ref 150–400)
RBC: 3.97 MIL/uL — ABNORMAL LOW (ref 4.22–5.81)
RDW: 13.4 % (ref 11.5–15.5)
WBC: 12.5 10*3/uL — ABNORMAL HIGH (ref 4.0–10.5)

## 2014-09-21 NOTE — Progress Notes (Signed)
Agree with previous RN assessment, will continue to monitor pt  

## 2014-09-21 NOTE — Discharge Instructions (Addendum)
° °Dr. Frank Aluisio °Total Joint Specialist °[facility] °[address] °[place], [place] [address] °[phone] ° °TOTAL KNEE REPLACEMENT POSTOPERATIVE DIRECTIONS ° ° ° °Knee Rehabilitation, Guidelines Following Surgery  °Results after knee surgery are often greatly improved when you follow the exercise, range of motion and muscle strengthening exercises prescribed by your doctor. Safety measures are also important to protect the knee from further injury. Any time any of these exercises cause you to have increased pain or swelling in your knee joint, decrease the amount until you are comfortable again and slowly increase them. If you have problems or questions, call your caregiver or physical therapist for advice.  ° °HOME CARE INSTRUCTIONS  °Remove items at home which could result in a fall. This includes throw rugs or furniture in walking pathways.  °Continue medications as instructed at time of discharge. °You may have some home medications which will be placed on hold until you complete the course of blood thinner medication.  °You may start showering once you are discharged home but do not submerge the incision under water. Just pat the incision dry and apply a dry gauze dressing on daily. °Walk with walker as instructed.  °You may resume a sexual relationship in one month or when given the OK by  your doctor.  °· Use walker as long as suggested by your caregivers. °· Avoid periods of inactivity such as sitting longer than an hour when not asleep. This helps prevent blood clots.  °You may put full weight on your legs and walk as much as is comfortable.  °You may return to work once you are cleared by your doctor.  °Do not drive a car for 6 weeks or until released by you surgeon.  °· Do not drive while taking narcotics.  °Wear the elastic stockings for three weeks following surgery during the day but you may remove then at night. °Make sure you keep all of your appointments after your  operation with all of your doctors and caregivers. You should call the office at the above phone number and make an appointment for approximately two weeks after the date of your surgery. °Change the dressing daily and reapply a dry dressing each time. °Please pick up a stool softener and laxative for home use as long as you are requiring pain medications. °· Continue to use ice on the knee for pain and swelling from surgery. You may notice swelling that will progress down to the foot and ankle.  This is normal after surgery.  Elevate the leg when you are not up walking on it.   °It is important for you to complete the blood thinner medication as prescribed by your doctor. °· Continue to use the breathing machine which will help keep your temperature down.  It is common for your temperature to cycle up and down following surgery, especially at night when you are not up moving around and exerting yourself.  The breathing machine keeps your lungs expanded and your temperature down. ° °RANGE OF MOTION AND STRENGTHENING EXERCISES  °Rehabilitation of the knee is important following a knee injury or an operation. After just a few days of immobilization, the muscles of the thigh which control the knee become weakened and shrink (atrophy). Knee exercises are designed to build up the tone and strength of the thigh muscles and to improve knee motion. Often times heat used for twenty to thirty minutes before working out will loosen up your tissues and help with improving the   range of motion but do not use heat for the first two weeks following surgery. These exercises can be done on a training (exercise) mat, on the floor, on a table or on a bed. Use what ever works the best and is most comfortable for you Knee exercises include:  Leg Lifts - While your knee is still immobilized in a splint or cast, you can do straight leg raises. Lift the leg to 60 degrees, hold for 3 sec, and slowly lower the leg. Repeat 10-20 times 2-3  times daily. Perform this exercise against resistance later as your knee gets better.  Quad and Hamstring Sets - Tighten up the muscle on the front of the thigh (Quad) and hold for 5-10 sec. Repeat this 10-20 times hourly. Hamstring sets are done by pushing the foot backward against an object and holding for 5-10 sec. Repeat as with quad sets.  A rehabilitation program following serious knee injuries can speed recovery and prevent re-injury in the future due to weakened muscles. Contact your doctor or a physical therapist for more information on knee rehabilitation.   SKILLED REHAB INSTRUCTIONS: If the patient is transferred to a skilled rehab facility following release from the hospital, a list of the current medications will be sent to the facility for the patient to continue.  When discharged from the skilled rehab facility, please have the facility set up the patient's [place] prior to being released. Also, the skilled facility will be responsible for providing the patient with their medications at time of release from the facility to include their pain medication, the muscle relaxants, and their blood thinner medication. If the patient is still at the rehab facility at time of the two week follow up appointment, the skilled rehab facility will also need to assist the patient in arranging follow up appointment in our office and any transportation needs.  MAKE SURE YOU:  Understand these instructions.  Will watch your condition.  Will get help right away if you are not doing well or get worse.    Pick up stool softner and laxative for home. Do not submerge incision under water. May shower. Continue to use ice for pain and swelling from surgery.  Take Xarelto for two and a half more weeks, then discontinue Xarelto. Once the patient has completed the Xarelto, they may resume the 81 mg Aspirin.  Information on my medicine - XARELTO (Rivaroxaban)  This medication education  was reviewed with me or my healthcare representative as part of my discharge preparation.  The pharmacist that spoke with me during my hospital stay was:  Altha Harm  Why was Xarelto prescribed for you? Xarelto was prescribed for you to reduce the risk of blood clots forming after orthopedic surgery. The medical term for these abnormal blood clots is venous thromboembolism (VTE).  What do you need to know about xarelto ? Take your Xarelto ONCE DAILY at the same time every day. You may take it either with or without food.  If you have difficulty swallowing the tablet whole, you may crush it and mix in applesauce just prior to taking your dose.  Take Xarelto exactly as prescribed by your doctor and DO NOT stop taking Xarelto without talking to the doctor who prescribed the medication.  Stopping without other VTE prevention medication to take the place of Xarelto may increase your risk of developing a clot.  After discharge, you should have regular check-up appointments with your healthcare provider that is prescribing your Xarelto.  What do you do if you miss a dose? If you miss a dose, take it as soon as you remember on the same day then continue your regularly scheduled once daily regimen the next day. Do not take two doses of Xarelto on the same day.   Important Safety Information A possible side effect of Xarelto is bleeding. You should call your healthcare provider right away if you experience any of the following:   Bleeding from an injury or your nose that does not stop.   Unusual colored urine (red or dark brown) or unusual colored stools (red or black).   Unusual bruising for unknown reasons.   A serious fall or if you hit your head (even if there is no bleeding).  Some medicines may interact with Xarelto and might increase your risk of bleeding while on Xarelto. To help avoid this, consult your healthcare provider or pharmacist prior to using any new prescription or  non-prescription medications, including herbals, vitamins, non-steroidal anti-inflammatory drugs (NSAIDs) and supplements.  This website has more information on Xarelto: [email or web address]

## 2014-09-21 NOTE — Progress Notes (Signed)
   Subjective: 1 Day Post-Op Procedure(s) (LRB): RIGHT TOTAL KNEE ARTHROPLASTY (Right) Patient reports pain as mild and moderate pain last night but better this morning. Patient seen in rounds with Dr. Wynelle Link.  Wife in room at bedside. Patient is well, but has had some minor complaints of pain in the knee, requiring pain medications.  The IV pain meds caused him to have some sweats last night, likely dropped his pressures.  Low pressures noted.  Encourage the PO med for control today. Denies any CP/SOB/Nausea. We will start therapy today.  Plan is to go Home after hospital stay.  Objective: Vital signs in last 24 hours: Temp:  [97.5 F (36.4 C)-98 F (36.7 C)] 97.5 F (36.4 C) (09/22 0509) Pulse Rate:  [58-76] 58 (09/22 0509) Resp:  [13-16] 16 (09/22 0509) BP: (98-125)/(55-70) 114/67 mmHg (09/22 0509) SpO2:  [94 %-100 %] 97 % (09/22 0509) Weight:  [94.802 kg (209 lb)] 94.802 kg (209 lb) (09/21 1549)  Intake/Output from previous day:  Intake/Output Summary (Last 24 hours) at 09/21/14 0936 Last data filed at 09/21/14 0837  Gross per 24 hour  Intake 5121.67 ml  Output   2875 ml  Net 2246.67 ml    Intake/Output this shift: UOP 800 since MN  Labs:  Recent Labs  09/21/14 0431  HGB 12.4*    Recent Labs  09/21/14 0431  WBC 12.5*  RBC 3.97*  HCT 35.4*  PLT 152    Recent Labs  09/21/14 0431  NA 143  K 3.9  CL 107  CO2 28  BUN 15  CREATININE 0.83  GLUCOSE 156*  CALCIUM 8.5   No results found for this basename: LABPT, INR,  in the last 72 hours  EXAM General - Patient is Alert, Appropriate and Oriented Extremity - Neurovascular intact Sensation intact distally Dorsiflexion/Plantar flexion intact Dressing - dressing C/D/I Motor Function - intact, moving foot and toes well on exam.  Hemovac pulled without difficulty.  Past Medical History  Diagnosis Date  . Complication of anesthesia     slow to wake up  . Hypertension   . Arthritis   . Gout   .  Abrasion     l arm  . Cancer     skin  . Cellulitis 07/2012    after surgery for left knee    Assessment/Plan: 1 Day Post-Op Procedure(s) (LRB): RIGHT TOTAL KNEE ARTHROPLASTY (Right) Principal Problem:   OA (osteoarthritis) of knee  Estimated body mass index is 28.34 kg/(m^2) as calculated from the following:   Height as of this encounter: 6' (1.829 m).   Weight as of this encounter: 94.802 kg (209 lb). Advance diet Up with therapy Plan for discharge tomorrow Discharge home with home health  DVT Prophylaxis - Xarelto Weight-Bearing as tolerated to right leg D/C O2 and Pulse OX and try on Room Air  Arlee Muslim, PA-C Orthopaedic Surgery 09/21/2014, 9:36 AM

## 2014-09-21 NOTE — Evaluation (Signed)
Occupational Therapy Evaluation Patient Details Name: Nicholas Lewis MRN: [id number] DOB: 05/21/1941 Today's Date: 09/21/2014    History of Present Illness Pt admitted for R TKA   Clinical Impression   Pt admitted for the above diagnosis and has the deficits listed below.  Pt would benefit from 1-2 more sessions of OT to address toilet and shower transfers before returning home with his wife.     Follow Up Recommendations  No OT follow up    Equipment Recommendations  None recommended by OT    Recommendations for Other Services       Precautions / Restrictions Precautions Precautions: Fall Required Braces or Orthoses: Knee Immobilizer - Right Restrictions Weight Bearing Restrictions: No      Mobility Bed Mobility Overal bed mobility: Needs Assistance Bed Mobility: Supine to Sit     Supine to sit: Min guard;HOB elevated (HOB at 40 degrees)     General bed mobility comments: Pt did well getting to EOB.  Was able to do straight leg raise w/o assist.  Transfers Overall transfer level: Needs assistance Equipment used: 1 person hand held assist;Rolling walker (2 wheeled) Transfers: Sit to/from [employer] Sit to Stand: Min guard Stand pivot transfers: Min assist       General transfer comment: Pt required cues for step sequence as well as cues for hand and operated leg placement.    Balance Overall balance assessment: No apparent balance deficits (not formally assessed)                                          ADL Overall ADL's : Needs assistance/impaired Eating/Feeding: Set up   Grooming: Set up;Sitting   Upper Body Bathing: Set up;Sitting   Lower Body Bathing: Minimal assistance;Sit to/from stand Lower Body Bathing Details (indicate cue type and reason): assist to reach R foot. Upper Body Dressing : Set up;Sitting   Lower Body Dressing: Minimal assistance;Sit to/from stand Lower Body Dressing Details (indicate cue  type and reason): assist for R sock and shoe Toilet Transfer: Minimal assistance;Comfort height toilet   Toileting- Clothing Manipulation and Hygiene: Min guard;Sit to/from stand       Functional mobility during ADLs: Min guard;Rolling walker General ADL Comments: Pt did very well with adls requiring cues for technique and assist to reach R foot during adls.      Vision                     Perception     Praxis      Pertinent Vitals/Pain Pain Assessment: 0-10 Pain Score: 3  Pain Location: R knee Pain Descriptors / Indicators: Aching Pain Intervention(s): Repositioned;Ice applied;Monitored during session;RN gave pain meds during session     Hand Dominance Right   Extremity/Trunk Assessment Upper Extremity Assessment Upper Extremity Assessment: Overall WFL for tasks assessed (pt has torn ligament in L bicep area)   Lower Extremity Assessment Lower Extremity Assessment: Defer to PT evaluation   Cervical / Trunk Assessment Cervical / Trunk Assessment: Normal   Communication Communication Communication: No difficulties   Cognition Arousal/Alertness: Awake/alert Behavior During Therapy: WFL for tasks assessed/performed Overall Cognitive Status: Within Functional Limits for tasks assessed                     General Comments       Exercises       Shoulder  Instructions      Home Living Family/patient expects to be discharged to:: Private residence Living Arrangements: Spouse/significant other Available Help at Discharge: Available 24 hours/day;Family Type of Home: House Home Access: Stairs to enter [employer] of Steps: 1   Home Layout: One level     Bathroom Shower/Tub: Walk-in shower;Door   Bathroom Toilet: Handicapped height Bathroom Accessibility: No   Home Equipment: Environmental consultant - 2 wheels;Bedside commode          Prior Functioning/Environment Level of Independence: Independent             OT Diagnosis: Generalized  weakness;Acute pain   OT Problem List: Decreased knowledge of use of DME or AE;Pain   OT Treatment/Interventions: Self-care/ADL training;DME and/or AE instruction    OT Goals(Current goals can be found in the care plan section) Acute Rehab OT Goals 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 Goal Formulation: With patient Time For Goal Achievement: 09/28/14 Potential to Achieve Goals: Good ADL Goals Pt Will Transfer to Toilet: with supervision;ambulating Pt Will Perform Toileting - Clothing Manipulation and hygiene: with supervision;sit to/from stand Pt Will Perform Tub/Shower Transfer: Shower transfer;with supervision;rolling walker  OT Frequency: Min 2X/week   Barriers to D/C:    wife  home at all times       Co-evaluation              End of Session Equipment Utilized During Treatment: Rolling walker CPM Right Knee CPM Right Knee: Off Nurse Communication: Mobility status;Patient requests pain meds  Activity Tolerance: Patient tolerated treatment well Patient left: in chair;with call bell/phone within reach   Time: 1028-1100 OT Time Calculation (min): 32 min Charges:  OT General Charges $OT Visit: 1 Procedure OT Evaluation $Initial OT Evaluation Tier I: 1 Procedure OT Treatments $Self Care/Home Management : 23-37 mins G-Codes:    Glenford Peers, OTR/L 09/21/2014, 11:10 AM [phone]

## 2014-09-21 NOTE — Evaluation (Signed)
Physical Therapy Evaluation Patient Details Name: Nicholas Lewis MRN: [id number] DOB: Nov 05, 1941 Today's Date: 09/21/2014   History of Present Illness  Pt admitted for R TKA  Clinical Impression  Pt tolerated ambulation very well today.  Pt will benefit from PT to address problems listed  In note below.    Follow Up Recommendations Home health PT    Equipment Recommendations  None recommended by PT    Recommendations for Other Services       Precautions / Restrictions Precautions Precautions: Fall;Knee Required Braces or Orthoses: Knee Immobilizer - Right Restrictions Weight Bearing Restrictions: No      Mobility  Bed Mobility Overal bed mobility: Needs Assistance Bed Mobility: Supine to Sit     Supine to sit: Min guard;HOB elevated (HOB at 40 degrees)     General bed mobility comments: Pt did well getting to EOB.  Was able to do straight leg raise w/o assist.  Transfers Overall transfer level: Needs assistance Equipment used: Rolling walker (2 wheeled) Transfers: Sit to/from Stand Sit to Stand: Min guard Stand pivot transfers: Min assist       General transfer comment: cues for hand and R leg position  Ambulation/Gait Ambulation/Gait assistance: Min assist Ambulation Distance (Feet): 150 Feet Assistive device: Rolling walker (2 wheeled) Gait Pattern/deviations: Step-to pattern;Antalgic;Decreased stance time - right     General Gait Details: cues for position inside  RW.  Stairs            Wheelchair Mobility    Modified Rankin (Stroke Patients Only)       Balance Overall balance assessment: No apparent balance deficits (not formally assessed)                                           Pertinent Vitals/Pain Pain Assessment: 0-10 Pain Score: 3  Pain Location: R knee Pain Descriptors / Indicators: Aching Pain Intervention(s): Premedicated before session;Ice applied    Home Living Family/patient expects to be  discharged to:: Private residence Living Arrangements: Spouse/significant other Available Help at Discharge: Available 24 hours/day;Family Type of Home: House Home Access: Stairs to enter Entrance Stairs-Rails: None Entrance Stairs-Number of Steps: 1 Home Layout: One level Home Equipment: Environmental consultant - 2 wheels;Bedside commode      Prior Function Level of Independence: Independent               Hand Dominance   Dominant Hand: Right    Extremity/Trunk Assessment   Upper Extremity Assessment: Generalized weakness           Lower Extremity Assessment: RLE deficits/detail RLE Deficits / Details: performs SLR, knee flexion 60*    Cervical / Trunk Assessment: Normal  Communication   Communication: No difficulties  Cognition Arousal/Alertness: Awake/alert Behavior During Therapy: WFL for tasks assessed/performed Overall Cognitive Status: Within Functional Limits for tasks assessed                      General Comments      Exercises Total Joint Exercises Ankle Circles/Pumps: AROM;Both;10 reps;Seated Quad Sets: AROM;Both;10 reps;Supine Short Arc Quad: AAROM;Right;10 reps;Seated Long Arc Quad: AAROM;Right;10 reps;Seated Knee Flexion: AAROM;Right;10 reps;Seated Goniometric ROM: 10-60 R knee      Assessment/Plan    PT Assessment Patient needs continued PT services  PT Diagnosis Difficulty walking;Acute pain   PT Problem List Decreased strength;Decreased range of motion;Decreased activity tolerance;Decreased mobility;Pain;Decreased knowledge of use  of DME;Decreased safety awareness;Decreased knowledge of precautions  PT Treatment Interventions DME instruction;Gait training;Stair training;Functional mobility training;Therapeutic activities;Therapeutic exercise;Patient/family education   PT Goals (Current goals can be found in the Care Plan section) Acute Rehab PT Goals 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 Goal Formulation: With  patient Time For Goal Achievement: 09/28/14 Potential to Achieve Goals: Good    Frequency 7X/week   Barriers to discharge        Co-evaluation               End of Session Equipment Utilized During Treatment: Right knee immobilizer Activity Tolerance: Patient tolerated treatment well Patient left: in chair;with call bell/phone within reach;with family/visitor present Nurse Communication: Mobility status         Time: 1125-1145 PT Time Calculation (min): 20 min   Charges:   PT Evaluation $Initial PT Evaluation Tier I: 1 Procedure PT Treatments $Gait Training: 8-22 mins   PT G Codes:          Claretha Cooper 09/21/2014, 1:14 PM Tresa Endo PT [phone]

## 2014-09-21 NOTE — Progress Notes (Signed)
Physical Therapy Treatment Patient Details Name: Nicholas Lewis MRN: [id number] DOB: [birth year] Today's Date: 09/21/2014    History of Present Illness Pt admitted for R TKA    PT Comments    Patient progressing well.   Performs SLR.   Follow Up Recommendations  Home health PT     Equipment Recommendations  None recommended by PT    Recommendations for Other Services       Precautions / Restrictions Precautions Precautions: Fall;Knee Required Braces or Orthoses:  (did not use KI)    Mobility  Bed Mobility   Bed Mobility: Sit to Supine     Supine to sit: Min assist     General bed mobility comments: R leg onto bed.  Transfers Overall transfer level: Needs assistance Equipment used: Rolling walker (2 wheeled) Transfers: Sit to/from Stand Sit to Stand: Min guard         General transfer comment: cues for hand and R leg position  Ambulation/Gait Ambulation/Gait assistance: Min guard Ambulation Distance (Feet): 200 Feet Assistive device: Rolling walker (2 wheeled) Gait Pattern/deviations: Step-to pattern;Step-through pattern;Antalgic     General Gait Details: cues for position inside  RW.   Stairs            Wheelchair Mobility    Modified Rankin (Stroke Patients Only)       Balance                                    Cognition Arousal/Alertness: Awake/alert Behavior During Therapy: WFL for tasks assessed/performed Overall Cognitive Status: Within Functional Limits for tasks assessed                      Exercises Total Joint Exercises Ankle Circles/Pumps: AROM;Both;10 reps;Seated Quad Sets: AROM;Both;10 reps;Supine Short Arc Quad: AAROM;Right;10 reps;Seated Heel Slides: AAROM;Right;10 reps;Supine Straight Leg Raises: AAROM;Right;10 reps;Supine Long Arc Quad: AAROM;Right;10 reps;Seated Knee Flexion: AAROM;Right;10 reps;Seated Goniometric ROM: 10-60 R knee    General Comments        Pertinent  Vitals/Pain Pain Score: 3  Pain Descriptors / Indicators: Aching Pain Intervention(s): Premedicated before session;Ice applied    Home Living Family/patient expects to be discharged to:: Private residence Living Arrangements: Spouse/significant other Available Help at Discharge: Available 24 hours/day;Family Type of Home: House Home Access: Stairs to enter Entrance Stairs-Rails: None Home Layout: One level Home Equipment: Environmental consultant - 2 wheels;Bedside commode      Prior Function Level of Independence: Independent          PT Goals (current goals can now be found in the care plan section) Acute Rehab PT Goals 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 Goal Formulation: With patient Time For Goal Achievement: 09/28/14 Potential to Achieve Goals: Good Progress towards PT goals: Progressing toward goals    Frequency  7X/week    PT Plan Current plan remains appropriate    Co-evaluation             End of Session Equipment Utilized During Treatment:  (did not use) Activity Tolerance: Patient tolerated treatment well Patient left: in bed;with call bell/phone within reach;with family/visitor present     Time: 1359-1415 PT Time Calculation (min): 16 min  Charges:  $Gait Training: 8-22 mins                    G Codes:      Claretha Cooper  09/21/2014, 4:17 PM Tresa Endo PT [phone]

## 2014-09-22 LAB — CBC
HCT: 34.5 % — ABNORMAL LOW (ref 39.0–52.0)
Hemoglobin: 11.5 g/dL — ABNORMAL LOW (ref 13.0–17.0)
MCH: 30.8 pg (ref 26.0–34.0)
MCHC: 33.3 g/dL (ref 30.0–36.0)
MCV: 92.5 fL (ref 78.0–100.0)
Platelets: 140 10*3/uL — ABNORMAL LOW (ref 150–400)
RBC: 3.73 MIL/uL — ABNORMAL LOW (ref 4.22–5.81)
RDW: 13.8 % (ref 11.5–15.5)
WBC: 14.8 10*3/uL — ABNORMAL HIGH (ref 4.0–10.5)

## 2014-09-22 LAB — BASIC METABOLIC PANEL
Anion gap: 9 (ref 5–15)
BUN: 15 mg/dL (ref 6–23)
CO2: 28 mEq/L (ref 19–32)
Calcium: 8.3 mg/dL — ABNORMAL LOW (ref 8.4–10.5)
Chloride: 103 mEq/L (ref 96–112)
Creatinine, Ser: 0.79 mg/dL (ref 0.50–1.35)
GFR calc Af Amer: >90 mL/min (ref >90)
GFR calc non Af Amer: 88 mL/min — ABNORMAL LOW (ref >90)
Glucose, Bld: 119 mg/dL — ABNORMAL HIGH (ref 70–99)
Potassium: 4.3 mEq/L (ref 3.7–5.3)
Sodium: 140 mEq/L (ref 137–147)

## 2014-09-22 MED ORDER — RIVAROXABAN 10 MG PO TABS: 10.0000 mg | ORAL_TABLET | Freq: Every day | ORAL | Status: DC

## 2014-09-22 MED ORDER — TRAMADOL HCL 50 MG PO TABS: 50.0000 mg | ORAL_TABLET | Freq: Four times a day (QID) | ORAL | Status: DC | PRN

## 2014-09-22 MED ORDER — HYDROMORPHONE HCL 2 MG PO TABS: 2.0000 mg | ORAL_TABLET | ORAL | Status: DC | PRN

## 2014-09-22 MED ORDER — METHOCARBAMOL 500 MG PO TABS: 500.0000 mg | ORAL_TABLET | Freq: Four times a day (QID) | ORAL | Status: DC | PRN

## 2014-09-22 NOTE — Discharge Summary (Signed)
Physician Discharge Summary   Patient ID: Nicholas Lewis MRN: [id number] DOB/AGE: 1941/07/09 73 y.o.  Admit date: 09/20/2014 Discharge date: 09/22/2014  Primary Diagnosis:  Osteoarthritis Right knee(s)  Admission Diagnoses:  Past Medical History  Diagnosis Date  . Complication of anesthesia     slow to wake up  . Hypertension   . Arthritis   . Gout   . Abrasion     l arm  . Cancer     skin  . Cellulitis 07/2012    after surgery for left knee   Discharge Diagnoses:   Principal Problem:   OA (osteoarthritis) of knee  Estimated body mass index is 28.34 kg/(m^2) as calculated from the following:   Height as of this encounter: 6' (1.829 m).   Weight as of this encounter: 94.802 kg (209 lb).  Procedure:  Procedure(s) (LRB): RIGHT TOTAL KNEE ARTHROPLASTY (Right)   Consults: None  HPI: Nicholas Lewis is a 73 y.o. year old male with end stage OA of his right knee with progressively worsening pain and dysfunction. He has constant pain, with activity and at rest and significant functional deficits with difficulties even with ADLs. He has had extensive non-op management including analgesics, injections of cortisone and viscosupplements, and home exercise program, but remains in significant pain with significant dysfunction. Radiographs show bone on bone arthritis medial and patellofemoral. He presents now for right Total Knee Arthroplasty.   Laboratory Data: Admission on 09/20/2014, Discharged on 09/22/2014  Component Date Value Ref Range Status  . ABO/RH(D) 09/20/2014 A NEG   Final  . Antibody Screen 09/20/2014 NEG   Final  . Sample Expiration 09/20/2014 09/23/2014   Final  . WBC 09/21/2014 12.5* 4.0 - 10.5 K/uL Final  . RBC 09/21/2014 3.97* 4.22 - 5.81 MIL/uL Final  . Hemoglobin 09/21/2014 12.4* 13.0 - 17.0 g/dL Final  . HCT 09/21/2014 35.4* 39.0 - 52.0 % Final  . MCV 09/21/2014 89.2  78.0 - 100.0 fL Final  . MCH 09/21/2014 31.2  26.0 - 34.0 pg Final  . MCHC 09/21/2014  35.0  30.0 - 36.0 g/dL Final  . RDW 09/21/2014 13.4  11.5 - 15.5 % Final  . Platelets 09/21/2014 152  150 - 400 K/uL Final  . Sodium 09/21/2014 143  137 - 147 mEq/L Final  . Potassium 09/21/2014 3.9  3.7 - 5.3 mEq/L Final  . Chloride 09/21/2014 107  96 - 112 mEq/L Final  . CO2 09/21/2014 28  19 - 32 mEq/L Final  . Glucose, Bld 09/21/2014 156* 70 - 99 mg/dL Final  . BUN 09/21/2014 15  6 - 23 mg/dL Final  . Creatinine, Ser 09/21/2014 0.83  0.50 - 1.35 mg/dL Final  . Calcium 09/21/2014 8.5  8.4 - 10.5 mg/dL Final  . GFR calc non Af Amer 09/21/2014 86* >90 mL/min Final  . GFR calc Af Amer 09/21/2014 >90  >90 mL/min Final   Comment: (NOTE)                          The eGFR has been calculated using the CKD EPI equation.                          This calculation has not been validated in all clinical situations.                          eGFR's persistently <90 mL/min signify possible  Chronic Kidney                          Disease.  . Anion gap 09/21/2014 8  5 - 15 Final  . WBC 09/22/2014 14.8* 4.0 - 10.5 K/uL Final  . RBC 09/22/2014 3.73* 4.22 - 5.81 MIL/uL Final  . Hemoglobin 09/22/2014 11.5* 13.0 - 17.0 g/dL Final  . HCT 09/22/2014 34.5* 39.0 - 52.0 % Final  . MCV 09/22/2014 92.5  78.0 - 100.0 fL Final  . MCH 09/22/2014 30.8  26.0 - 34.0 pg Final  . MCHC 09/22/2014 33.3  30.0 - 36.0 g/dL Final  . RDW 09/22/2014 13.8  11.5 - 15.5 % Final  . Platelets 09/22/2014 140* 150 - 400 K/uL Final  . Sodium 09/22/2014 140  137 - 147 mEq/L Final  . Potassium 09/22/2014 4.3  3.7 - 5.3 mEq/L Final  . Chloride 09/22/2014 103  96 - 112 mEq/L Final  . CO2 09/22/2014 28  19 - 32 mEq/L Final  . Glucose, Bld 09/22/2014 119* 70 - 99 mg/dL Final  . BUN 09/22/2014 15  6 - 23 mg/dL Final  . Creatinine, Ser 09/22/2014 0.79  0.50 - 1.35 mg/dL Final  . Calcium 09/22/2014 8.3* 8.4 - 10.5 mg/dL Final  . GFR calc non Af Amer 09/22/2014 88* >90 mL/min Final  . GFR calc Af Amer 09/22/2014 >90  >90 mL/min Final    Comment: (NOTE)                          The eGFR has been calculated using the CKD EPI equation.                          This calculation has not been validated in all clinical situations.                          eGFR's persistently <90 mL/min signify possible Chronic Kidney                          Disease.  Georgiann Hahn gap 09/22/2014 9  5 - 15 Final  Hospital Outpatient Visit on 09/15/2014  Component Date Value Ref Range Status  . MRSA, PCR 09/15/2014 NEGATIVE  NEGATIVE Final  . Staphylococcus aureus 09/15/2014 NEGATIVE  NEGATIVE Final   Comment:                                 The Xpert SA Assay (FDA                          approved for NASAL specimens                          in patients over 81 years of age),                          is one component of                          a comprehensive surveillance  program.  Test performance has                          been validated by [facility] for patients greater                          than or equal to 12 year old.                          It is not intended                          to diagnose infection nor to                          guide or monitor treatment.  Marland Kitchen aPTT 09/15/2014 33  24 - 37 seconds Final  . WBC 09/15/2014 8.1  4.0 - 10.5 K/uL Final  . RBC 09/15/2014 4.56  4.22 - 5.81 MIL/uL Final  . Hemoglobin 09/15/2014 14.3  13.0 - 17.0 g/dL Final  . HCT 09/15/2014 41.2  39.0 - 52.0 % Final  . MCV 09/15/2014 90.4  78.0 - 100.0 fL Final  . MCH 09/15/2014 31.4  26.0 - 34.0 pg Final  . MCHC 09/15/2014 34.7  30.0 - 36.0 g/dL Final  . RDW 09/15/2014 13.5  11.5 - 15.5 % Final  . Platelets 09/15/2014 148* 150 - 400 K/uL Final  . Sodium 09/15/2014 141  137 - 147 mEq/L Final  . Potassium 09/15/2014 3.8  3.7 - 5.3 mEq/L Final  . Chloride 09/15/2014 102  96 - 112 mEq/L Final  . CO2 09/15/2014 30  19 - 32 mEq/L Final  . Glucose, Bld 09/15/2014 84  70 - 99 mg/dL Final  . BUN  09/15/2014 19  6 - 23 mg/dL Final  . Creatinine, Ser 09/15/2014 0.91  0.50 - 1.35 mg/dL Final  . Calcium 09/15/2014 9.6  8.4 - 10.5 mg/dL Final  . Total Protein 09/15/2014 6.9  6.0 - 8.3 g/dL Final  . Albumin 09/15/2014 3.7  3.5 - 5.2 g/dL Final  . AST 09/15/2014 26  0 - 37 U/L Final  . ALT 09/15/2014 16  0 - 53 U/L Final  . Alkaline Phosphatase 09/15/2014 89  39 - 117 U/L Final  . Total Bilirubin 09/15/2014 1.1  0.3 - 1.2 mg/dL Final  . GFR calc non Af Amer 09/15/2014 83* >90 mL/min Final  . GFR calc Af Amer 09/15/2014 >90  >90 mL/min Final   Comment: (NOTE)                          The eGFR has been calculated using the CKD EPI equation.                          This calculation has not been validated in all clinical situations.                          eGFR's persistently <90 mL/min signify possible Chronic Kidney  Disease.  . Anion gap 09/15/2014 9  5 - 15 Final  . Prothrombin Time 09/15/2014 14.2  11.6 - 15.2 seconds Final  . INR 09/15/2014 1.10  0.00 - 1.49 Final  . Color, Urine 09/15/2014 YELLOW  YELLOW Final  . APPearance 09/15/2014 CLEAR  CLEAR Final  . Specific Gravity, Urine 09/15/2014 1.021  1.005 - 1.030 Final  . pH 09/15/2014 7.5  5.0 - 8.0 Final  . Glucose, UA 09/15/2014 NEGATIVE  NEGATIVE mg/dL Final  . Hgb urine dipstick 09/15/2014 NEGATIVE  NEGATIVE Final  . Bilirubin Urine 09/15/2014 NEGATIVE  NEGATIVE Final  . Ketones, ur 09/15/2014 NEGATIVE  NEGATIVE mg/dL Final  . Protein, ur 09/15/2014 NEGATIVE  NEGATIVE mg/dL Final  . Urobilinogen, UA 09/15/2014 1.0  0.0 - 1.0 mg/dL Final  . Nitrite 09/15/2014 NEGATIVE  NEGATIVE Final  . Leukocytes, UA 09/15/2014 NEGATIVE  NEGATIVE Final   MICROSCOPIC NOT DONE ON URINES WITH NEGATIVE PROTEIN, BLOOD, LEUKOCYTES, NITRITE, OR GLUCOSE <1000 mg/dL.     X-Rays:Dg Chest 2 View  09/15/2014   CLINICAL DATA:  Preoperative chest x-ray prior to right knee replacements  EXAM: CHEST  2 VIEW  COMPARISON:  Prior  chest x-ray 01/25/2014  FINDINGS: The lungs are clear and negative for focal airspace consolidation, pulmonary edema or suspicious pulmonary nodule. No pleural effusion or pneumothorax. Cardiac and mediastinal contours are within normal limits. No acute fracture or lytic or blastic osseous lesions. Prominent multilevel vertebral body endplate spurring. The visualized upper abdominal bowel gas pattern is unremarkable.  IMPRESSION: No active cardiopulmonary disease.   Electronically Signed   By: Jacqulynn Cadet M.D.   On: 09/15/2014 17:36    EKG: Orders placed during the hospital encounter of 09/15/14  . EKG 12-LEAD  . EKG 12-LEAD     Hospital Course: Nicholas Lewis is a 73 y.o. who was admitted to [facility]. They were brought to the operating room on 09/20/2014 and underwent Procedure(s): RIGHT TOTAL KNEE ARTHROPLASTY.  Patient tolerated the procedure well and was later transferred to the recovery room and then to the orthopaedic floor for postoperative care.  They were given PO and IV analgesics for pain control following their surgery.  They were given 24 hours of postoperative antibiotics of  Anti-infectives   Start     Dose/Rate Route Frequency Ordered Stop   09/20/14 1800  ceFAZolin (ANCEF) IVPB 2 g/50 mL premix     2 g 100 mL/hr over 30 Minutes Intravenous Every 6 hours 09/20/14 1518 09/21/14 0032   09/20/14 1015  ceFAZolin (ANCEF) IVPB 2 g/50 mL premix     2 g 100 mL/hr over 30 Minutes Intravenous On call to O.R. 09/20/14 2863 09/20/14 1215     and started on DVT prophylaxis in the form of Xarelto.   PT and OT were ordered for total joint protocol.  Discharge planning consulted to help with postop disposition and equipment needs.  Patient had a tough night on the evening of surgery.  They started to get up OOB with therapy on day one. Patient was well, but had some minor complaints of pain in the knee, requiring pain medications. The IV pain meds caused him to have some sweats  that night and likely dropped his pressures. Low pressures noted in the chart. Encourage the PO med for better control.  Denied any CP/SOB/Nausea.  Hemovac drain was pulled without difficulty.  Continued to work with therapy into day two.  Dressing was changed on day two and the incision was  healing well. Patient was seen in rounds and felt better that second day. Then he was ready to go home.  Discharge home with home health  Diet - Regular diet  Follow up - in 2 weeks  Activity - WBAT  Disposition - Home  Condition Upon Discharge - Good  D/C Meds - See DC Summary  DVT Prophylaxis - Xarelto       Discharge Instructions   Call MD / Call 911    Complete by:  As directed   If you experience chest pain or shortness of breath, CALL 911 and be transported to the hospital emergency room.  If you develope a fever above 101 F, pus (white drainage) or increased drainage or redness at the wound, or calf pain, call your surgeon's office.     Change dressing    Complete by:  As directed   Change dressing daily with sterile 4 x 4 inch gauze dressing and apply TED hose. Do not submerge the incision under water.     Constipation Prevention    Complete by:  As directed   Drink plenty of fluids.  Prune juice may be helpful.  You may use a stool softener, such as Colace (over the counter) 100 mg twice a day.  Use MiraLax (over the counter) for constipation as needed.     Diet - low sodium heart healthy    Complete by:  As directed      Diet Carb Modified    Complete by:  As directed      Discharge instructions    Complete by:  As directed   Pick up stool softner and laxative for home. Do not submerge incision under water. May shower. Continue to use ice for pain and swelling from surgery. Take Xarelto for two and a half more weeks, then discontinue Xarelto.\Once the patient has completed the Xarelto, they may resume the 81 mg Aspirin.     Do not put a pillow under the knee. Place it under the heel.     Complete by:  As directed      Do not sit on low chairs, stoools or toilet seats, as it may be difficult to get up from low surfaces    Complete by:  As directed      Driving restrictions    Complete by:  As directed   No driving until released by the physician.     Increase activity slowly as tolerated    Complete by:  As directed      Lifting restrictions    Complete by:  As directed   No lifting until released by the physician.     Patient may shower    Complete by:  As directed   You may shower without a dressing once there is no drainage.  Do not wash over the wound.  If drainage remains, do not shower until drainage stops.     TED hose    Complete by:  As directed   Use stockings (TED hose) for 3 weeks on both leg(s).  You may remove them at night for sleeping.     Weight bearing as tolerated    Complete by:  As directed             Medication List    STOP taking these medications       aspirin EC 81 MG tablet     ibuprofen 200 MG tablet  Commonly known as:  ADVIL,MOTRIN  multivitamin with minerals Tabs tablet     saw palmetto 160 MG capsule      TAKE these medications       allopurinol 300 MG tablet  Commonly known as:  ZYLOPRIM  Take 300 mg by mouth every morning.     hydrochlorothiazide 25 MG tablet  Commonly known as:  HYDRODIURIL  Take 25 mg by mouth every morning.     HYDROmorphone 2 MG tablet  Commonly known as:  DILAUDID  Take 1-2 tablets (2-4 mg total) by mouth every 3 (three) hours as needed for severe pain.     methocarbamol 500 MG tablet  Commonly known as:  ROBAXIN  Take 1 tablet (500 mg total) by mouth every 6 (six) hours as needed for muscle spasms.     metoprolol 100 MG tablet  Commonly known as:  LOPRESSOR  Take 100 mg by mouth 2 (two) times daily.     rivaroxaban 10 MG Tabs tablet  Commonly known as:  XARELTO  - Take 1 tablet (10 mg total) by mouth daily with breakfast. Take Xarelto for two and a half more weeks, then  discontinue Xarelto.  - Once the patient has completed the Xarelto, they may resume the 81 mg Aspirin.     traMADol 50 MG tablet  Commonly known as:  ULTRAM  Take 1-2 tablets (50-100 mg total) by mouth every 6 (six) hours as needed (mild pain).       Follow-up Information   Follow up with Gearlean Alf, MD. Schedule an appointment as soon as possible for a visit in 2 weeks. (Call office )    Specialty:  Orthopedic Surgery   Contact information:   [address] [place] [address] [phone]       Follow up with [facility].   Contact information:   [address] [place] [address] [phone]       Signed: Arlee Muslim, PA-C Orthopaedic Surgery 09/29/2014, 2:17 PM

## 2014-09-22 NOTE — Progress Notes (Signed)
Physical Therapy Treatment Patient Details Name: Nicholas Lewis MRN: [id number] DOB: 10-05-1941 Today's Date: 09/22/2014    History of Present Illness Pt admitted for R TKA    PT Comments    POD # 2 am session.  Assisted OOB to amb in hallway then returned to room to perform TKR TE's following handout HEP.  Instructed on proper tech and use of ICE after. Pt ready for D/C to home.  Follow Up Recommendations  Home health PT     Equipment Recommendations  None recommended by PT    Recommendations for Other Services       Precautions / Restrictions Precautions Precautions: Fall;Knee Restrictions Weight Bearing Restrictions: No    Mobility  Bed Mobility Overal bed mobility: Modified Independent Bed Mobility: Supine to Sit;Sit to Supine     Supine to sit: Min guard Sit to supine: Min assist   General bed mobility comments: increased time  Transfers Overall transfer level: Needs assistance Equipment used: Rolling walker (2 wheeled) Transfers: Sit to/from Stand Sit to Stand: Supervision         General transfer comment: increased time  Ambulation/Gait Ambulation/Gait assistance: Supervision;Min guard Ambulation Distance (Feet): 225 Feet Assistive device: Rolling walker (2 wheeled) Gait Pattern/deviations: Step-to pattern;Step-through pattern Gait velocity: WFL   General Gait Details: cues for position inside  RW.   Stairs Stairs: Yes Stairs assistance: Supervision Stair Management: No rails;Step to pattern;Forwards;With walker Number of Stairs: 1 General stair comments: one initial VC on proper sequencing  Wheelchair Mobility    Modified Rankin (Stroke Patients Only)       Balance                                    Cognition Arousal/Alertness: Awake/alert Behavior During Therapy: WFL for tasks assessed/performed Overall Cognitive Status: Within Functional Limits for tasks assessed                      Exercises    Total Knee Replacement TE's 10 reps B LE ankle pumps 10 reps towel squeezes 10 reps knee presses 10 reps heel slides  10 reps SAQ's 10 reps SLR's 10 reps ABD Followed by ICE     General Comments        Pertinent Vitals/Pain Pain Assessment: 0-10 Pain Score: 2  Pain Location: R knee "a little" Pain Intervention(s): Monitored during session;Premedicated before session;Repositioned    Home Living                      Prior Function            PT Goals (current goals can now be found in the care plan section) Progress towards PT goals: Progressing toward goals    Frequency       PT Plan      Co-evaluation             End of Session Equipment Utilized During Treatment: Gait belt Activity Tolerance: Patient tolerated treatment well Patient left: in chair;with call bell/phone within reach;with family/visitor present     Time: 7408-1448 PT Time Calculation (min): 32 min  Charges:  $Gait Training: 8-22 mins $Therapeutic Exercise: 8-22 mins                    G Codes:      Rica Koyanagi  PTA [facility]  Acute  Rehab Pager  [phone]  

## 2014-09-22 NOTE — Progress Notes (Signed)
   Subjective: 2 Days Post-Op Procedure(s) (LRB): RIGHT TOTAL KNEE ARTHROPLASTY (Right) Patient reports pain as mild.   Patient seen in rounds with Dr. Wynelle Link. Patient is well, and has had no acute complaints or problems Patient is ready to go home  Objective: Vital signs in last 24 hours: Temp:  [98 F (36.7 C)-98.3 F (36.8 C)] 98.3 F (36.8 C) (09/23 0447) Pulse Rate:  [58-66] 58 (09/23 0447) Resp:  [16-18] 16 (09/23 0447) BP: (113-118)/(53-69) 118/53 mmHg (09/23 0447) SpO2:  [98 %] 98 % (09/23 0447)  Intake/Output from previous day:  Intake/Output Summary (Last 24 hours) at 09/22/14 0955 Last data filed at 09/22/14 0900  Gross per 24 hour  Intake 1907.67 ml  Output   1925 ml  Net -17.33 ml    Intake/Output this shift: Total I/O In: 360 [P.O.:360] Out: -   Labs:  Recent Labs  09/21/14 0431 09/22/14 0432  HGB 12.4* 11.5*    Recent Labs  09/21/14 0431 09/22/14 0432  WBC 12.5* 14.8*  RBC 3.97* 3.73*  HCT 35.4* 34.5*  PLT 152 140*    Recent Labs  09/21/14 0431 09/22/14 0432  NA 143 140  K 3.9 4.3  CL 107 103  CO2 28 28  BUN 15 15  CREATININE 0.83 0.79  GLUCOSE 156* 119*  CALCIUM 8.5 8.3*   No results found for this basename: LABPT, INR,  in the last 72 hours  EXAM: General - Patient is Alert and Appropriate Extremity - Neurovascular intact Sensation intact distally Incision - clean, dry, no drainage Motor Function - intact, moving foot and toes well on exam.   Assessment/Plan: 2 Days Post-Op Procedure(s) (LRB): RIGHT TOTAL KNEE ARTHROPLASTY (Right) Procedure(s) (LRB): RIGHT TOTAL KNEE ARTHROPLASTY (Right) Past Medical History  Diagnosis Date  . Complication of anesthesia     slow to wake up  . Hypertension   . Arthritis   . Gout   . Abrasion     l arm  . Cancer     skin  . Cellulitis 07/2012    after surgery for left knee   Principal Problem:   OA (osteoarthritis) of knee  Estimated body mass index is 28.34 kg/(m^2) as  calculated from the following:   Height as of this encounter: 6' (1.829 m).   Weight as of this encounter: 94.802 kg (209 lb). Discharge home with home health Diet - Regular diet Follow up - in 2 weeks Activity - WBAT Disposition - Home Condition Upon Discharge - Good D/C Meds - See DC Summary DVT Prophylaxis - [place], PA-C Orthopaedic Surgery 09/22/2014, 9:55 AM

## 2014-09-22 NOTE — Progress Notes (Signed)
Occupational Therapy Treatment Patient Details Name: Nicholas Lewis MRN: [id number] DOB: Jan 13, 1941 Today's Date: 09/22/2014    History of present illness Pt admitted for R TKA   OT comments  Pt doing well. Practiced shower transfer and discussed at length safety with showering including shower seat, HH shower option, sequence and safety with steadying walker by wife.    Follow Up Recommendations  No OT follow up    Equipment Recommendations  None recommended by OT    Recommendations for Other Services      Precautions / Restrictions Precautions Precautions: Fall;Knee Restrictions Weight Bearing Restrictions: No       Mobility Bed Mobility   Bed Mobility: Supine to Sit;Sit to Supine     Supine to sit: Min guard Sit to supine: Min assist      Transfers Overall transfer level: Needs assistance Equipment used: Rolling walker (2 wheeled) Transfers: Sit to/from Stand Sit to Stand: Min guard         General transfer comment: cues for hand and R leg position    Balance                                   ADL                                   Tub/ Shower Transfer: Min guard;Walk-in shower;Rolling walker     General ADL Comments: Practiced shower transfer with wife present. Emphasized need for wife to hold front of shower as pt steps in and out of shower to hold the walker steady. Issued shower transfer handout and reviewed several times after practicing including another demonstration. Discussed at length placement of shower seat for safety especially if pt not able to bring walker into shower. Pt's wife states she plans to check on getting a shower seat for him and a HH shower. Pt states he was up to 3in1 earlier and declines need to practice again. He verbalizes correct technique for 3in1 transfers and discussed how to elevate R LE while sitting on toilet if needed.       Vision                     Perception     Praxis       Cognition   Behavior During Therapy: WFL for tasks assessed/performed Overall Cognitive Status: Within Functional Limits for tasks assessed                       Extremity/Trunk Assessment               Exercises     Shoulder Instructions       General Comments      Pertinent Vitals/ Pain       Pain Location: pt didnt complain of pain during session  Home Living                                          Prior Functioning/Environment              Frequency Min 2X/week     Progress Toward Goals  OT Goals(current goals can now be found in the care plan section)  Progress towards OT goals:  Progressing toward goals     Plan Discharge plan remains appropriate    Co-evaluation                 End of Session Equipment Utilized During Treatment: Gait belt;Rolling walker   Activity Tolerance Patient tolerated treatment well   Patient Left in bed;with call bell/phone within reach;with family/visitor present   Nurse Communication          Time: 0263-7858 OT Time Calculation (min): 29 min  Charges: OT General Charges $OT Visit: 1 Procedure OT Treatments $Therapeutic Activity: 23-37 mins  Jules Schick [phone] 09/22/2014, 10:22 AM

## 2014-09-22 NOTE — Plan of Care (Signed)
Problem: Consults Goal: Diagnosis- Total Joint Replacement Outcome: Completed/Met Date Met:  09/22/14 Primary Total Knee RIGHT  Problem: Phase III Progression Outcomes Goal: Anticoagulant follow-up in place Outcome: Not Applicable Date Met:  83/66/29 Xarelto VTE, no f/u needed.

## 2014-09-23 DIAGNOSIS — Z471 Aftercare following joint replacement surgery: Secondary | ICD-10-CM | POA: Diagnosis not present

## 2014-09-23 DIAGNOSIS — Z96651 Presence of right artificial knee joint: Secondary | ICD-10-CM | POA: Diagnosis not present

## 2014-09-23 DIAGNOSIS — Z96652 Presence of left artificial knee joint: Secondary | ICD-10-CM | POA: Diagnosis not present

## 2014-09-23 DIAGNOSIS — I10 Essential (primary) hypertension: Secondary | ICD-10-CM | POA: Diagnosis not present

## 2014-09-23 DIAGNOSIS — M109 Gout, unspecified: Secondary | ICD-10-CM | POA: Diagnosis not present

## 2014-09-24 DIAGNOSIS — Z471 Aftercare following joint replacement surgery: Secondary | ICD-10-CM | POA: Diagnosis not present

## 2014-09-24 DIAGNOSIS — Z96652 Presence of left artificial knee joint: Secondary | ICD-10-CM | POA: Diagnosis not present

## 2014-09-24 DIAGNOSIS — M109 Gout, unspecified: Secondary | ICD-10-CM | POA: Diagnosis not present

## 2014-09-24 DIAGNOSIS — Z96651 Presence of right artificial knee joint: Secondary | ICD-10-CM | POA: Diagnosis not present

## 2014-09-24 DIAGNOSIS — I10 Essential (primary) hypertension: Secondary | ICD-10-CM | POA: Diagnosis not present

## 2014-09-27 DIAGNOSIS — M109 Gout, unspecified: Secondary | ICD-10-CM | POA: Diagnosis not present

## 2014-09-27 DIAGNOSIS — I10 Essential (primary) hypertension: Secondary | ICD-10-CM | POA: Diagnosis not present

## 2014-09-27 DIAGNOSIS — Z96652 Presence of left artificial knee joint: Secondary | ICD-10-CM | POA: Diagnosis not present

## 2014-09-27 DIAGNOSIS — Z96651 Presence of right artificial knee joint: Secondary | ICD-10-CM | POA: Diagnosis not present

## 2014-09-27 DIAGNOSIS — Z471 Aftercare following joint replacement surgery: Secondary | ICD-10-CM | POA: Diagnosis not present

## 2014-09-28 DIAGNOSIS — M109 Gout, unspecified: Secondary | ICD-10-CM | POA: Diagnosis not present

## 2014-09-28 DIAGNOSIS — Z96651 Presence of right artificial knee joint: Secondary | ICD-10-CM | POA: Diagnosis not present

## 2014-09-28 DIAGNOSIS — Z471 Aftercare following joint replacement surgery: Secondary | ICD-10-CM | POA: Diagnosis not present

## 2014-09-28 DIAGNOSIS — I10 Essential (primary) hypertension: Secondary | ICD-10-CM | POA: Diagnosis not present

## 2014-09-28 DIAGNOSIS — Z96652 Presence of left artificial knee joint: Secondary | ICD-10-CM | POA: Diagnosis not present

## 2014-09-29 DIAGNOSIS — Z471 Aftercare following joint replacement surgery: Secondary | ICD-10-CM | POA: Diagnosis not present

## 2014-09-29 DIAGNOSIS — M109 Gout, unspecified: Secondary | ICD-10-CM | POA: Diagnosis not present

## 2014-09-29 DIAGNOSIS — I10 Essential (primary) hypertension: Secondary | ICD-10-CM | POA: Diagnosis not present

## 2014-09-29 DIAGNOSIS — Z96651 Presence of right artificial knee joint: Secondary | ICD-10-CM | POA: Diagnosis not present

## 2014-09-29 DIAGNOSIS — Z96652 Presence of left artificial knee joint: Secondary | ICD-10-CM | POA: Diagnosis not present

## 2014-09-30 DIAGNOSIS — M109 Gout, unspecified: Secondary | ICD-10-CM | POA: Diagnosis not present

## 2014-09-30 DIAGNOSIS — Z471 Aftercare following joint replacement surgery: Secondary | ICD-10-CM | POA: Diagnosis not present

## 2014-09-30 DIAGNOSIS — I10 Essential (primary) hypertension: Secondary | ICD-10-CM | POA: Diagnosis not present

## 2014-09-30 DIAGNOSIS — Z96652 Presence of left artificial knee joint: Secondary | ICD-10-CM | POA: Diagnosis not present

## 2014-09-30 DIAGNOSIS — Z96651 Presence of right artificial knee joint: Secondary | ICD-10-CM | POA: Diagnosis not present

## 2014-10-01 DIAGNOSIS — M109 Gout, unspecified: Secondary | ICD-10-CM | POA: Diagnosis not present

## 2014-10-01 DIAGNOSIS — Z96651 Presence of right artificial knee joint: Secondary | ICD-10-CM | POA: Diagnosis not present

## 2014-10-01 DIAGNOSIS — Z471 Aftercare following joint replacement surgery: Secondary | ICD-10-CM | POA: Diagnosis not present

## 2014-10-01 DIAGNOSIS — I10 Essential (primary) hypertension: Secondary | ICD-10-CM | POA: Diagnosis not present

## 2014-10-01 DIAGNOSIS — Z96652 Presence of left artificial knee joint: Secondary | ICD-10-CM | POA: Diagnosis not present

## 2014-10-04 ENCOUNTER — Emergency Department (HOSPITAL_COMMUNITY): Payer: Medicare Other

## 2014-10-04 ENCOUNTER — Encounter (HOSPITAL_COMMUNITY): Payer: Self-pay | Admitting: Emergency Medicine

## 2014-10-04 ENCOUNTER — Emergency Department (HOSPITAL_COMMUNITY)
Admission: EM | Admit: 2014-10-04 | Discharge: 2014-10-05 | Disposition: A | Discharge status: Home / Self Care | Payer: Medicare Other | Attending: Emergency Medicine | Admitting: Emergency Medicine

## 2014-10-04 DIAGNOSIS — Z7901 Long term (current) use of anticoagulants: Secondary | ICD-10-CM | POA: Diagnosis not present

## 2014-10-04 DIAGNOSIS — R079 Chest pain, unspecified: Secondary | ICD-10-CM | POA: Insufficient documentation

## 2014-10-04 DIAGNOSIS — Z96651 Presence of right artificial knee joint: Secondary | ICD-10-CM | POA: Diagnosis not present

## 2014-10-04 DIAGNOSIS — Z96652 Presence of left artificial knee joint: Secondary | ICD-10-CM | POA: Diagnosis not present

## 2014-10-04 DIAGNOSIS — Z79899 Other long term (current) drug therapy: Secondary | ICD-10-CM | POA: Diagnosis not present

## 2014-10-04 DIAGNOSIS — I10 Essential (primary) hypertension: Secondary | ICD-10-CM | POA: Diagnosis not present

## 2014-10-04 DIAGNOSIS — J9811 Atelectasis: Secondary | ICD-10-CM | POA: Diagnosis not present

## 2014-10-04 DIAGNOSIS — M109 Gout, unspecified: Secondary | ICD-10-CM | POA: Insufficient documentation

## 2014-10-04 DIAGNOSIS — Z872 Personal history of diseases of the skin and subcutaneous tissue: Secondary | ICD-10-CM | POA: Diagnosis not present

## 2014-10-04 DIAGNOSIS — Z85828 Personal history of other malignant neoplasm of skin: Secondary | ICD-10-CM | POA: Insufficient documentation

## 2014-10-04 DIAGNOSIS — Z471 Aftercare following joint replacement surgery: Secondary | ICD-10-CM | POA: Diagnosis not present

## 2014-10-04 DIAGNOSIS — R071 Chest pain on breathing: Secondary | ICD-10-CM | POA: Diagnosis not present

## 2014-10-04 LAB — COMPREHENSIVE METABOLIC PANEL
ALT: 15 U/L (ref 0–53)
AST: 25 U/L (ref 0–37)
Albumin: 3.2 g/dL — ABNORMAL LOW (ref 3.5–5.2)
Alkaline Phosphatase: 137 U/L — ABNORMAL HIGH (ref 39–117)
Anion gap: 14 (ref 5–15)
BUN: 23 mg/dL (ref 6–23)
CO2: 27 mEq/L (ref 19–32)
Calcium: 9.6 mg/dL (ref 8.4–10.5)
Chloride: 100 mEq/L (ref 96–112)
Creatinine, Ser: 1.13 mg/dL (ref 0.50–1.35)
GFR calc Af Amer: 73 mL/min — ABNORMAL LOW (ref >90)
GFR calc non Af Amer: 63 mL/min — ABNORMAL LOW (ref >90)
Glucose, Bld: 147 mg/dL — ABNORMAL HIGH (ref 70–99)
Potassium: 3.7 mEq/L (ref 3.7–5.3)
Sodium: 141 mEq/L (ref 137–147)
Total Bilirubin: 0.7 mg/dL (ref 0.3–1.2)
Total Protein: 7.3 g/dL (ref 6.0–8.3)

## 2014-10-04 LAB — CBC WITH DIFFERENTIAL/PLATELET
Basophils Absolute: 0 10*3/uL (ref 0.0–0.1)
Basophils Relative: 0 % (ref 0–1)
Eosinophils Absolute: 0.2 10*3/uL (ref 0.0–0.7)
Eosinophils Relative: 2 % (ref 0–5)
HCT: 33.7 % — ABNORMAL LOW (ref 39.0–52.0)
Hemoglobin: 11.5 g/dL — ABNORMAL LOW (ref 13.0–17.0)
Lymphocytes Relative: 18 % (ref 12–46)
Lymphs Abs: 1.9 10*3/uL (ref 0.7–4.0)
MCH: 30.7 pg (ref 26.0–34.0)
MCHC: 34.1 g/dL (ref 30.0–36.0)
MCV: 89.9 fL (ref 78.0–100.0)
Monocytes Absolute: 0.8 10*3/uL (ref 0.1–1.0)
Monocytes Relative: 8 % (ref 3–12)
Neutro Abs: 7.6 10*3/uL (ref 1.7–7.7)
Neutrophils Relative %: 72 % (ref 43–77)
Platelets: 421 10*3/uL — ABNORMAL HIGH (ref 150–400)
RBC: 3.75 MIL/uL — ABNORMAL LOW (ref 4.22–5.81)
RDW: 13.5 % (ref 11.5–15.5)
WBC: 10.5 10*3/uL (ref 4.0–10.5)

## 2014-10-04 LAB — D-DIMER, QUANTITATIVE (NOT AT ARMC): D-Dimer, Quant: 7.97 ug/mL-FEU — ABNORMAL HIGH (ref 0.00–0.48)

## 2014-10-04 LAB — TROPONIN I: Troponin I: <0.3 ng/mL (ref <0.30)

## 2014-10-04 MED ORDER — IOHEXOL 350 MG/ML SOLN
100.0000 mL | Freq: Once | INTRAVENOUS | Status: AC | PRN
  Administered 2014-10-04: 100 mL via INTRAVENOUS

## 2014-10-04 MED ORDER — MORPHINE SULFATE 4 MG/ML IJ SOLN
4.0000 mg | Freq: Once | INTRAMUSCULAR | Status: AC
  Administered 2014-10-04: 4 mg via INTRAVENOUS
  Filled 2014-10-04: qty 1

## 2014-10-04 MED ORDER — GI COCKTAIL ~~LOC~~
30.0000 mL | Freq: Once | ORAL | Status: AC
  Administered 2014-10-04: 30 mL via ORAL
  Filled 2014-10-04: qty 30

## 2014-10-04 NOTE — ED Notes (Signed)
Pt reporting pain in mid chest has returned.

## 2014-10-04 NOTE — ED Notes (Signed)
Chest pain , sob,  No NvD, Had knee replacement.Rt  9/21.

## 2014-10-04 NOTE — ED Provider Notes (Signed)
CSN: [id number]     Arrival date & time 10/04/14  2042 History  This chart was scribed for Hoy Morn, MD by Tula Nakayama, ED Scribe. This patient was seen in room APA09/APA09 and the patient's care was started at 9:09 PM.   Chief Complaint  Patient presents with  . Chest Pain   The history is provided by the patient. No language interpreter was used.   HPI Comments: EDMAN Lewis is a 73 y.o. male who presents to the Emergency Department complaining of non-radiating, pressure-like CP in the center of his ribs that started 3 hours ago. Pt states needing to take deep breaths to breath, but that deep breaths do not relieve symptoms. Pt denies nausea and vomiting as associated symptoms.   Pt had knee surgery two weeks ago and pt denies any problem with knee. Pt denies a  history of similar symptoms.He has no history of CAD, stents, or CABG. Pt did have a stress test years ago. Pt states that deep breaths do not change the pain. Pt takes Xarelto, Tramadol, and Robaxin. Pt has lost 34 lbs prior to surgery.    Past Medical History  Diagnosis Date  . Complication of anesthesia     slow to wake up  . Hypertension   . Arthritis   . Gout   . Abrasion     l arm  . Cellulitis 07/2012    after surgery for left knee  . Cancer     skin   Past Surgical History  Procedure Laterality Date  . Knee arthroscopy  2000    left  . Cystectomy  2003    from spine  . Total knee arthroplasty  08/06/2012    Procedure: TOTAL KNEE ARTHROPLASTY;  Surgeon: Magnus Sinning, MD;  Location: [facility];  Service: Orthopedics;  Laterality: Left;  . Total knee arthroplasty Right 09/20/2014    Procedure: RIGHT TOTAL KNEE ARTHROPLASTY;  Surgeon: Gearlean Alf, MD;  Location: [facility];  Service: Orthopedics;  Laterality: Right;  . Joint replacement    . Back surgery     History reviewed. No pertinent family history. History  Substance Use Topics  . Smoking status: Never Smoker   . Smokeless tobacco: Not on  file  . Alcohol Use: No    Review of Systems  10 Systems reviewed and all are negative for acute change except as noted in the HPI.  Allergies  Percocet; Sulfa antibiotics; and Mobic  Home Medications   Prior to Admission medications   Medication Sig Start Date End Date Taking? Authorizing Provider  allopurinol (ZYLOPRIM) 300 MG tablet Take 300 mg by mouth every morning.    Historical Provider, MD  hydrochlorothiazide (HYDRODIURIL) 25 MG tablet Take 25 mg by mouth every morning.    Historical Provider, MD  HYDROmorphone (DILAUDID) 2 MG tablet Take 1-2 tablets (2-4 mg total) by mouth every 3 (three) hours as needed for severe pain. 09/22/14   Arlee Muslim, PA-C  methocarbamol (ROBAXIN) 500 MG tablet Take 1 tablet (500 mg total) by mouth every 6 (six) hours as needed for muscle spasms. 09/22/14   Arlee Muslim, PA-C  metoprolol (LOPRESSOR) 100 MG tablet Take 100 mg by mouth 2 (two) times daily.    Historical Provider, MD  rivaroxaban (XARELTO) 10 MG TABS tablet Take 1 tablet (10 mg total) by mouth daily with breakfast. Take Xarelto for two and a half more weeks, then discontinue Xarelto. Once the patient has completed the Xarelto, they may resume  the 81 mg Aspirin. 09/22/14   Arlee Muslim, PA-C  traMADol (ULTRAM) 50 MG tablet Take 1-2 tablets (50-100 mg total) by mouth every 6 (six) hours as needed (mild pain). 09/22/14   Arlee Muslim, PA-C   There were no vitals taken for this visit. Physical Exam  Nursing note and vitals reviewed. Constitutional: He is oriented to person, place, and time. He appears well-developed and well-nourished.  HENT:  Head: Normocephalic and atraumatic.  Eyes: EOM are normal.  Neck: Normal range of motion.  Cardiovascular: Normal rate, regular rhythm, normal heart sounds and intact distal pulses.   Pulmonary/Chest: Effort normal and breath sounds normal. No respiratory distress.  Abdominal: Soft. He exhibits no distension. There is no tenderness.   Musculoskeletal: Normal range of motion.  No significant swelling of the right leg as compared to the left.  No erythema around the right knee.  Neurological: He is alert and oriented to person, place, and time.  Skin: Skin is warm and dry.  Psychiatric: He has a normal mood and affect. Judgment normal.    ED Course  Procedures (including critical care time)  DIAGNOSTIC STUDIES: Oxygen Saturation is 100% on RA, normal by my interpretation.    COORDINATION OF CARE: 9:13 PM Discussed treatment plan with pt at bedside and pt agreed to plan.   Labs Review Labs Reviewed  COMPREHENSIVE METABOLIC PANEL - Abnormal; Notable for the following:    Glucose, Bld 147 (*)    Albumin 3.2 (*)    Alkaline Phosphatase 137 (*)    GFR calc non Af Amer 63 (*)    GFR calc Af Amer 73 (*)    All other components within normal limits  CBC WITH DIFFERENTIAL - Abnormal; Notable for the following:    RBC 3.75 (*)    Hemoglobin 11.5 (*)    HCT 33.7 (*)    Platelets 421 (*)    All other components within normal limits  D-DIMER, QUANTITATIVE - Abnormal; Notable for the following:    D-Dimer, Quant 7.97 (*)    All other components within normal limits  TROPONIN I  TROPONIN I    Imaging Review Dg Chest 2 View  10/04/2014   CLINICAL DATA:  Right knee surgery 2 weeks ago. Chest pain today when he eating. Worse than typical indigestion. Initial encounter.  EXAM: CHEST  2 VIEW  COMPARISON:  Radiographs 09/15/2014 and 01/25/2014.  FINDINGS: The heart size and mediastinal contours are normal. The lungs are clear. There is no pleural effusion or pneumothorax. No acute osseous findings are identified. Lower paraspinal osteophytes are noted.  IMPRESSION: No active cardiopulmonary process.   Electronically Signed   By: Camie Patience M.D.   On: 10/04/2014 22:36   Ct Angio Chest W/cm &/or Wo Cm  10/05/2014   CLINICAL DATA:  Acute onset of nonradiating pressure-like chest pain at the center of the chest, starting 3  hours ago. Recent knee surgery, 2 weeks ago. Initial encounter.  EXAM: CT ANGIOGRAPHY CHEST WITH CONTRAST  TECHNIQUE: Multidetector CT imaging of the chest was performed using the standard protocol during bolus administration of intravenous contrast. Multiplanar CT image reconstructions and MIPs were obtained to evaluate the vascular anatomy.  CONTRAST:  168mL OMNIPAQUE IOHEXOL 350 MG/ML SOLN  COMPARISON:  Chest radiograph performed earlier today at 10:02 p.m.  FINDINGS: There is no evidence of pulmonary embolus.  Minimal bibasilar atelectasis is noted. The lungs are otherwise clear. There is no evidence of significant focal consolidation, pleural effusion or pneumothorax. No masses  are identified; no abnormal focal contrast enhancement is seen.  The mediastinum is unremarkable in appearance. No mediastinal adenopathy is seen. No pericardial effusion is identified. The great vessel grossly unremarkable appearance. Note is made of a mildly complex 3.2 cm mass at the left thyroid lobe, with scattered calcification. No axillary lymphadenopathy is seen.  The visualized portions of the liver and spleen are unremarkable.  No acute osseous abnormalities are seen.  Review of the MIP images confirms the above findings.  IMPRESSION: 1. No evidence of pulmonary embolus. 2. Minimal bibasilar atelectasis; lungs otherwise clear. 3. Mildly complex 3.2 cm mass at the left thyroid lobe, with scattered calcification. Recommend further evaluation with thyroid ultrasound. If patient is clinically hyperthyroid, consider nuclear medicine thyroid uptake and scan.   Electronically Signed   By: Garald Balding M.D.   On: 10/05/2014 00:40  I personally reviewed the imaging tests through PACS system I reviewed available ER/hospitalization records through the EMR    EKG Interpretation   Date/Time:  Monday October 04 2014 20:58:02 EDT Ventricular Rate:  56 PR Interval:  184 QRS Duration: 98 QT Interval:  420 QTC Calculation: 405 R  Axis:   2 Text Interpretation:  Sinus bradycardia Otherwise normal ECG No  significant change was found Confirmed by Tahlia Deamer  MD, Lennette Bihari ([id number]) on  10/04/2014 9:12:39 PM      ECG interpretation  Date: 10/05/2014  Rate: 56  Rhythm: normal sinus rhythm  QRS Axis: normal  Intervals: normal  ST/T Wave abnormalities: normal  Conduction Disutrbances: none  Narrative Interpretation:   Old EKG Reviewed: No significant changes noted      MDM   Final diagnoses:  Chest pain, unspecified chest pain type   Patient presents with chest pain and a feeling of shortness of breath.  He felt as I need to catch his breath.  EKG x2 is negative.  Troponin x2 is negative.  Elevated d-dimer but CT angiogram is normal.  Patient feels better at this time.  Discharge home in good condition.  Low suspicion for ACS.  Resolution of symptoms.  Patient be placed on a PPI.  Outpatient PCP followup.  He understands to return to the ER for new or worsening symptoms   I personally performed the services described in this documentation, which was scribed in my presence. The recorded information has been reviewed and is accurate.        Hoy Morn, MD 10/05/14 [phone]

## 2014-10-05 DIAGNOSIS — R079 Chest pain, unspecified: Secondary | ICD-10-CM | POA: Diagnosis not present

## 2014-10-05 LAB — TROPONIN I: Troponin I: <0.3 ng/mL (ref <0.30)

## 2014-10-05 MED ORDER — OMEPRAZOLE 20 MG PO CPDR: 20.0000 mg | DELAYED_RELEASE_CAPSULE | Freq: Two times a day (BID) | ORAL | Status: DC

## 2014-10-05 NOTE — Discharge Instructions (Signed)

## 2014-10-05 NOTE — ED Notes (Signed)
Pt reporting decrease in pain at this time.

## 2014-10-06 DIAGNOSIS — I10 Essential (primary) hypertension: Secondary | ICD-10-CM | POA: Diagnosis not present

## 2014-10-06 DIAGNOSIS — Z471 Aftercare following joint replacement surgery: Secondary | ICD-10-CM | POA: Diagnosis not present

## 2014-10-06 DIAGNOSIS — Z96651 Presence of right artificial knee joint: Secondary | ICD-10-CM | POA: Diagnosis not present

## 2014-10-06 DIAGNOSIS — Z96652 Presence of left artificial knee joint: Secondary | ICD-10-CM | POA: Diagnosis not present

## 2014-10-06 DIAGNOSIS — M109 Gout, unspecified: Secondary | ICD-10-CM | POA: Diagnosis not present

## 2014-10-07 DIAGNOSIS — E663 Overweight: Secondary | ICD-10-CM | POA: Diagnosis not present

## 2014-10-07 DIAGNOSIS — Z6829 Body mass index (BMI) 29.0-29.9, adult: Secondary | ICD-10-CM | POA: Diagnosis not present

## 2014-10-07 DIAGNOSIS — Z96652 Presence of left artificial knee joint: Secondary | ICD-10-CM | POA: Diagnosis not present

## 2014-10-07 DIAGNOSIS — Z96651 Presence of right artificial knee joint: Secondary | ICD-10-CM | POA: Diagnosis not present

## 2014-10-07 DIAGNOSIS — M109 Gout, unspecified: Secondary | ICD-10-CM | POA: Diagnosis not present

## 2014-10-07 DIAGNOSIS — Z471 Aftercare following joint replacement surgery: Secondary | ICD-10-CM | POA: Diagnosis not present

## 2014-10-07 DIAGNOSIS — E041 Nontoxic single thyroid nodule: Secondary | ICD-10-CM | POA: Diagnosis not present

## 2014-10-07 DIAGNOSIS — I10 Essential (primary) hypertension: Secondary | ICD-10-CM | POA: Diagnosis not present

## 2014-10-12 DIAGNOSIS — Z96651 Presence of right artificial knee joint: Secondary | ICD-10-CM | POA: Diagnosis not present

## 2014-10-12 DIAGNOSIS — Z96652 Presence of left artificial knee joint: Secondary | ICD-10-CM | POA: Diagnosis not present

## 2014-10-12 DIAGNOSIS — I10 Essential (primary) hypertension: Secondary | ICD-10-CM | POA: Diagnosis not present

## 2014-10-12 DIAGNOSIS — Z471 Aftercare following joint replacement surgery: Secondary | ICD-10-CM | POA: Diagnosis not present

## 2014-10-12 DIAGNOSIS — M109 Gout, unspecified: Secondary | ICD-10-CM | POA: Diagnosis not present

## 2014-10-13 ENCOUNTER — Other Ambulatory Visit (HOSPITAL_COMMUNITY): Payer: Self-pay | Admitting: Family Medicine

## 2014-10-13 DIAGNOSIS — E041 Nontoxic single thyroid nodule: Secondary | ICD-10-CM

## 2014-10-14 ENCOUNTER — Ambulatory Visit (HOSPITAL_COMMUNITY): Payer: Medicare Other

## 2014-10-14 DIAGNOSIS — Z471 Aftercare following joint replacement surgery: Secondary | ICD-10-CM | POA: Diagnosis not present

## 2014-10-14 DIAGNOSIS — Z96652 Presence of left artificial knee joint: Secondary | ICD-10-CM | POA: Diagnosis not present

## 2014-10-14 DIAGNOSIS — I10 Essential (primary) hypertension: Secondary | ICD-10-CM | POA: Diagnosis not present

## 2014-10-14 DIAGNOSIS — Z96651 Presence of right artificial knee joint: Secondary | ICD-10-CM | POA: Diagnosis not present

## 2014-10-14 DIAGNOSIS — M109 Gout, unspecified: Secondary | ICD-10-CM | POA: Diagnosis not present

## 2014-10-15 ENCOUNTER — Ambulatory Visit (HOSPITAL_COMMUNITY)
Admission: RE | Admit: 2014-10-15 | Discharge: 2014-10-15 | Disposition: A | Discharge status: Home / Self Care | Payer: Medicare Other | Source: Ambulatory Visit | Attending: Orthopedic Surgery | Admitting: Orthopedic Surgery

## 2014-10-15 ENCOUNTER — Ambulatory Visit (HOSPITAL_COMMUNITY)
Admission: RE | Admit: 2014-10-15 | Discharge: 2014-10-15 | Disposition: A | Discharge status: Home / Self Care | Payer: Medicare Other | Source: Ambulatory Visit | Attending: Family Medicine | Admitting: Family Medicine

## 2014-10-15 DIAGNOSIS — R29898 Other symptoms and signs involving the musculoskeletal system: Secondary | ICD-10-CM | POA: Insufficient documentation

## 2014-10-15 DIAGNOSIS — R262 Difficulty in walking, not elsewhere classified: Secondary | ICD-10-CM | POA: Diagnosis not present

## 2014-10-15 DIAGNOSIS — E041 Nontoxic single thyroid nodule: Secondary | ICD-10-CM | POA: Diagnosis not present

## 2014-10-15 DIAGNOSIS — Z96651 Presence of right artificial knee joint: Secondary | ICD-10-CM | POA: Diagnosis not present

## 2014-10-15 DIAGNOSIS — M25661 Stiffness of right knee, not elsewhere classified: Secondary | ICD-10-CM | POA: Diagnosis not present

## 2014-10-15 DIAGNOSIS — Z471 Aftercare following joint replacement surgery: Secondary | ICD-10-CM | POA: Insufficient documentation

## 2014-10-15 DIAGNOSIS — M25561 Pain in right knee: Secondary | ICD-10-CM | POA: Insufficient documentation

## 2014-10-15 DIAGNOSIS — M6281 Muscle weakness (generalized): Secondary | ICD-10-CM | POA: Diagnosis not present

## 2014-10-15 DIAGNOSIS — M25669 Stiffness of unspecified knee, not elsewhere classified: Secondary | ICD-10-CM | POA: Insufficient documentation

## 2014-10-15 NOTE — Evaluation (Signed)
Physical Therapy Evaluation  Patient Details  Name: GEORGIO HATTABAUGH MRN: [id number] Date of Birth: 09-27-1941  Today's Date: 10/15/2014 Time: 1320-1408 PT Time Calculation (min): 48 min Charge: evaluaiton [id number]; there ex 2979-8921             Visit#: 1 of 12  Re-eval: 11/14/14 Assessment Diagnosis: Rt TKR Surgical Date: 09/20/14 Next MD Visit: 10/26/2014 Prior Therapy: HH  Authorization: medicare    Past Medical History:  Past Medical History  Diagnosis Date  . Complication of anesthesia     slow to wake up  . Hypertension   . Arthritis   . Gout   . Abrasion     l arm  . Cellulitis 07/2012    after surgery for left knee  . Cancer     skin   Past Surgical History:  Past Surgical History  Procedure Laterality Date  . Knee arthroscopy  2000    left  . Cystectomy  2003    from spine  . Total knee arthroplasty  08/06/2012    Procedure: TOTAL KNEE ARTHROPLASTY;  Surgeon: Magnus Sinning, MD;  Location: [facility];  Service: Orthopedics;  Laterality: Left;  . Total knee arthroplasty Right 09/20/2014    Procedure: RIGHT TOTAL KNEE ARTHROPLASTY;  Surgeon: Gearlean Alf, MD;  Location: [facility];  Service: Orthopedics;  Laterality: Right;  . Joint replacement    . Back surgery      Subjective Symptoms/Limitations Symptoms: Pt states that he had his TKR on his Rt LE on 09/20/2014 he was discharged to [facility] on 09/22/2014.  He is now being referred to OP therapy to maximize his functional ability.  At this point Mr. Thackston states that he is still having difficuty with his stairs.  He is walking without his cane inside but outside with his cane.  How long can you sit comfortably?: sit for 10 minutes and then he needs to move.  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,     Sensation/Coordination/Flexibility/Functional Tests Functional Tests Functional Tests: foto 45  Assessment RLE AROM (degrees) Right Knee Extension: 13 Right Knee Flexion: 108 RLE Strength Right Hip Flexion: 5/5 Right Hip ABduction: 5/5 Right Knee Flexion: 3+/5 Right Knee Extension: 5/5 Right Ankle Dorsiflexion: 5/5  Exercise/Treatments Mobility/Balance  Static Standing Balance Single Leg Stance - Right Leg: 10 Single Leg Stance - Left Leg: 21   Stretches Active Hamstring Stretch: 3 reps;30 seconds Aerobic Stationary Bike: L 4 x 8:00 seat at 13 Standing SLS: 3x Supine Quad Sets: 5 reps Heel Slides: 5 reps   Prone  Hamstring Curl: 10 reps;Limitations Hamstring Curl Limitations: 4# Hip Extension: 10 reps    Physical Therapy Assessment and Plan PT Assessment and Plan Clinical Impression Statement: Mr. Yoshino is a 73 yo male with recent TKR on his Rt side.  He has been referred to PT to return him to his maximal functional level.  Examination demonstrates decreased ROM, decreased strength, decreased balance.  Mr. Elza will benefit from skilled PT to address these issues and improve his quality of life.  Pt will benefit from skilled therapeutic intervention in  order to improve on the following deficits: Pain;Decreased balance;Decreased range of motion;Difficulty walking;Decreased strength Rehab Potential: Good PT Frequency: Min 2X/week PT Duration: 4 weeks PT Treatment/Interventions: Gait training;Functional mobility training;Therapeutic activities;Therapeutic exercise;Manual techniques;Patient/family education PT Plan: Pt to begin heelraises, functional squats, lateral/forward step ups, rockerboard, supine terminal knee raise continue prone exercises in  department as well.  Pt may benefit from manual techniques including PROM     Goals Home Exercise Program Pt/caregiver will Perform Home Exercise Program: For increased ROM PT Short Term Goals PT Short Term Goal 1: Pt to be able to sit for 30 minutes to enjoy eating out and for more comfort with traveling PT Short Term Goal 2: Pt to be able to walk for 30 minutes without stopping  PT Long Term Goals Time to Complete Long Term Goals: 4 weeks PT Long Term Goal 1: Pain level to be at a 2/10 or less 80% of the day PT Long Term Goal 2: strength improved one grade to allow pt to go up and down steps in a reciprocal manner Long Term Goal 3: Pt ROM to be -5 to 120 to allow normalized walking and squatting  Long Term Goal 4: 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 Stiffness of joint, lower leg 10/15/2014  . Difficulty walking 10/15/2014  . Weakness of right leg 10/15/2014  . OA (osteoarthritis) of knee 09/20/2014  . Knee stiffness 09/09/2012  . S/P left TKA 08/09/2012    PT Plan of Care PT Home Exercise Plan: given  GP Functional Assessment Tool Used: foto Functional Limitation: Mobility: Walking and moving around Mobility: Walking and Moving Around Current Status ([id number]): At least 40 percent but less than 60 percent impaired, limited or restricted Mobility: Walking and Moving Around Goal Status [phone]): At least 20 percent but less than 40 percent impaired, limited or restricted  Hollee Fate,CINDY 10/15/2014, 2:14 PM  Physician Documentation Your signature is required to indicate approval of the treatment plan as stated above.  Please sign and either send electronically or make a copy of this report for your files and return this physician signed original.   Please mark one  1.__approve of plan  2. ___approve of plan with the following conditions.   ______________________________                                                          _____________________ Physician Signature                                                                                                             Date

## 2014-10-19 ENCOUNTER — Ambulatory Visit (HOSPITAL_COMMUNITY): Payer: Medicare Other | Admitting: Physical Therapy

## 2014-10-20 ENCOUNTER — Ambulatory Visit (HOSPITAL_COMMUNITY): Payer: Medicare Other

## 2014-10-20 ENCOUNTER — Ambulatory Visit (HOSPITAL_COMMUNITY)
Admission: RE | Admit: 2014-10-20 | Discharge: 2014-10-20 | Disposition: A | Discharge status: Home / Self Care | Payer: Medicare Other | Source: Ambulatory Visit | Attending: Family Medicine | Admitting: Family Medicine

## 2014-10-20 DIAGNOSIS — R262 Difficulty in walking, not elsewhere classified: Secondary | ICD-10-CM | POA: Diagnosis not present

## 2014-10-20 DIAGNOSIS — M6281 Muscle weakness (generalized): Secondary | ICD-10-CM | POA: Diagnosis not present

## 2014-10-20 DIAGNOSIS — M25661 Stiffness of right knee, not elsewhere classified: Secondary | ICD-10-CM | POA: Diagnosis not present

## 2014-10-20 DIAGNOSIS — R29898 Other symptoms and signs involving the musculoskeletal system: Secondary | ICD-10-CM

## 2014-10-20 DIAGNOSIS — M25561 Pain in right knee: Secondary | ICD-10-CM | POA: Diagnosis not present

## 2014-10-20 DIAGNOSIS — Z471 Aftercare following joint replacement surgery: Secondary | ICD-10-CM | POA: Diagnosis not present

## 2014-10-20 DIAGNOSIS — Z96651 Presence of right artificial knee joint: Secondary | ICD-10-CM | POA: Diagnosis not present

## 2014-10-20 NOTE — Progress Notes (Signed)
Physical Therapy Treatment Patient Details  Name: Nicholas Lewis MRN: [id number] Date of Birth: Dec 12, 1941  Today's Date: 10/20/2014 Time: 5784-6962 PT Time Calculation (min): 58 min Charge: TE 9528-4132, Ice 1235-1245  Visit#: 2 of 12  Re-eval: 11/14/14 Assessment Diagnosis: Rt TKR Surgical Date: 09/20/14 Next MD Visit: Maureen Ralphs 10/26/2014 Prior Therapy: HH  Authorization: medicare  Authorization Time Period:    Authorization Visit#:   of     Subjective: Symptoms/Limitations Symptoms: Rt knee is feeling good, no reports of pain today.  Pt compliant with HEP 1x daily and has been going on walks daily for .4 miile. Pain Assessment Currently in Pain?: No/denies  Objective:   Exercise/Treatments Stretches Active Hamstring Stretch: 3 reps;30 seconds Quad Stretch: 3 reps;30 seconds;Limitations Quad Stretch Limitations: prone with rope Gastroc Stretch: 3 reps;30 seconds;Limitations Gastroc Stretch Limitations: slant board Aerobic Stationary Bike: 8' seat 13 Standing Heel Raises: 15 reps;Limitations Heel Raises Limitations: toe raises intermittent HHA Lateral Step Up: Right;10 reps;Hand Hold: 1;Step Height: 4" Forward Step Up: Right;10 reps;Hand Hold: 1;Step Height: 4" Functional Squat: 10 reps Rocker Board: 2 minutes;Limitations Rocker Board Limitations: R/L and A/P Supine Quad Sets: 10 reps Terminal Knee Extension: 10 reps Knee Extension: PROM;3 sets Prone  Hamstring Curl: 10 reps;Limitations Hamstring Curl Limitations: 4# Hip Extension: 10 reps;Limitations Hip Extension Limitations: 4# Contract/Relax to Increase Flexion: 5x 10" holds   Modalities Modalities: Cryotherapy Cryotherapy Number Minutes Cryotherapy: 10 Minutes Cryotherapy Location: Knee Type of Cryotherapy: Ice pack  Physical Therapy Assessment and Plan PT Assessment and Plan Clinical Impression Statement: Began PT POC for Rt knee ROM and functional strengthening.  Pt able to complete all  exercises with min cueing following demonstration for proper technique.  Added stretches to improve flexibility and manual teachniques to improve knee extenion and flexion.  AROM for knee extension 10 degrees extension followng manual PROM.  Ice applied end of session for pain and edema control.   PT Plan: Continue current PT POC with strengthening exercises, stretches for ROM and manual technqiues including PROM.      Goals PT Short Term Goals PT Short Term Goal 1: Pt to be able to sit for 30 minutes to enjoy eating out and for more comfort with traveling PT Short Term Goal 1 - Progress: Progressing toward goal PT Short Term Goal 2: Pt to be able to walk for 30 minutes without stopping  PT Short Term Goal 2 - Progress: Progressing toward goal PT Long Term Goals PT Long Term Goal 1: Pain level to be at a 2/10 or less 80% of the day PT Long Term Goal 2: strength improved one grade to allow pt to go up and down steps in a reciprocal manner PT Long Term Goal 2 - Progress: Progressing toward goal Long Term Goal 3: Pt ROM to be -5 to 120 to allow normalized walking and squatting  Long Term Goal 3 Progress: Progressing toward goal Long Term Goal 4: 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 Stiffness of joint, lower leg 10/15/2014  . Difficulty walking 10/15/2014  . Weakness of right leg 10/15/2014  . OA (osteoarthritis) of knee 09/20/2014  . Knee stiffness 09/09/2012  . S/P left TKA 08/09/2012    PT - End of  Session Activity Tolerance: Patient tolerated treatment well General Behavior During Therapy: [facility] for tasks assessed/performed  GP    Aldona Lento 10/20/2014, 12:45 PM

## 2014-10-21 ENCOUNTER — Other Ambulatory Visit (HOSPITAL_COMMUNITY): Payer: Self-pay | Admitting: "Endocrinology

## 2014-10-21 DIAGNOSIS — E041 Nontoxic single thyroid nodule: Secondary | ICD-10-CM

## 2014-10-21 DIAGNOSIS — E042 Nontoxic multinodular goiter: Secondary | ICD-10-CM | POA: Diagnosis not present

## 2014-10-22 ENCOUNTER — Ambulatory Visit (HOSPITAL_COMMUNITY)
Admission: RE | Admit: 2014-10-22 | Discharge: 2014-10-22 | Disposition: A | Discharge status: Home / Self Care | Payer: Medicare Other | Source: Ambulatory Visit | Attending: Family Medicine | Admitting: Family Medicine

## 2014-10-22 DIAGNOSIS — Z471 Aftercare following joint replacement surgery: Secondary | ICD-10-CM | POA: Diagnosis not present

## 2014-10-22 DIAGNOSIS — M25561 Pain in right knee: Secondary | ICD-10-CM | POA: Diagnosis not present

## 2014-10-22 DIAGNOSIS — M25661 Stiffness of right knee, not elsewhere classified: Secondary | ICD-10-CM

## 2014-10-22 DIAGNOSIS — Z96651 Presence of right artificial knee joint: Secondary | ICD-10-CM | POA: Diagnosis not present

## 2014-10-22 DIAGNOSIS — M6281 Muscle weakness (generalized): Secondary | ICD-10-CM | POA: Diagnosis not present

## 2014-10-22 DIAGNOSIS — R29898 Other symptoms and signs involving the musculoskeletal system: Secondary | ICD-10-CM

## 2014-10-22 DIAGNOSIS — R262 Difficulty in walking, not elsewhere classified: Secondary | ICD-10-CM

## 2014-10-22 NOTE — Progress Notes (Signed)
Physical Therapy Treatment Patient Details  Name: Nicholas Lewis MRN: [id number] Date of Birth: June 06, 1941  Today's Date: 10/22/2014 Time: 1660-6301 PT Time Calculation (min): 23 min Charge: TE 1145-1240, Ice 1240-1252  Visit#: 3 of 12  Re-eval: 11/14/14 Assessment Diagnosis: Rt TKR Surgical Date: 09/20/14 Next MD Visit: Maureen Ralphs 10/26/2014 Prior Therapy: HH  Authorization: medicare  Authorization Time Period:    Authorization Visit#:   of     Subjective: Symptoms/Limitations Symptoms: Pt reports significant stiffness in the Rt knee today.  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   Pain Assessment Currently in Pain?: Yes Pain Score: 2  Pain Location: Knee Pain Orientation: Right Pain Type: Surgical pain  Objective:   Exercise/Treatments Stretches Active Hamstring Stretch: 3 reps;30 seconds Quad Stretch: 3 reps;30 seconds;Limitations Quad Stretch Limitations: prone with rope Gastroc Stretch: 3 reps;30 seconds;Limitations Gastroc Stretch Limitations: slant board Aerobic Stationary Bike: 10' seat 13 (Simultaneous filing. User may not have seen previous data.) Standing Heel Raises: 15 reps;Limitations Heel Raises Limitations: toe raises intermittent HHA Terminal Knee Extension: Right;10 reps;Theraband Theraband Level (Terminal Knee Extension): Level 3 (Green) Lateral Step Up: Right;15 reps;Hand Hold: 1;Step Height: 4" Forward Step Up: Right;15 reps;Hand Hold: 1;Step Height: 6" Functional Squat: 15 reps;3 seconds Rocker Board: 2 minutes;Limitations Rocker Board Limitations: R/L SLS: 60"+ first attempt Rt LE Supine Quad Sets: 10 reps Short Arc Quad Sets: 10 reps Terminal Knee Extension: 10 reps Knee Extension: PROM;3 sets Prone  Hamstring Curl: 10 reps;Limitations Hamstring Curl Limitations: 4# Hip Extension: 10 reps;Limitations Hip Extension Limitations:  4# Contract/Relax to Increase Flexion: 5x 10" holds Other Prone Exercises: TKE 10 x5"   Modalities Modalities: Cryotherapy Cryotherapy Number Minutes Cryotherapy: 10 Minutes Cryotherapy Location: Knee Type of Cryotherapy: Ice pack  Physical Therapy Assessment and Plan PT Assessment and Plan Clinical Impression Statement: Continued PT POC to improve quad strengthening for improved knee extension, stretches for improved flexibility to improve ROM and functional strengthening activities.  Added TKE standing and prone with no difficulty following instrctions for technique. to improve knee extension.  Ended session with ice for pain and edema control.   PT Plan: Continue current PT POC with strengthening exercises, stretches for ROM and manual technqiues including PROM.      Goals PT Short Term Goals PT Short Term Goal 1: Pt to be able to sit for 30 minutes to enjoy eating out and for more comfort with traveling PT Short Term Goal 1 - Progress: Progressing toward goal PT Short Term Goal 2: Pt to be able to walk for 30 minutes without stopping  PT Short Term Goal 2 - Progress: Progressing toward goal PT Long Term Goals PT Long Term Goal 1: Pain level to be at a 2/10 or less 80% of the day PT Long Term Goal 1 - Progress: Progressing toward goal PT Long Term Goal 2: strength improved one grade to allow pt to go up and down steps in a reciprocal manner PT Long Term Goal 2 - Progress: Progressing toward goal Long Term Goal 3: Pt ROM to be -5 to 120 to allow normalized walking and squatting  Long Term Goal 3 Progress: Progressing toward goal Long Term Goal 4: 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 Stiffness of joint, lower  leg 10/15/2014  . Difficulty walking 10/15/2014  . Weakness of right leg 10/15/2014  . OA (osteoarthritis) of knee 09/20/2014  . Knee stiffness 09/09/2012  . S/P left TKA 08/09/2012    PT - End of Session Activity Tolerance: Patient tolerated treatment well General Behavior During Therapy: [facility] for tasks assessed/performed  GP    Aldona Lento 10/22/2014, 1:31 PM

## 2014-10-25 ENCOUNTER — Ambulatory Visit (HOSPITAL_COMMUNITY)
Admission: RE | Admit: 2014-10-25 | Discharge: 2014-10-25 | Disposition: A | Discharge status: Home / Self Care | Payer: Medicare Other | Source: Ambulatory Visit | Attending: Family Medicine | Admitting: Family Medicine

## 2014-10-25 DIAGNOSIS — Z96651 Presence of right artificial knee joint: Secondary | ICD-10-CM | POA: Diagnosis not present

## 2014-10-25 DIAGNOSIS — M25561 Pain in right knee: Secondary | ICD-10-CM | POA: Diagnosis not present

## 2014-10-25 DIAGNOSIS — Z471 Aftercare following joint replacement surgery: Secondary | ICD-10-CM | POA: Diagnosis not present

## 2014-10-25 DIAGNOSIS — M25661 Stiffness of right knee, not elsewhere classified: Secondary | ICD-10-CM | POA: Diagnosis not present

## 2014-10-25 DIAGNOSIS — M6281 Muscle weakness (generalized): Secondary | ICD-10-CM | POA: Diagnosis not present

## 2014-10-25 DIAGNOSIS — R262 Difficulty in walking, not elsewhere classified: Secondary | ICD-10-CM | POA: Diagnosis not present

## 2014-10-25 NOTE — Progress Notes (Signed)
Physical Therapy Treatment Patient Details  Name: Nicholas Lewis MRN: [id number] Date of Birth: Jun 22, 1941  Today's Date: 10/25/2014 Time: 6195-0932 PT Time Calculation (min): 49 min TE 6712-4580  Visit#: 4 of 12  Re-eval: 11/14/14 Assessment Diagnosis: Rt TKR Surgical Date: 09/20/14 Next MD Visit: Maureen Ralphs 10/26/2014  Subjective: Symptoms/Limitations Symptoms: Pt reports some sciatic nerve pain down the Rt leg to the knee after sitting in car for extended period of time on saturday to 4/10.    How long can you sit comfortably?: Pt reports he is able to sit for 30 minutes prior to having to stand secondary to pain. (was 10 minutes) How long can you walk comfortably?: Pt reports he is able to amb for 30 minutes during shopping activities. (was 20 minutes) Pain Assessment Currently in Pain?: Yes Pain Score: 1  (Best - 0/10, Worst 5-6/10 at night) Pain Location: Knee Pain Orientation: Right Pain Type: Surgical pain   Exercise/Treatments Stretches Active Hamstring Stretch: 3 reps;20 seconds;Limitations Active Hamstring Stretch Limitations: 14" Box, 3-way Quad Stretch: 3 reps;30 seconds;Limitations Quad Stretch Limitations: prone with rope Piriformis Stretch: 2 reps;30 seconds;Limitations Piriformis Stretch Limitations: Seated Gastroc Stretch: 3 reps;30 seconds;Limitations Gastroc Stretch Limitations: slant board Aerobic Stationary Bike: 10' seat 13 Standing Lateral Step Up: 15 reps;Hand Hold: 2;Step Height: 8";Right Forward Step Up: 15 reps;Hand Hold: 1;Step Height: 8";Right Supine Quad Sets: 10 reps;Limitations Quad Sets Limitations: 5" Hold with heel prop Heel Slides: 10 reps Straight Leg Raises: 10 reps;Right;Limitations Straight Leg Raises Limitations: Initiated with quad set Knee Extension: PROM Knee Flexion: PROM Prone  Hamstring Curl: 15 reps Hamstring Curl Limitations: 4# Hip Extension: 15 reps Hip Extension Limitations: 4#      Physical Therapy  Assessment and Plan PT Assessment and Plan Clinical Impression Statement: Measurements/functional mobility updated for MD note - faxed to MD. Noted improvements in knee extension from IE.  Pt reported some sciatic symptoms over the weekend and into today, and piriformis stretching added with education to continue stretch at home. Pt was able to increase height of step ups today maintaining only 1 HHA; VC to was required about ~50% of the time to decrease circumduction to clear step.   Pt will benefit from skilled therapeutic intervention in order to improve on the following deficits: Pain;Decreased balance;Decreased range of motion;Difficulty walking;Decreased strength Rehab Potential: Good PT Frequency: Min 2X/week PT Duration: 4 weeks PT Treatment/Interventions: Gait training;Functional mobility training;Therapeutic activities;Therapeutic exercise;Manual techniques;Patient/family education PT Plan: Progress ROM exercises, working on improving knee flexion and extension.  Continue strengthening exercises, correcting technique (avoiding circumduction) as able.      Goals PT Short Term Goals PT Short Term Goal 1: Pt to be able to sit for 30 minutes to enjoy eating out and for more comfort with traveling PT Short Term Goal 1 - Progress: Met PT Short Term Goal 2: Pt to be able to walk for 30 minutes without stopping  PT Short Term Goal 2 - Progress: Progressing toward goal  Problem List Patient Active Problem List   Diagnosis Date Noted  . Stiffness of joint, lower leg 10/15/2014  . Difficulty walking 10/15/2014  . Weakness of right leg 10/15/2014  . OA (osteoarthritis) of knee 09/20/2014  . Knee stiffness 09/09/2012  . S/P left TKA 08/09/2012    PT - End of Session Activity Tolerance: Patient tolerated treatment well General Behavior During Therapy: [facility] for tasks assessed/performed   Raena Pau 10/25/2014, 1:48 PM

## 2014-10-26 DIAGNOSIS — Z471 Aftercare following joint replacement surgery: Secondary | ICD-10-CM | POA: Diagnosis not present

## 2014-10-26 DIAGNOSIS — Z96651 Presence of right artificial knee joint: Secondary | ICD-10-CM | POA: Diagnosis not present

## 2014-10-27 ENCOUNTER — Ambulatory Visit (HOSPITAL_COMMUNITY)
Admission: RE | Admit: 2014-10-27 | Discharge: 2014-10-27 | Disposition: A | Discharge status: Home / Self Care | Payer: Medicare Other | Source: Ambulatory Visit | Attending: Family Medicine | Admitting: Family Medicine

## 2014-10-27 ENCOUNTER — Encounter (HOSPITAL_COMMUNITY): Payer: Self-pay

## 2014-10-27 ENCOUNTER — Ambulatory Visit (HOSPITAL_COMMUNITY)
Admission: RE | Admit: 2014-10-27 | Discharge: 2014-10-27 | Disposition: A | Discharge status: Home / Self Care | Payer: Medicare Other | Source: Ambulatory Visit | Attending: "Endocrinology | Admitting: "Endocrinology

## 2014-10-27 DIAGNOSIS — Z96651 Presence of right artificial knee joint: Secondary | ICD-10-CM | POA: Diagnosis not present

## 2014-10-27 DIAGNOSIS — R29898 Other symptoms and signs involving the musculoskeletal system: Secondary | ICD-10-CM

## 2014-10-27 DIAGNOSIS — E0789 Other specified disorders of thyroid: Secondary | ICD-10-CM | POA: Diagnosis not present

## 2014-10-27 DIAGNOSIS — E041 Nontoxic single thyroid nodule: Secondary | ICD-10-CM | POA: Diagnosis not present

## 2014-10-27 DIAGNOSIS — R262 Difficulty in walking, not elsewhere classified: Secondary | ICD-10-CM

## 2014-10-27 DIAGNOSIS — M6281 Muscle weakness (generalized): Secondary | ICD-10-CM | POA: Diagnosis not present

## 2014-10-27 DIAGNOSIS — Z471 Aftercare following joint replacement surgery: Secondary | ICD-10-CM | POA: Diagnosis not present

## 2014-10-27 DIAGNOSIS — M25661 Stiffness of right knee, not elsewhere classified: Secondary | ICD-10-CM

## 2014-10-27 DIAGNOSIS — M25561 Pain in right knee: Secondary | ICD-10-CM | POA: Diagnosis not present

## 2014-10-27 MED ORDER — LIDOCAINE HCL (PF) 2 % IJ SOLN
INTRAMUSCULAR | Status: AC
  Administered 2014-10-27: 10 mL
  Filled 2014-10-27: qty 10

## 2014-10-27 NOTE — Progress Notes (Signed)
Physical Therapy Treatment Patient Details  Name: Nicholas Lewis MRN: [id number] Date of Birth: Mar 31, 1941  Today's Date: 10/27/2014 Time: 1443-1540 PT Time Calculation (min): 54 min Charge: TE 0867-6195, Ice 0932-6712  Visit#: 5 of 12  Re-eval: 11/14/14 Assessment Diagnosis: Rt TKR Surgical Date: 09/20/14 Next MD Visit: Maureen Ralphs 11/30/2014 Prior Therapy: HH  Authorization: medicare  Authorization Time Period:    Authorization Visit#:   of     Subjective: Symptoms/Limitations Symptoms: Pt stated knee feels tight today.  Had MD apt today, doctor happy with knee progress.  Pain scale range from 1-6/10.  Pt had biopsy on Lt thyroid.  Reports he feels his sciatic nerve has been irritated, reports pain back of thigh and lateral below knee pain most frequent with side moions.   Pain Assessment Currently in Pain?: Yes Pain Score: 1  Pain Location: Knee Pain Orientation: Right   Exercise/Treatments Stretches Active Hamstring Stretch: 3 reps;30 seconds;Limitations Active Hamstring Stretch Limitations: supine with rope Quad Stretch: 3 reps;30 seconds;Limitations Quad Stretch Limitations: prone with rope ITB Stretch: 3 reps;30 seconds;Limitations ITB Stretch Limitations: supine with rope Piriformis Stretch: 2 reps;30 seconds;Limitations Piriformis Stretch Limitations: Seated; 2x supine with manual Aerobic Stationary Bike: 8' seat 13 Supine Quad Sets: 10 reps;Limitations Heel Slides: 5 reps Terminal Knee Extension: 10 reps Knee Extension: PROM Knee Flexion: PROM Sidelying Abduction 10x Prone  Contract/Relax to Increase Flexion: 5x 10" holds Other Prone Exercises: TKE 10 x5"   Modalities Modalities: Cryotherapy Cryotherapy Number Minutes Cryotherapy: 10 Minutes Cryotherapy Location: Knee Type of Cryotherapy: Ice pack  Physical Therapy Assessment and Plan PT Assessment and Plan Clinical Impression Statement: Pt limited by pain on lateral aspect Lt knee.  Noted tight  ITB upon palpation with reports of pain near fibula head.  Added ITB stretches and continued with piriformis stretches to reduce sciatic symptoms  Pt educated on landmarks for sciatic and peroneal nerve following reports of pain on lateral knee.  Session focus on improving ROM primarily extension and pain control.  AROM 5-110. PT Plan: Progress ROM exercises, working on improving knee flexion and extension.  Continue strengthening exercises, correcting technique (avoiding circumduction) as able.      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 Stiffness of joint, lower leg 10/15/2014  . Difficulty walking 10/15/2014  . Weakness of right leg 10/15/2014  . OA (osteoarthritis) of knee 09/20/2014  . Knee stiffness 09/09/2012  . S/P left TKA 08/09/2012    PT - End of Session Activity Tolerance: Patient tolerated treatment well;Patient limited  by pain General Behavior During Therapy: [facility] for tasks assessed/performed  GP    Aldona Lento 10/27/2014, 1:57 PM

## 2014-10-27 NOTE — Discharge Instructions (Signed)
Thyroid Biopsy °The thyroid gland is a butterfly-shaped gland situated in the front of the neck. It produces hormones which affect metabolism, growth and development, and body temperature. A thyroid biopsy is a procedure in which small samples of tissue or fluid are removed from the thyroid gland or mass and examined under a microscope. This test is done to determine the cause of thyroid problems, such as infection, cancer, or other thyroid problems. °There are 2 ways to obtain samples: °1. Fine needle biopsy. Samples are removed using a thin needle inserted through the skin and into the thyroid gland or mass. °2. Open biopsy. Samples are removed after a cut (incision) is made through the skin. °LET YOUR CAREGIVER KNOW ABOUT:  °· Allergies. °· Medications taken including herbs, eye drops, over-the-counter medications, and creams. °· Use of steroids (by mouth or creams). °· Previous problems with anesthetics or numbing medicine. °· Possibility of pregnancy, if this applies. °· History of blood clots (thrombophlebitis). °· History of bleeding or blood problems. °· Previous surgery. °· Other health problems. °RISKS AND COMPLICATIONS °· Bleeding from the site. The risk of bleeding is higher if you have a bleeding disorder or are taking any blood thinning medications (anticoagulants). °· Infection. °· Injury to structures near the thyroid gland. °BEFORE THE PROCEDURE  °This is a procedure that can be done as an outpatient. Confirm the time that you need to arrive for your procedure. Confirm whether there is a need to fast or withhold any medications. A blood sample may be done to determine your blood clotting time. Medicine may be given to help you relax (sedative). °PROCEDURE °Fine needle biopsy. °You will be awake during the procedure. You may be asked to lie on your back with your head tipped backward to extend your neck. Let your caregiver know if you cannot tolerate the positioning. An area on your neck will be  cleansed. A needle is inserted through the skin of your neck. You may feel a mild discomfort during this procedure. You may be asked to avoid coughing, talking, swallowing, or making sounds during some portions of the procedure. The needle is withdrawn once tissue or fluid samples have been removed. Pressure may be applied to the neck to reduce swelling and ensure that bleeding has stopped. The samples will be sent for examination.  °Open biopsy. °You will be given general anesthesia. You will be asleep during the procedure. An incision is made in your neck. A sample of thyroid tissue or the mass is removed. The tissue sample or mass will be sent for examination. The sample or mass may be examined during the biopsy. If the sample or mass contains cancer cells, some or all of the thyroid gland may be removed. The incision is closed with stitches. °AFTER THE PROCEDURE  °Your recovery will be assessed and monitored. If there are no problems, as an outpatient, you should be able to go home shortly after the procedure. °If you had a fine needle biopsy: °· You may have soreness at the biopsy site for 1 to 2 days. °If you had an open biopsy:  °· You may have soreness at the biopsy site for 3 to 4 days. °· You may have a hoarse voice or sore throat for 1 to 2 days. °Obtaining the Test Results °It is your responsibility to obtain your test results. Do not assume everything is normal if you have not heard from your caregiver or the medical facility. It is important for you to follow up   on all of your test results. °HOME CARE INSTRUCTIONS  °· Keeping your head raised on a pillow when you are lying down may ease biopsy site discomfort. °· Supporting the back of your head and neck with both hands as you sit up from a lying position may ease biopsy site discomfort. °· Only take over-the-counter or prescription medicines for pain, discomfort, or fever as directed by your caregiver. °· Throat lozenges or gargling with warm salt  water may help to soothe a sore throat. °SEEK IMMEDIATE MEDICAL CARE IF:  °· You have severe bleeding from the biopsy site. °· You have difficulty swallowing. °· You have a fever. °· You have increased pain, swelling, redness, or warmth at the biopsy site. °· You notice pus coming from the biopsy site. °· You have swollen glands (lymph nodes) in your neck. °Document Released: 10/14/2007 Document Revised: 04/13/2013 Document Reviewed: 03/11/2014 °[employer]® Patient Information ©2015 [employer]. This information is not intended to replace advice given to you by your health care provider. Make sure you discuss any questions you have with your health care provider. ° °

## 2014-11-01 ENCOUNTER — Encounter (HOSPITAL_COMMUNITY): Payer: Self-pay | Admitting: Physical Therapy

## 2014-11-01 ENCOUNTER — Ambulatory Visit (HOSPITAL_COMMUNITY)
Admission: RE | Admit: 2014-11-01 | Discharge: 2014-11-01 | Disposition: A | Discharge status: Home / Self Care | Payer: Medicare Other | Source: Ambulatory Visit | Attending: Family Medicine | Admitting: Family Medicine

## 2014-11-01 DIAGNOSIS — Z96651 Presence of right artificial knee joint: Secondary | ICD-10-CM | POA: Insufficient documentation

## 2014-11-01 DIAGNOSIS — M6281 Muscle weakness (generalized): Secondary | ICD-10-CM | POA: Diagnosis not present

## 2014-11-01 DIAGNOSIS — Z471 Aftercare following joint replacement surgery: Secondary | ICD-10-CM | POA: Diagnosis not present

## 2014-11-01 DIAGNOSIS — R262 Difficulty in walking, not elsewhere classified: Secondary | ICD-10-CM | POA: Insufficient documentation

## 2014-11-01 DIAGNOSIS — M25661 Stiffness of right knee, not elsewhere classified: Secondary | ICD-10-CM | POA: Diagnosis not present

## 2014-11-01 DIAGNOSIS — M25561 Pain in right knee: Secondary | ICD-10-CM | POA: Diagnosis not present

## 2014-11-01 DIAGNOSIS — R29898 Other symptoms and signs involving the musculoskeletal system: Secondary | ICD-10-CM

## 2014-11-01 NOTE — Therapy (Signed)
Physical Therapy Treatment  Patient Details  Name: Nicholas Lewis MRN: [id number] Date of Birth: 1941/07/01  Encounter Date: 11/01/2014      PT End of Session - 11/01/14 1354    Activity Tolerance Patient tolerated treatment well;Patient limited by pain      Past Medical History  Diagnosis Date  . Complication of anesthesia     slow to wake up  . Hypertension   . Arthritis   . Gout   . Abrasion     l arm  . Cellulitis 07/2012    after surgery for left knee  . Cancer     skin    Past Surgical History  Procedure Laterality Date  . Knee arthroscopy  2000    left  . Cystectomy  2003    from spine  . Total knee arthroplasty  08/06/2012    Procedure: TOTAL KNEE ARTHROPLASTY;  Surgeon: Magnus Sinning, MD;  Location: [facility];  Service: Orthopedics;  Laterality: Left;  . Total knee arthroplasty Right 09/20/2014    Procedure: RIGHT TOTAL KNEE ARTHROPLASTY;  Surgeon: Gearlean Alf, MD;  Location: [facility];  Service: Orthopedics;  Laterality: Right;  . Joint replacement    . Back surgery      There were no vitals taken for this visit.  Visit Diagnosis:  Stiffness of joint, lower leg, right  Difficulty walking  Weakness of right leg       Adult PT Treatment/Exercise - 11/01/14 0700    Active Hamstring Stretch 3 reps;30 seconds;Limitations   Active Hamstring Stretch Limitations supine with rope   Quad Stretch 3 reps;30 seconds;Limitations   Quad Stretch Limitations prone with rope   Hip Flexor Stretch Limitations Groin stretch 2 way to 8" 20seconds 2x   ITB Stretch 3 reps;30 seconds;Limitations   ITB Stretch Limitations supine with rope   Piriformis Stretch 2 reps;30 seconds;Limitations   Piriformis Stretch Limitations Seated; 2x supine with manual   Heel Raises 15 reps;Limitations   Heel Raises Limitations toe raises intermittent HHA   Lateral Step Up 15 reps;Hand Hold: 2;Step Height: 8";Right   Forward Step Up 15 reps;Hand Hold: 1;Step Height: 8";Right   Functional Squat 15 reps;3 seconds   Quad Sets Limitations;20 reps   Quad Sets Limitations 3" hold   Heel Slides 5 reps   Terminal Knee Extension 10 reps   Bridges 10 reps;AROM;Both   Hip ABduction 10 reps   Hamstring Curl 15 reps   Hamstring Curl Limitations 4#   Hip Extension 15 reps   Hip Extension Limitations 4#          Education - 11/01/14 1323    Education provided No          PT Short Term Goals - 11/01/14 1327    Title Pt to be able to sit for 30 minutes to enjoy eating out and for more comfort with traveling   Title Pt to be able to walk for 30 minutes without stopping            PT Long Term Goals - 11/01/14 1332    Title Pain level to be at a 2/10 or less 80% of the day   Title strength improved one grade to allow pt to go up and down steps in a reciprocal manner   Title Pt ROM to be -5 to 120 to allow normalized walking and squatting     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 Following groin stretches patient ambulated with increased stride width noting decreased pain on lateral knee following. limited sqaut depth displayed indicating funtionally weak Rt LE.  Manual therapy performed including Rt sciatic nerve flossing and soft tissue mobilization of  lateral quad ITband and biceps femoris (19minutes)          G-Codes - 11-03-14 1351    Functional Assessment Tool Used foto   Functional Limitation Mobility: Walking and moving around   Mobility: Walking and Moving Around Current  Status [phone]) At least 40 percent but less than 60 percent impaired, limited or restricted   Mobility: Walking and Moving Around Goal Status [phone]) At least 20 percent but less than 40 percent impaired, limited or restricted      Problem List Patient Active Problem List   Diagnosis Date Noted  . Stiffness of joint, lower leg 10/15/2014  . Difficulty walking 10/15/2014  . Weakness of right leg 10/15/2014  . OA (osteoarthritis) of knee 09/20/2014  . Knee stiffness 09/09/2012  . S/P left TKA 08/09/2012    Twinkle Sockwell R Nov 03, 2014, 2:15 PM

## 2014-11-03 ENCOUNTER — Encounter (HOSPITAL_COMMUNITY): Payer: Self-pay | Admitting: Physical Therapy

## 2014-11-03 ENCOUNTER — Ambulatory Visit (HOSPITAL_COMMUNITY)
Admission: RE | Admit: 2014-11-03 | Discharge: 2014-11-03 | Disposition: A | Discharge status: Home / Self Care | Payer: Medicare Other | Source: Ambulatory Visit | Attending: Family Medicine | Admitting: Family Medicine

## 2014-11-03 DIAGNOSIS — Z471 Aftercare following joint replacement surgery: Secondary | ICD-10-CM | POA: Diagnosis not present

## 2014-11-03 DIAGNOSIS — R262 Difficulty in walking, not elsewhere classified: Secondary | ICD-10-CM

## 2014-11-03 DIAGNOSIS — R29898 Other symptoms and signs involving the musculoskeletal system: Secondary | ICD-10-CM

## 2014-11-03 DIAGNOSIS — M25661 Stiffness of right knee, not elsewhere classified: Secondary | ICD-10-CM

## 2014-11-03 NOTE — Therapy (Signed)
Physical Therapy Treatment  Patient Details  Name: Nicholas Lewis MRN: [id number] Date of Birth: 11/09/1941  Encounter Date: 11/03/2014      PT End of Session - 11/03/14 1045    Visit Number 7   Number of Visits 12   Date for PT Re-Evaluation 11/14/14   Authorization Type Medicare   Authorization - Visit Number 7   Authorization - Number of Visits 12   PT Start Time 1017   PT Stop Time 1100   PT Time Calculation (min) 43 min   Activity Tolerance Patient tolerated treatment well;Patient limited by pain      Past Medical History  Diagnosis Date  . Complication of anesthesia     slow to wake up  . Hypertension   . Arthritis   . Gout   . Abrasion     l arm  . Cellulitis 07/2012    after surgery for left knee  . Cancer     skin    Past Surgical History  Procedure Laterality Date  . Knee arthroscopy  2000    left  . Cystectomy  2003    from spine  . Total knee arthroplasty  08/06/2012    Procedure: TOTAL KNEE ARTHROPLASTY;  Surgeon: Magnus Sinning, MD;  Location: [facility];  Service: Orthopedics;  Laterality: Left;  . Total knee arthroplasty Right 09/20/2014    Procedure: RIGHT TOTAL KNEE ARTHROPLASTY;  Surgeon: Gearlean Alf, MD;  Location: [facility];  Service: Orthopedics;  Laterality: Right;  . Joint replacement    . Back surgery      There were no vitals taken for this visit.  Visit Diagnosis:  Stiffness of joint, lower leg, right  Difficulty walking  Weakness of right leg        Adult PT Treatment/Exercise - 11/03/14 0700    Knee/Hip Exercises: Stretches   Active Hamstring Stretch 30 seconds;Limitations;5 reps   Active Hamstring Stretch Limitations  in sagittal plane with Lateral hamstring focus during final 3 repetitions.    Quad Stretch 3 reps;Limitations;20 seconds   Quad Stretch Limitations prone with rope   ITB Stretch 3 reps;30 seconds;Limitations   ITB Stretch Limitations supine with rope   Piriformis Stretch Limitations;5 reps;20 seconds   Piriformis Stretch Limitations therapist assist   Knee/Hip Exercises: Standing   Heel Raises 15 reps;Limitations   Heel Raises Limitations toe raises intermittent HHA   Forward Lunges 5 reps   Forward Lunges Limitations anterior lateral to 8" box   Other Standing Knee Exercises 3D hip excursions   Other Standing Knee Exercises Walking 2d hip excursions 3ft each   Knee/Hip Exercises: Supine   Bridges 10 reps;AROM;Both;2 sets   Other Supine Knee Exercises Trunk rotation 10x each.    Manual Therapy   Joint Mobilization anterior to posterior mobilizations to improve knee flexxion grade 3   Myofascial Release Rt lateral ITband, lateral hamstrings and piriformis          PT Short Term Goals - 11/03/14 1051    PT SHORT TERM GOAL #1   Title Pt to be able to sit for 30 minutes to enjoy eating out and for more comfort with traveling   PT [place] #2   Title Pt to be able to walk for 30 minutes without stopping            PT Long Term Goals - 11/03/14 1052    PT LONG TERM GOAL #1   Title Pain level to be at a  2/10 or less 80% of the day   PT LONG TERM GOAL #2   Title strength improved one grade to allow pt to go up and down steps in a reciprocal manner   PT LONG TERM GOAL #3   Title Pt ROM to be -5 to 120 to allow normalized walking and squatting     PT LONG TERM GOAL #4   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 This is likely placing increased stain on muscle not ready to be moved through a more efficient gait pattern yet. Followign manual therapyu patient noted decreased  knee and hip pain. Following stretches and gait training activities patient noted further imrpovement in pain. Patient initially dmeosntrated a narrow gait and excessive valgus moment durign gait as well as excessive varus moment attributed to IITband and glut tightness, this was much improved follwoign stretches and exercises   PT Plan Progress ROM exercises, working on improving knee flexion and extension.  Continue strengthening exercises, correcting technique (avoiding circumduction) as able.       Problem List Patient Active Problem List   Diagnosis Date Noted  . Stiffness of joint, lower leg 10/15/2014  . Difficulty walking 10/15/2014  . Weakness of right leg 10/15/2014  . OA (osteoarthritis) of knee 09/20/2014  . Knee stiffness 09/09/2012  . S/P left TKA 08/09/2012   [name] R 11/03/2014, 11:05 AM

## 2014-11-04 DIAGNOSIS — E042 Nontoxic multinodular goiter: Secondary | ICD-10-CM | POA: Diagnosis not present

## 2014-11-08 ENCOUNTER — Encounter (HOSPITAL_COMMUNITY): Payer: Self-pay | Admitting: Physical Therapy

## 2014-11-08 ENCOUNTER — Ambulatory Visit (HOSPITAL_COMMUNITY)
Admission: RE | Admit: 2014-11-08 | Discharge: 2014-11-08 | Disposition: A | Discharge status: Home / Self Care | Payer: Medicare Other | Source: Ambulatory Visit | Attending: Family Medicine | Admitting: Family Medicine

## 2014-11-08 DIAGNOSIS — Z471 Aftercare following joint replacement surgery: Secondary | ICD-10-CM | POA: Diagnosis not present

## 2014-11-08 DIAGNOSIS — R29898 Other symptoms and signs involving the musculoskeletal system: Secondary | ICD-10-CM

## 2014-11-08 DIAGNOSIS — R262 Difficulty in walking, not elsewhere classified: Secondary | ICD-10-CM

## 2014-11-08 DIAGNOSIS — M25661 Stiffness of right knee, not elsewhere classified: Secondary | ICD-10-CM

## 2014-11-08 NOTE — Addendum Note (Signed)
Encounter addended by: Leeroy Cha, PT on: 11/08/2014  1:56 PM<BR>     Documentation filed: Clinical Notes, Flowsheet VN

## 2014-11-08 NOTE — Therapy (Addendum)
Physical Therapy Treatment  Patient Details  Name: Nicholas Lewis MRN: [id number] Date of Birth: [birth year]  Encounter Date: 11/08/2014      PT End of Session - 11/08/14 1355    PT Start Time 0845   PT Stop Time 0938   PT Time Calculation (min) 53 min     visit 8/12 Past Medical History  Diagnosis Date  . Complication of anesthesia     slow to wake up  . Hypertension   . Arthritis   . Gout   . Abrasion     l arm  . Cellulitis 07/2012    after surgery for left knee  . Cancer     skin    Past Surgical History  Procedure Laterality Date  . Knee arthroscopy  2000    left  . Cystectomy  2003    from spine  . Total knee arthroplasty  08/06/2012    Procedure: TOTAL KNEE ARTHROPLASTY;  Surgeon: Magnus Sinning, MD;  Location: [facility];  Service: Orthopedics;  Laterality: Left;  . Total knee arthroplasty Right 09/20/2014    Procedure: RIGHT TOTAL KNEE ARTHROPLASTY;  Surgeon: Gearlean Alf, MD;  Location: [facility];  Service: Orthopedics;  Laterality: Right;  . Joint replacement    . Back surgery      There were no vitals taken for this visit.  Visit Diagnosis:  Stiffness of joint, lower leg, right  Difficulty walking  Weakness of right leg  Pt states he is doing his exercises; denies pain.      [facility] Adult PT Treatment/Exercise - 11/08/14 0856    Knee/Hip Exercises: Stretches   Active Hamstring Stretch 30 seconds;3 reps   Passive Hamstring Stretch 30 seconds;3 reps   Hip Flexor Stretch --  knee to chest 3\' 30"    ITB Stretch 30 seconds   Piriformis Stretch 2 reps;30 seconds   Piriformis Stretch Limitations therapist assist   Gastroc Stretch 3 reps;30 seconds   Gastroc Stretch Limitations slant board   Knee/Hip Exercises: Aerobic   Stationary Bike nustep seat 12; hills 3; L5 x 10'   Knee/Hip Exercises: Standing   Heel Raises 5 reps;Limitations   Heel Raises Limitations combined with deep squat    Forward Lunges 10 reps   Lateral Step Up Step Height: 6";10  reps   Forward Step Up 10 reps   Functional Squat 5 reps   Knee/Hip Exercises: Supine   Quad Sets 10 reps   Heel Slides 5 reps   Knee Extension PROM                Plan - 11/08/14 0930    Clinical Impression Statement Pt has good strength and  balance; current ROM is 10-120 degrees.  Pt progressing well.  Treatment to emphasis knee exension.   Rehab Potential Good   PT Next Visit Plan continue to monitor back pain ,(getting better per pt); emphasis exension.    PT Plan continue to monitor back pain ,(getting better per pt); emphasis exension.         Problem List Patient Active Problem List   Diagnosis Date Noted  . Stiffness of joint, lower leg 10/15/2014  . Difficulty walking 10/15/2014  . Weakness of right leg 10/15/2014  . OA (osteoarthritis) of knee 09/20/2014  . Knee stiffness 09/09/2012  . S/P left TKA 08/09/2012         RUSSELL,CINDY PT 11/08/2014, 1:56 PM

## 2014-11-09 DIAGNOSIS — I10 Essential (primary) hypertension: Secondary | ICD-10-CM | POA: Diagnosis not present

## 2014-11-09 DIAGNOSIS — Z6831 Body mass index (BMI) 31.0-31.9, adult: Secondary | ICD-10-CM | POA: Diagnosis not present

## 2014-11-09 DIAGNOSIS — Z125 Encounter for screening for malignant neoplasm of prostate: Secondary | ICD-10-CM | POA: Diagnosis not present

## 2014-11-09 DIAGNOSIS — R7301 Impaired fasting glucose: Secondary | ICD-10-CM | POA: Diagnosis not present

## 2014-11-09 DIAGNOSIS — Z0000 Encounter for general adult medical examination without abnormal findings: Secondary | ICD-10-CM | POA: Diagnosis not present

## 2014-11-09 DIAGNOSIS — Z23 Encounter for immunization: Secondary | ICD-10-CM | POA: Diagnosis not present

## 2014-11-10 ENCOUNTER — Ambulatory Visit (HOSPITAL_COMMUNITY)
Admission: RE | Admit: 2014-11-10 | Discharge: 2014-11-10 | Disposition: A | Discharge status: Home / Self Care | Payer: Medicare Other | Source: Ambulatory Visit | Attending: Family Medicine | Admitting: Family Medicine

## 2014-11-10 DIAGNOSIS — M25661 Stiffness of right knee, not elsewhere classified: Secondary | ICD-10-CM

## 2014-11-10 DIAGNOSIS — R29898 Other symptoms and signs involving the musculoskeletal system: Secondary | ICD-10-CM

## 2014-11-10 DIAGNOSIS — R262 Difficulty in walking, not elsewhere classified: Secondary | ICD-10-CM

## 2014-11-10 DIAGNOSIS — Z471 Aftercare following joint replacement surgery: Secondary | ICD-10-CM | POA: Diagnosis not present

## 2014-11-10 NOTE — Addendum Note (Signed)
Encounter addended by: Susy Frizzle, PTA on: 11/10/2014 10:26 AM<BR>     Documentation filed: Flowsheet VN, Follow-up Section, Clinical Notes

## 2014-11-10 NOTE — Patient Instructions (Signed)
Knee Extension Mobilization: Hang (Prone)   With table supporting thighs, place ____ pound weight on right ankle. Hold 2-30 minutes gradually increase weekly Repeat 1-2 times per set. Do1_ sets per session. Do 1-2sessions per day.  http://orth.exer.us/722   Copyright  VHI. All rights reserved.

## 2014-11-10 NOTE — Therapy (Addendum)
Physical Therapy Treatment  Patient Details  Name: OVAL CAVAZOS MRN: [id number] Date of Birth: 1941-04-10  Encounter Date: 11/10/2014      PT End of Session - 11/10/14 0942    Visit Number 9   Number of Visits 12   Date for PT Re-Evaluation 11/14/14   Authorization Type Medicare   Authorization - Visit Number 9   Authorization - Number of Visits 10   PT Start Time 0930   PT Stop Time 1022   PT Time Calculation (min) 52 min   Activity Tolerance Patient tolerated treatment well   Behavior During Therapy [facility] for tasks assessed/performed      Past Medical History  Diagnosis Date  . Complication of anesthesia     slow to wake up  . Hypertension   . Arthritis   . Gout   . Abrasion     l arm  . Cellulitis 07/2012    after surgery for left knee  . Cancer     skin    Past Surgical History  Procedure Laterality Date  . Knee arthroscopy  2000    left  . Cystectomy  2003    from spine  . Total knee arthroplasty  08/06/2012    Procedure: TOTAL KNEE ARTHROPLASTY;  Surgeon: Magnus Sinning, MD;  Location: [facility];  Service: Orthopedics;  Laterality: Left;  . Total knee arthroplasty Right 09/20/2014    Procedure: RIGHT TOTAL KNEE ARTHROPLASTY;  Surgeon: Gearlean Alf, MD;  Location: [facility];  Service: Orthopedics;  Laterality: Right;  . Joint replacement    . Back surgery      There were no vitals taken for this visit.  Visit Diagnosis:  Stiffness of joint, lower leg, right  Difficulty walking  Weakness of right leg      Subjective Assessment - 11/10/14 0937    Symptoms Currently pain free knee and back today, not c/o radicular symptoms today.  Pt stated stairs are improving, continues to have difficutly descending stairs at home.     Currently in Pain? No/denies          [facility] PT Assessment - 11/10/14 0001    Assessment   Medical Diagnosis Rt TKR   Next MD Visit Alusio 11/30/2014   Prior Therapy HH          [facility] Adult PT Treatment/Exercise -  11/10/14 0001    Knee/Hip Exercises: Stretches   Active Hamstring Stretch 3 reps;30 seconds;Limitations   Active Hamstring Stretch Limitations standing 14in step; retro hamstirng walk with yellow ball x 30 feet   Quad Stretch 3 reps;Limitations;30 seconds   Quad Stretch Limitations prone with rope   Hip Flexor Stretch 2 reps;30 seconds   Hip Flexor Stretch Limitations on 14in step   Piriformis Stretch 3 reps;30 seconds;Limitations   Piriformis Stretch Limitations seated   Gastroc Stretch 3 reps;30 seconds   Gastroc Stretch Limitations slant board   Knee/Hip Exercises: Standing   Heel Raises 10 reps;Limitations   Heel Raises Limitations combined with deep squat    Terminal Knee Extension Right;10 reps;Theraband   Theraband Level (Terminal Knee Extension) Level 4 (Blue)   Lateral Step Up Step Height: 6";10 reps   Forward Step Up 15 reps;Step Height: 6";Hand Hold: 0   Step Down Right;10 reps;Hand Hold: 1;Step Height: 6"   Knee/Hip Exercises: Supine   Short Arc Quad Sets 15 reps   Terminal Knee Extension 15 reps   Knee Extension PROM   Knee/Hip Exercises: Prone   Other  Prone Exercises TKE 10 x5"          PT Education - 11/10/14 1017    Education provided Yes   Education Details Extension exercise, instructed prone knee hang    Person(s) Educated Patient   Methods Explanation;Demonstration   Comprehension Verbalized understanding          PT Short Term Goals - 11/10/14 1018    PT SHORT TERM GOAL #1   Title Pt to be able to sit for 30 minutes to enjoy eating out and for more comfort with traveling   Status On-going   PT SHORT TERM GOAL #2   Title Pt to be able to walk for 30 minutes without stopping     Status On-going          PT Long Term Goals - 11/10/14 1019    PT LONG TERM GOAL #1   Title Pain level to be at a 2/10 or less 80% of the day   PT LONG TERM GOAL #2   Title strength improved one grade to allow pt to go up and down steps in a reciprocal manner    Status On-going   PT LONG TERM GOAL #3   Title Pt ROM to be -5 to 120 to allow normalized walking and squatting     Status On-going   PT LONG TERM GOAL #4   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  AROM 9 degrees extension at end of session.     PT Plan continue to monitor back pain ,(getting better per pt); emphasis knee exension. Complete Gcode next session.        Problem List Patient Active Problem List   Diagnosis Date Noted  . Stiffness of joint, lower leg 10/15/2014  . Difficulty walking 10/15/2014  . Weakness of right leg 10/15/2014  . OA (osteoarthritis) of knee 09/20/2014  . Knee stiffness 09/09/2012  . S/P left TKA 08/09/2012    Aldona Lento, PTA Aldona Lento 11/10/2014, 10:25 AM

## 2014-11-11 NOTE — Addendum Note (Signed)
Encounter addended by: Leeroy Cha, PT on: 11/11/2014 12:32 PM<BR>     Documentation filed: Clinical Notes

## 2014-11-15 ENCOUNTER — Ambulatory Visit (HOSPITAL_COMMUNITY)
Admission: RE | Admit: 2014-11-15 | Discharge: 2014-11-15 | Disposition: A | Discharge status: Home / Self Care | Payer: Medicare Other | Source: Ambulatory Visit | Attending: Family Medicine | Admitting: Family Medicine

## 2014-11-15 DIAGNOSIS — R262 Difficulty in walking, not elsewhere classified: Secondary | ICD-10-CM

## 2014-11-15 DIAGNOSIS — R29898 Other symptoms and signs involving the musculoskeletal system: Secondary | ICD-10-CM

## 2014-11-15 DIAGNOSIS — M25661 Stiffness of right knee, not elsewhere classified: Secondary | ICD-10-CM

## 2014-11-15 DIAGNOSIS — Z471 Aftercare following joint replacement surgery: Secondary | ICD-10-CM | POA: Diagnosis not present

## 2014-11-15 NOTE — Therapy (Signed)
Physical Therapy Treatment  Patient Details  Name: Nicholas Lewis MRN: [id number] Date of Birth: [birth year]  Encounter Date: 11/15/2014      PT End of Session - 11/15/14 1111    Visit Number 10   Number of Visits 12   Date for PT Re-Evaluation 11/14/14   Authorization Type Medicare   Authorization - Visit Number 10   Authorization - Number of Visits 10   PT Start Time 1025   PT Stop Time 1107   PT Time Calculation (min) 42 min      Past Medical History  Diagnosis Date  . Complication of anesthesia     slow to wake up  . Hypertension   . Arthritis   . Gout   . Abrasion     l arm  . Cellulitis 07/2012    after surgery for left knee  . Cancer     skin    Past Surgical History  Procedure Laterality Date  . Knee arthroscopy  2000    left  . Cystectomy  2003    from spine  . Total knee arthroplasty  08/06/2012    Procedure: TOTAL KNEE ARTHROPLASTY;  Surgeon: Magnus Sinning, MD;  Location: [facility];  Service: Orthopedics;  Laterality: Left;  . Total knee arthroplasty Right 09/20/2014    Procedure: RIGHT TOTAL KNEE ARTHROPLASTY;  Surgeon: Gearlean Alf, MD;  Location: [facility];  Service: Orthopedics;  Laterality: Right;  . Joint replacement    . Back surgery      There were no vitals taken for this visit.  Visit Diagnosis:  Stiffness of joint, lower leg, right  Difficulty walking  Weakness of right leg      Subjective Assessment - 11/15/14 1115    Currently in Pain? No/denies            [facility] Adult PT Treatment/Exercise - 11/15/14 0001    Knee/Hip Exercises: Stretches   Active Hamstring Stretch 3 reps;30 seconds   Active Hamstring Stretch Limitations supine   Passive Hamstring Stretch 3 reps;30 seconds   Passive Hamstring Stretch Limitations on 14 " box standing    Gastroc Stretch 3 reps;30 seconds   Gastroc Stretch Limitations slant board   Knee/Hip Exercises: Standing   Heel Raises 15 reps   Knee Flexion 10 reps   Knee Flexion Limitations 5#    Terminal Knee Extension 15 reps;Theraband   Theraband Level (Terminal Knee Extension) Level 4 (Blue)   SLS with Vectors 3 x 10"   Other Standing Knee Exercises retro gt to increase extension    Other Standing Knee Exercises gt training witn heel strike and toe pushoff   Knee/Hip Exercises: Supine   Quad Sets 10 reps   Knee Extension PROM   Knee/Hip Exercises: Prone   Hamstring Curl 5 reps   Other Prone Exercises TKE x 10; PROM     Manual Therapy   Manual Therapy Myofascial release   Myofascial Release while prone to decrease adhesions and promote improved motion             PT Short Term Goals - 11/15/14 1114    PT SHORT TERM GOAL #1   Title Pt to be able to sit for 30 minutes to enjoy eating out and for more comfort with traveling   Time 2   Period Weeks   Status Achieved   PT SHORT TERM GOAL #2   Title Pt to be able to walk for 30 minutes without stopping  Time 2   Period Weeks   Status Achieved          PT Long Term Goals - 12-09-14 1115    PT LONG TERM GOAL #1   Title Pain level to be at a 2/10 or less 80% of the day   Time 4   Period Weeks   Status On-going   PT LONG TERM GOAL #2   Title strength improved one grade to allow pt to go up and down steps in a reciprocal manner   Time 4   Status Partially Met   PT LONG TERM GOAL #3   Title Pt ROM to be -5 to 120 to allow normalized walking and squatting     Time 4   Status Partially Met   PT LONG TERM GOAL #4   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  Reassess at end of the week.           G-Codes - 09-Dec-2014 1116    Functional Assessment Tool Used clinical judgement   Functional Limitation Mobility: Walking and moving around   Mobility: Walking and Moving Around Current Status [phone]) At least 20 percent but less than 40 percent  impaired, limited or restricted   Mobility: Walking and Moving Around Goal Status [phone]) At least 20 percent but less than 40 percent impaired, limited or restricted      Problem List Patient Active Problem List   Diagnosis Date Noted  . Stiffness of joint, lower leg 10/15/2014  . Difficulty walking 10/15/2014  . Weakness of right leg 10/15/2014  . OA (osteoarthritis) of knee 09/20/2014  . Knee stiffness 09/09/2012  . S/P left TKA 08/09/2012        Kyilee Gregg,CINDY PT/CLT 12-09-14, 11:19 AM

## 2014-11-17 ENCOUNTER — Ambulatory Visit (HOSPITAL_COMMUNITY)
Admission: RE | Admit: 2014-11-17 | Discharge: 2014-11-17 | Disposition: A | Discharge status: Home / Self Care | Payer: Medicare Other | Source: Ambulatory Visit | Attending: Family Medicine | Admitting: Family Medicine

## 2014-11-17 DIAGNOSIS — M25661 Stiffness of right knee, not elsewhere classified: Secondary | ICD-10-CM

## 2014-11-17 DIAGNOSIS — R262 Difficulty in walking, not elsewhere classified: Secondary | ICD-10-CM

## 2014-11-17 DIAGNOSIS — Z471 Aftercare following joint replacement surgery: Secondary | ICD-10-CM | POA: Diagnosis not present

## 2014-11-17 DIAGNOSIS — R29898 Other symptoms and signs involving the musculoskeletal system: Secondary | ICD-10-CM

## 2014-11-17 NOTE — Therapy (Signed)
Physical Therapy Treatment  Patient Details  Name: Nicholas Lewis MRN: [id number] Date of Birth: 08/05/1941  Encounter Date: 11/17/2014      PT End of Session - 11/17/14 1027    Visit Number 11   Number of Visits 12   Authorization Type Medicare   PT Start Time 1025   PT Stop Time 1101   PT Time Calculation (min) 36 min   Equipment Utilized During Treatment Gait belt   Activity Tolerance Patient tolerated treatment well      Past Medical History  Diagnosis Date  . Complication of anesthesia     slow to wake up  . Hypertension   . Arthritis   . Gout   . Abrasion     l arm  . Cellulitis 07/2012    after surgery for left knee  . Cancer     skin    Past Surgical History  Procedure Laterality Date  . Knee arthroscopy  2000    left  . Cystectomy  2003    from spine  . Total knee arthroplasty  08/06/2012    Procedure: TOTAL KNEE ARTHROPLASTY;  Surgeon: Magnus Sinning, MD;  Location: [facility];  Service: Orthopedics;  Laterality: Left;  . Total knee arthroplasty Right 09/20/2014    Procedure: RIGHT TOTAL KNEE ARTHROPLASTY;  Surgeon: Gearlean Alf, MD;  Location: [facility];  Service: Orthopedics;  Laterality: Right;  . Joint replacement    . Back surgery      There were no vitals taken for this visit.  Visit Diagnosis:  No diagnosis found.      Subjective Assessment - 11/17/14 1026    Symptoms Pt has no complaints at this time other than still concerned with not being in full extension          [facility] PT Assessment - 11/17/14 0001    AROM   Right Knee Extension -7   Right Knee Flexion 118  PROM          [facility] Adult PT Treatment/Exercise - 11/17/14 0001    Knee/Hip Exercises: Stretches   Active Hamstring Stretch 3 reps;30 seconds   Active Hamstring Stretch Limitations supine   Hip Flexor Stretch 2 reps;30 seconds   Hip Flexor Stretch Limitations on 14in step   Gastroc Stretch 3 reps;30 seconds   Gastroc Stretch Limitations slant board   Knee/Hip  Exercises: Standing   Heel Raises 15 reps   Knee Flexion Other (comment)  continue next visit   Terminal Knee Extension 15 reps;Theraband   Theraband Level (Terminal Knee Extension) Level 4 (Blue)   SLS with Vectors 3 x 10"   Other Standing Knee Exercises retro gt to increase extension    Knee/Hip Exercises: Supine   Terminal Knee Extension 15 reps   Knee Extension PROM   Knee/Hip Exercises: Prone   Other Prone Exercises Contract/Relax for ext 3x 10" holds   Manual Therapy   Manual Therapy Joint mobilization;Other (comment)  patella mobs all directions/posterior mobs GR2 for ext                Plan - 11/17/14 1527    Clinical Impression Statement Stretching and extension promoting activites main focus of treatment today. Extension with AROM improved to -7 today and PROM of flexion improved to 118. Performed patella mobs in all direction and posterior mobs to knee joint for increased extension.   PT Next Visit Plan Continue with PT SKA:[id number] with extension emphasis with treatment.  Problem List Patient Active Problem List   Diagnosis Date Noted  . Stiffness of joint, lower leg 10/15/2014  . Difficulty walking 10/15/2014  . Weakness of right leg 10/15/2014  . OA (osteoarthritis) of knee 09/20/2014  . Knee stiffness 09/09/2012  . S/P left TKA 08/09/2012                                             Larena Glassman, LPTA [phone]   Jake Seats, [place] 11/17/2014, 3:33 PM

## 2014-11-17 NOTE — Addendum Note (Signed)
Encounter addended by: Leeroy Cha, PT on: 11/17/2014  2:25 PM<BR>     Documentation filed: Clinical Notes

## 2014-11-18 NOTE — Addendum Note (Signed)
Encounter addended by: Leeroy Cha, PT on: 11/18/2014  3:53 PM<BR>     Documentation filed: Clinical Notes

## 2014-11-19 ENCOUNTER — Encounter (HOSPITAL_COMMUNITY)
Admission: RE | Admit: 2014-11-19 | Discharge: 2014-11-19 | Disposition: A | Discharge status: Home / Self Care | Payer: Medicare Other | Source: Ambulatory Visit | Attending: Family Medicine | Admitting: Family Medicine

## 2014-11-19 ENCOUNTER — Encounter (HOSPITAL_COMMUNITY): Payer: Self-pay

## 2014-11-19 DIAGNOSIS — E041 Nontoxic single thyroid nodule: Secondary | ICD-10-CM | POA: Diagnosis not present

## 2014-11-19 MED ORDER — SODIUM PERTECHNETATE TC 99M INJECTION
10.0000 | Freq: Once | INTRAVENOUS | Status: AC | PRN
  Administered 2014-11-19: 10 via INTRAVENOUS

## 2014-11-23 ENCOUNTER — Other Ambulatory Visit (INDEPENDENT_AMBULATORY_CARE_PROVIDER_SITE_OTHER): Payer: Self-pay | Admitting: General Surgery

## 2014-11-23 ENCOUNTER — Other Ambulatory Visit (INDEPENDENT_AMBULATORY_CARE_PROVIDER_SITE_OTHER): Payer: Self-pay

## 2014-11-23 DIAGNOSIS — I10 Essential (primary) hypertension: Secondary | ICD-10-CM | POA: Diagnosis not present

## 2014-11-23 DIAGNOSIS — E041 Nontoxic single thyroid nodule: Secondary | ICD-10-CM | POA: Diagnosis not present

## 2014-11-23 DIAGNOSIS — M109 Gout, unspecified: Secondary | ICD-10-CM | POA: Diagnosis not present

## 2014-12-02 ENCOUNTER — Ambulatory Visit (HOSPITAL_COMMUNITY)
Admission: RE | Admit: 2014-12-02 | Discharge: 2014-12-02 | Disposition: A | Discharge status: Home / Self Care | Payer: Medicare Other | Source: Ambulatory Visit | Attending: Family Medicine | Admitting: Family Medicine

## 2014-12-02 DIAGNOSIS — Z96651 Presence of right artificial knee joint: Secondary | ICD-10-CM | POA: Insufficient documentation

## 2014-12-02 DIAGNOSIS — R29898 Other symptoms and signs involving the musculoskeletal system: Secondary | ICD-10-CM

## 2014-12-02 DIAGNOSIS — M25561 Pain in right knee: Secondary | ICD-10-CM | POA: Diagnosis not present

## 2014-12-02 DIAGNOSIS — M25661 Stiffness of right knee, not elsewhere classified: Secondary | ICD-10-CM | POA: Insufficient documentation

## 2014-12-02 DIAGNOSIS — M6281 Muscle weakness (generalized): Secondary | ICD-10-CM | POA: Insufficient documentation

## 2014-12-02 DIAGNOSIS — Z471 Aftercare following joint replacement surgery: Secondary | ICD-10-CM | POA: Insufficient documentation

## 2014-12-02 DIAGNOSIS — R262 Difficulty in walking, not elsewhere classified: Secondary | ICD-10-CM | POA: Diagnosis not present

## 2014-12-02 NOTE — Addendum Note (Signed)
Encounter addended by: Leeroy Cha, PT on: 12/02/2014  4:39 PM<BR>     Documentation filed: Episodes, Clinical Notes

## 2014-12-02 NOTE — Patient Instructions (Addendum)
Strengthening: Quadriceps Set   Tighten muscles on top of thighs by pushing knees down into surface. Hold ____ seconds. Repeat ____ times per set. Do ____ sets per session. Do ____ sessions per day.  http://orth.exer.us/602   Copyright  VHI. All rights reserved.  Stretching: Hamstring (Supine)   Supporting right thigh behind knee, slowly straighten knee until stretch is felt in back of thigh. Hold ____ seconds. Repeat ____ times per set. Do ____ sets per session. Do ____ sessions per day.  http://orth.exer.us/656   Copyright  VHI. All rights reserved.  Knee Extension Mobilization: Hang (Prone)   With table supporting thighs, place ____ pound weight on right ankle. Hold ____ minutes. Repeat ____ times per set. Do ____ sets per session. Do ____ sessions per day.  http://orth.exer.us/722  Copyright  VHI. All rights reserved.  Knee Extension Mobilization: Towel Prop   With rolled towel under right ankle, place ____ pound weight across knee. Hold ____ minutes. Repeat ____ times per set. Do ____ sets per session. Do ____ sessions per day.  http://orth.exer.us/720   Copyright  VHI. All rights reserved.  Balance: Unilateral   Attempt to balance on left leg, eyes open. Hold ____ seconds. Repeat ____ times per set. Do ____ sets per session. Do ____ sessions per day. Perform exercise with eyes closed.  http://orth.exer.us/28   Copyright  VHI. All rights reserved.  Gastroc Stretch   Stand with right foot back, leg straight, forward leg bent. Keeping heel on floor, turned slightly out, lean into wall until stretch is felt in calf. Hold ____ seconds. Repeat ____ times per set. Do ____ sets per session. Do ____ sessions per day.  http://orth.exer.us/26   Copyright  VHI. All rights reserved.

## 2014-12-02 NOTE — Therapy (Signed)
[facility] [address] [place], [place], [address] Phone: [phone]   Fax:  [phone]  Physical Therapy Evaluation  Patient Details  Name: Nicholas Lewis MRN: [id number] Date of Birth: [birth year]  Encounter Date: 12/02/2014 PHYSICAL THERAPY DISCHARGE SUMMARY  Plan: Patient agrees to discharge.  Patient goals were partially met. Patient is being discharged due to meeting the stated rehab goals.  ?????          PT End of Session - 12/02/14 1631    Visit Number 12   Number of Visits 12   Authorization Type Medicare   Authorization - Visit Number 12   Authorization - Number of Visits 12   PT Start Time 5027   PT Stop Time 7412   PT Time Calculation (min) 47 min   Activity Tolerance Patient tolerated treatment well      Past Medical History  Diagnosis Date  . Complication of anesthesia     slow to wake up  . Hypertension   . Arthritis   . Gout   . Abrasion     l arm  . Cellulitis 07/2012    after surgery for left knee  . Cancer     skin    Past Surgical History  Procedure Laterality Date  . Knee arthroscopy  2000    left  . Cystectomy  2003    from spine  . Total knee arthroplasty  08/06/2012    Procedure: TOTAL KNEE ARTHROPLASTY;  Surgeon: Magnus Sinning, MD;  Location: [facility];  Service: Orthopedics;  Laterality: Left;  . Total knee arthroplasty Right 09/20/2014    Procedure: RIGHT TOTAL KNEE ARTHROPLASTY;  Surgeon: Gearlean Alf, MD;  Location: [facility];  Service: Orthopedics;  Laterality: Right;  . Joint replacement    . Back surgery      There were no vitals taken for this visit.  Visit Diagnosis:  Stiffness of joint, lower leg, right  Difficulty walking  Weakness of right leg      Subjective Assessment - 12/02/14 1316    Symptoms Pt states that going down steps he still has a slight amount of pain.  He went to the MD and he feels this will be taken care of with time.    How long can you sit comfortably?  Pt is able to sit as long as he needs to was 30 minutes.    How long can you stand comfortably? Pt is able to stand for an hour now was 25 minutes.    How long can you walk comfortably? 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    Currently in Pain? No/denies          [facility] PT Assessment - 12/02/14 1312    Assessment   Medical Diagnosis Rt TKR   Next MD Visit Alusio 11/30/2014   Prior Therapy HH   AROM   Right Knee Extension -5   Right Knee Flexion 120   Strength   Right Hip Flexion 5/5   Right Hip ABduction 5/5   Right Knee Flexion 5/5  was 3+/5   Right Knee Extension 5/5   Right Ankle Dorsiflexion 5/5          [facility] Adult PT Treatment/Exercise - 12/02/14 0001    Knee/Hip Exercises: Stretches   Active Hamstring Stretch 2 reps;30 seconds   Gastroc Stretch 3 reps;30 seconds   Knee/Hip Exercises:  Supine   Quad Sets Right;2 sets;10 reps   Terminal Knee Extension 10 reps   Knee Extension PROM   Knee/Hip Exercises: Prone   Other Prone Exercises TKE x 10;  prone knee hang x 3'            PT Short Term Goals - 12/07/14 1336    PT SHORT TERM GOAL #1   Title Pt to be able to sit for 30 minutes to enjoy eating out and for more comfort with traveling   Time 2   Status Achieved   PT SHORT TERM GOAL #2   Title Pt to be able to walk for 30 minutes without stopping     Time 2   Period Weeks   Status Achieved          PT Long Term Goals - 12-07-14 1337    PT LONG TERM GOAL #1   Title Pain level to be at a 2/10 or less 80% of the day   Time 4   Period Weeks   Status On-going   PT LONG TERM GOAL #2   Title strength improved one grade to allow pt to go up and down steps in a reciprocal manner   Time 4   Period Weeks   Status Achieved   PT LONG TERM GOAL #3   Title Pt ROM to be -5 to 120 to allow normalized walking and squatting     Time 4   Status Partially Met   PT LONG TERM GOAL #4   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  Pt still has deficits in extension but should be able to worlk on this on his own now.  Pt goals have all been met we will discharge pt today.    PT Next Visit Plan discharge   PT Home Exercise Plan new given           G-Codes - 12/07/2014 1634    Functional Assessment Tool Used foto   Functional Limitation Mobility: Walking and moving around   Mobility: Walking and Moving Around Goal Status [phone]) At least 20 percent but less than 40 percent impaired, limited or restricted   Mobility: Walking and Moving Around Discharge Status [phone]) At least 20 percent but less than 40 percent impaired, limited or restricted       Problem List Patient Active Problem List   Diagnosis Date Noted  . Stiffness of joint, lower leg 10/15/2014  . Difficulty walking 10/15/2014  . Weakness of right leg 10/15/2014  . OA (osteoarthritis) of knee 09/20/2014  . Knee stiffness 09/09/2012  . S/P left TKA 08/09/2012   Azucena Freed PT/CLT [phone]  07-Dec-2014, 4:35 PM

## 2014-12-03 DIAGNOSIS — E041 Nontoxic single thyroid nodule: Secondary | ICD-10-CM | POA: Diagnosis not present

## 2014-12-29 ENCOUNTER — Ambulatory Visit (HOSPITAL_BASED_OUTPATIENT_CLINIC_OR_DEPARTMENT_OTHER): Payer: Self-pay | Admitting: Surgery

## 2014-12-29 DIAGNOSIS — D440 Neoplasm of uncertain behavior of thyroid gland: Secondary | ICD-10-CM | POA: Diagnosis not present

## 2015-01-14 DIAGNOSIS — E6609 Other obesity due to excess calories: Secondary | ICD-10-CM | POA: Diagnosis not present

## 2015-01-14 DIAGNOSIS — Z6831 Body mass index (BMI) 31.0-31.9, adult: Secondary | ICD-10-CM | POA: Diagnosis not present

## 2015-01-14 DIAGNOSIS — W548XXA Other contact with dog, initial encounter: Secondary | ICD-10-CM

## 2015-01-14 DIAGNOSIS — S00511A Abrasion of lip, initial encounter: Secondary | ICD-10-CM | POA: Diagnosis not present

## 2015-01-14 HISTORY — DX: Other contact with dog, initial encounter: W54.8XXA

## 2015-01-17 NOTE — Patient Instructions (Addendum)
Nicholas Lewis [facility]  01/17/2015   Your procedure is scheduled on: 01/20/2015    Report to [facility] Main  Entrance and follow signs to               [place] at     1230pm  Call this number if you have problems the morning of surgery [phone]   Remember:  Do not eat food after midnite.  May have clear liquids until 0800am then nothing by mouth.     CLEAR LIQUID DIET   Foods Allowed                                                                     Foods Excluded  Coffee and tea, regular and decaf                             liquids that you cannot  Plain Jell-O in any flavor                                             see through such as: Fruit ices (not with fruit pulp)                                     milk, soups, orange juice  Iced Popsicles                                    All solid food Carbonated beverages, regular and diet                                    Cranberry, grape and apple juices Sports drinks like Gatorade Lightly seasoned clear broth or consume(fat free) Sugar, honey syrup  Sample Menu Breakfast                                Lunch                                     Supper Cranberry juice                    Beef broth                            Chicken broth Jell-O                                     Grape juice  Apple juice Coffee or tea                        Jell-O                                      Popsicle                                                Coffee or tea                        Coffee or tea  _____________________________________________________________________       Take these medicines the morning of surgery with A SIP OF WATER: allopurinol, Metoprolol ( Lopressor )                                You may not have any metal on your body including hair pins and              piercings  Do not wear jewelry, , lotions, powders or perfumes.                          Men may  shave face and neck.   Do not bring valuables to the hospital. [place].  Contacts, dentures or bridgework may not be worn into surgery.  Leave suitcase in the car. After surgery it may be brought to your room.         Special Instructions : coughing and deep breathing exercises, leg exercises               Please read over the following fact sheets you were given: _____________________________________________________________________             [facility] - Preparing for Surgery Before surgery, you can play an important role.  Because skin is not sterile, your skin needs to be as free of germs as possible.  You can reduce the number of germs on your skin by washing with CHG (chlorahexidine gluconate) soap before surgery.  CHG is an antiseptic cleaner which kills germs and bonds with the skin to continue killing germs even after washing. Please DO NOT use if you have an allergy to CHG or antibacterial soaps.  If your skin becomes reddened/irritated stop using the CHG and inform your nurse when you arrive at Short Stay. Do not shave (including legs and underarms) for at least 48 hours prior to the first CHG shower.  You may shave your face/neck. Please follow these instructions carefully:  1.  Shower with CHG Soap the night before surgery and the  morning of Surgery.  2.  If you choose to wash your hair, wash your hair first as usual with your  normal  shampoo.  3.  After you shampoo, rinse your hair and body thoroughly to remove the  shampoo.                           4.  Use CHG  as you would any other liquid soap.  You can apply chg directly  to the skin and wash                       Gently with a scrungie or clean washcloth.  5.  Apply the CHG Soap to your body ONLY FROM THE NECK DOWN.   Do not use on face/ open                           Wound or open sores. Avoid contact with eyes, ears mouth and genitals (private parts).                        Wash face,  Genitals (private parts) with your normal soap.             6.  Wash thoroughly, paying special attention to the area where your surgery  will be performed.  7.  Thoroughly rinse your body with warm water from the neck down.  8.  DO NOT shower/wash with your normal soap after using and rinsing off  the CHG Soap.                9.  Pat yourself dry with a clean towel.            10.  Wear clean pajamas.            11.  Place clean sheets on your bed the night of your first shower and do not  sleep with pets. Day of Surgery : Do not apply any lotions/deodorants the morning of surgery.  Please wear clean clothes to the hospital/surgery center.  FAILURE TO FOLLOW THESE INSTRUCTIONS MAY RESULT IN THE CANCELLATION OF YOUR SURGERY PATIENT SIGNATURE_________________________________  NURSE SIGNATURE__________________________________  ________________________________________________________________________

## 2015-01-18 ENCOUNTER — Encounter (HOSPITAL_COMMUNITY): Payer: Self-pay

## 2015-01-18 ENCOUNTER — Encounter (HOSPITAL_COMMUNITY): Payer: Self-pay | Admitting: *Deleted

## 2015-01-18 ENCOUNTER — Encounter (HOSPITAL_COMMUNITY)
Admission: RE | Admit: 2015-01-18 | Discharge: 2015-01-18 | Disposition: A | Discharge status: Home / Self Care | Payer: Medicare Other | Source: Ambulatory Visit | Attending: Surgery | Admitting: Surgery

## 2015-01-18 DIAGNOSIS — Z882 Allergy status to sulfonamides status: Secondary | ICD-10-CM | POA: Diagnosis not present

## 2015-01-18 DIAGNOSIS — D34 Benign neoplasm of thyroid gland: Secondary | ICD-10-CM | POA: Diagnosis not present

## 2015-01-18 DIAGNOSIS — D440 Neoplasm of uncertain behavior of thyroid gland: Secondary | ICD-10-CM | POA: Diagnosis present

## 2015-01-18 DIAGNOSIS — Z885 Allergy status to narcotic agent status: Secondary | ICD-10-CM | POA: Diagnosis not present

## 2015-01-18 DIAGNOSIS — I10 Essential (primary) hypertension: Secondary | ICD-10-CM | POA: Diagnosis not present

## 2015-01-18 HISTORY — DX: Other contact with dog, initial encounter: W54.8XXA

## 2015-01-18 LAB — CBC
HCT: 42.5 % (ref 39.0–52.0)
Hemoglobin: 14.7 g/dL (ref 13.0–17.0)
MCH: 30.8 pg (ref 26.0–34.0)
MCHC: 34.6 g/dL (ref 30.0–36.0)
MCV: 88.9 fL (ref 78.0–100.0)
Platelets: 163 10*3/uL (ref 150–400)
RBC: 4.78 MIL/uL (ref 4.22–5.81)
RDW: 13.7 % (ref 11.5–15.5)
WBC: 7.2 10*3/uL (ref 4.0–10.5)

## 2015-01-18 LAB — BASIC METABOLIC PANEL
Anion gap: 6 (ref 5–15)
BUN: 18 mg/dL (ref 6–23)
CO2: 31 mmol/L (ref 19–32)
Calcium: 9.3 mg/dL (ref 8.4–10.5)
Chloride: 105 mEq/L (ref 96–112)
Creatinine, Ser: 0.87 mg/dL (ref 0.50–1.35)
GFR calc Af Amer: >90 mL/min (ref >90)
GFR calc non Af Amer: 84 mL/min — ABNORMAL LOW (ref >90)
Glucose, Bld: 110 mg/dL — ABNORMAL HIGH (ref 70–99)
Potassium: 4.4 mmol/L (ref 3.5–5.1)
Sodium: 142 mmol/L (ref 135–145)

## 2015-01-18 NOTE — Progress Notes (Signed)
EKG- 10/05/14 EPIC  CXR- 10/04/14 EPIC

## 2015-01-18 NOTE — Progress Notes (Signed)
Dr Landry Dyke ( anesthesia ) made aware of right knee replacement in 08/2014, pt in ED in 09/2014 with non radiating chest pain.  Labs, Chest Ct done.  Troponins negative.  Chest Ct negative for PE.  EKG and CXR done.  Patient denies any chest pain or shortness of breath at preop visit.  No new orders given.  ED note from 09/2014 along with EKG on chart.

## 2015-01-18 NOTE — Progress Notes (Signed)
Patient in ED on 10/04/2014 for non radiating pain in center of ribs.  EKG- normal per note , CXR and CT of chest normal along with normal labs.  ( See note on chart , also note located in EPIC along with labs, etc. ) .

## 2015-01-18 NOTE — Progress Notes (Addendum)
Called [facility] and spoke with Burney at time of preop appointment while patient was here and informed them of lower lip dog scratch from toe nail.  Burney to let Dr Harlow Asa be aware.  Patient placed on Augmentin on 01/14/2015 and patient seen by PCP.  Called for office visit note from PCP and will forward to [facility] once arrived.  Also informed [facility] aware patient's last dose of Aspirin on 01/14/2015.  Office visit note received from Dr Hilma Favors of 01/14/2015 and faxed to [place].  Note on chart.

## 2015-01-19 NOTE — Anesthesia Preprocedure Evaluation (Addendum)
Anesthesia Evaluation  Patient identified by MRN, date of birth, ID band Patient awake    Reviewed: Allergy & Precautions, NPO status , Patient's Chart, lab work & pertinent test results, reviewed documented beta blocker date and time   History of Anesthesia Complications History of anesthetic complications: slow to awaken.  Airway Mallampati: II   Neck ROM: Full    Dental  (+) Teeth Intact, Dental Advisory Given   Pulmonary  breath sounds clear to auscultation        Cardiovascular hypertension, Pt. on medications Rhythm:Regular     Neuro/Psych negative neurological ROS     GI/Hepatic negative GI ROS, Neg liver ROS,   Endo/Other  negative endocrine ROS  Renal/GU negative Renal ROS     Musculoskeletal   Abdominal (+)  Abdomen: soft.    Peds  Hematology negative hematology ROS (+)   Anesthesia Other Findings   Reproductive/Obstetrics                           Anesthesia Physical Anesthesia Plan  ASA: II  Anesthesia Plan: General   Post-op Pain Management:    Induction: Intravenous  Airway Management Planned: Oral ETT  Additional Equipment:   Intra-op Plan:   Post-operative Plan:   Informed Consent: I have reviewed the patients History and Physical, chart, labs and discussed the procedure including the risks, benefits and alternatives for the proposed anesthesia with the patient or authorized representative who has indicated his/her understanding and acceptance.     Plan Discussed with:   Anesthesia Plan Comments:         Anesthesia Quick Evaluation

## 2015-01-19 NOTE — Progress Notes (Signed)
Quick Note:  These results are acceptable for scheduled surgery.  Charlet Harr M. Trula Frede, MD, FACS [facility] Office: [phone]   ______ 

## 2015-01-20 ENCOUNTER — Encounter (HOSPITAL_COMMUNITY)
Admission: RE | Disposition: A | Discharge status: Home / Self Care | Payer: Self-pay | Source: Ambulatory Visit | Attending: Surgery

## 2015-01-20 ENCOUNTER — Encounter (HOSPITAL_COMMUNITY): Payer: Self-pay | Admitting: Surgery

## 2015-01-20 ENCOUNTER — Observation Stay (HOSPITAL_COMMUNITY)
Admission: RE | Admit: 2015-01-20 | Discharge: 2015-01-21 | Disposition: A | Discharge status: Home / Self Care | Payer: Medicare Other | Source: Ambulatory Visit | Attending: Surgery | Admitting: Surgery

## 2015-01-20 ENCOUNTER — Ambulatory Visit (HOSPITAL_COMMUNITY): Payer: Medicare Other | Admitting: Anesthesiology

## 2015-01-20 DIAGNOSIS — M1990 Unspecified osteoarthritis, unspecified site: Secondary | ICD-10-CM | POA: Diagnosis not present

## 2015-01-20 DIAGNOSIS — Z882 Allergy status to sulfonamides status: Secondary | ICD-10-CM | POA: Insufficient documentation

## 2015-01-20 DIAGNOSIS — I10 Essential (primary) hypertension: Secondary | ICD-10-CM | POA: Diagnosis not present

## 2015-01-20 DIAGNOSIS — D497 Neoplasm of unspecified behavior of endocrine glands and other parts of nervous system: Secondary | ICD-10-CM | POA: Diagnosis not present

## 2015-01-20 DIAGNOSIS — Z885 Allergy status to narcotic agent status: Secondary | ICD-10-CM | POA: Diagnosis not present

## 2015-01-20 DIAGNOSIS — D34 Benign neoplasm of thyroid gland: Secondary | ICD-10-CM | POA: Diagnosis not present

## 2015-01-20 DIAGNOSIS — D440 Neoplasm of uncertain behavior of thyroid gland: Secondary | ICD-10-CM | POA: Diagnosis present

## 2015-01-20 HISTORY — PX: THYROID LOBECTOMY: SHX420

## 2015-01-20 HISTORY — DX: Pneumonia, unspecified organism: J18.9

## 2015-01-20 SURGERY — LOBECTOMY, THYROID
Anesthesia: General | Site: Neck | Laterality: Left

## 2015-01-20 MED ORDER — HYDROCHLOROTHIAZIDE 25 MG PO TABS
25.0000 mg | ORAL_TABLET | Freq: Every morning | ORAL | Status: DC
  Administered 2015-01-21: 25 mg via ORAL
  Filled 2015-01-20: qty 1

## 2015-01-20 MED ORDER — CEFAZOLIN SODIUM-DEXTROSE 2-3 GM-% IV SOLR
INTRAVENOUS | Status: AC
  Filled 2015-01-20: qty 50

## 2015-01-20 MED ORDER — LIDOCAINE HCL (CARDIAC) 20 MG/ML IV SOLN
INTRAVENOUS | Status: AC
  Filled 2015-01-20: qty 5

## 2015-01-20 MED ORDER — LIDOCAINE HCL (CARDIAC) 20 MG/ML IV SOLN
INTRAVENOUS | Status: DC | PRN
  Administered 2015-01-20: 100 mg via INTRAVENOUS

## 2015-01-20 MED ORDER — FENTANYL CITRATE 0.05 MG/ML IJ SOLN
INTRAMUSCULAR | Status: AC
  Filled 2015-01-20: qty 5

## 2015-01-20 MED ORDER — GLYCOPYRROLATE 0.2 MG/ML IJ SOLN
INTRAMUSCULAR | Status: DC | PRN
Start: 2015-01-20 — End: 2015-01-20
  Administered 2015-01-20: 0.4 mg via INTRAVENOUS

## 2015-01-20 MED ORDER — FENTANYL CITRATE 0.05 MG/ML IJ SOLN
INTRAMUSCULAR | Status: AC
  Filled 2015-01-20: qty 2

## 2015-01-20 MED ORDER — MIDAZOLAM HCL 5 MG/5ML IJ SOLN
INTRAMUSCULAR | Status: DC | PRN
  Administered 2015-01-20: 1 mg via INTRAVENOUS

## 2015-01-20 MED ORDER — DEXAMETHASONE SODIUM PHOSPHATE 10 MG/ML IJ SOLN
INTRAMUSCULAR | Status: AC
  Filled 2015-01-20: qty 1

## 2015-01-20 MED ORDER — CEFAZOLIN SODIUM-DEXTROSE 2-3 GM-% IV SOLR
2.0000 g | INTRAVENOUS | Status: AC
  Administered 2015-01-20: 2 g via INTRAVENOUS

## 2015-01-20 MED ORDER — HYDROMORPHONE HCL 1 MG/ML IJ SOLN
1.0000 mg | INTRAMUSCULAR | Status: DC | PRN
  Filled 2015-01-20: qty 1

## 2015-01-20 MED ORDER — PROPOFOL 10 MG/ML IV BOLUS
INTRAVENOUS | Status: AC
  Filled 2015-01-20: qty 20

## 2015-01-20 MED ORDER — AMOXICILLIN-POT CLAVULANATE 875-125 MG PO TABS
1.0000 | ORAL_TABLET | Freq: Two times a day (BID) | ORAL | Status: DC
  Administered 2015-01-20 – 2015-01-21 (×2): 1 via ORAL
  Filled 2015-01-20 (×3): qty 1

## 2015-01-20 MED ORDER — HYDROMORPHONE HCL 2 MG PO TABS: 2.0000 mg | ORAL_TABLET | ORAL | Status: DC | PRN

## 2015-01-20 MED ORDER — MEPERIDINE HCL 50 MG/ML IJ SOLN: 6.2500 mg | INTRAMUSCULAR | Status: DC | PRN

## 2015-01-20 MED ORDER — METOPROLOL TARTRATE 50 MG PO TABS
50.0000 mg | ORAL_TABLET | Freq: Two times a day (BID) | ORAL | Status: DC
  Administered 2015-01-20 – 2015-01-21 (×2): 50 mg via ORAL
  Filled 2015-01-20 (×3): qty 1

## 2015-01-20 MED ORDER — MIDAZOLAM HCL 2 MG/2ML IJ SOLN
INTRAMUSCULAR | Status: AC
  Filled 2015-01-20: qty 2

## 2015-01-20 MED ORDER — ONDANSETRON HCL 4 MG PO TABS: 4.0000 mg | ORAL_TABLET | Freq: Four times a day (QID) | ORAL | Status: DC | PRN

## 2015-01-20 MED ORDER — NEOSTIGMINE METHYLSULFATE 10 MG/10ML IV SOLN
INTRAVENOUS | Status: DC | PRN
  Administered 2015-01-20: 3 mg via INTRAVENOUS

## 2015-01-20 MED ORDER — FENTANYL CITRATE 0.05 MG/ML IJ SOLN
INTRAMUSCULAR | Status: DC | PRN
  Administered 2015-01-20 (×2): 25 ug via INTRAVENOUS
  Administered 2015-01-20 (×2): 50 ug via INTRAVENOUS

## 2015-01-20 MED ORDER — FENTANYL CITRATE 0.05 MG/ML IJ SOLN
25.0000 ug | INTRAMUSCULAR | Status: DC | PRN
  Administered 2015-01-20 (×2): 50 ug via INTRAVENOUS

## 2015-01-20 MED ORDER — ONDANSETRON HCL 4 MG/2ML IJ SOLN
INTRAMUSCULAR | Status: AC
  Filled 2015-01-20: qty 2

## 2015-01-20 MED ORDER — ROCURONIUM BROMIDE 100 MG/10ML IV SOLN
INTRAVENOUS | Status: DC | PRN
  Administered 2015-01-20: 30 mg via INTRAVENOUS

## 2015-01-20 MED ORDER — ONDANSETRON HCL 4 MG/2ML IJ SOLN
INTRAMUSCULAR | Status: DC | PRN
  Administered 2015-01-20: 4 mg via INTRAVENOUS

## 2015-01-20 MED ORDER — KCL IN DEXTROSE-NACL 20-5-0.45 MEQ/L-%-% IV SOLN
INTRAVENOUS | Status: DC
  Filled 2015-01-20 (×2): qty 1000

## 2015-01-20 MED ORDER — PROPOFOL 10 MG/ML IV BOLUS
INTRAVENOUS | Status: DC | PRN
Start: 2015-01-20 — End: 2015-01-20
  Administered 2015-01-20: 150 mg via INTRAVENOUS

## 2015-01-20 MED ORDER — GLYCOPYRROLATE 0.2 MG/ML IJ SOLN
INTRAMUSCULAR | Status: AC
  Filled 2015-01-20: qty 4

## 2015-01-20 MED ORDER — 0.9 % SODIUM CHLORIDE (POUR BTL) OPTIME
TOPICAL | Status: DC | PRN
Start: 2015-01-20 — End: 2015-01-20
  Administered 2015-01-20: 1000 mL

## 2015-01-20 MED ORDER — ONDANSETRON HCL 4 MG/2ML IJ SOLN
4.0000 mg | Freq: Four times a day (QID) | INTRAMUSCULAR | Status: DC | PRN
Start: 2015-01-20 — End: 2015-01-21

## 2015-01-20 MED ORDER — ACETAMINOPHEN 10 MG/ML IV SOLN
1000.0000 mg | Freq: Once | INTRAVENOUS | Status: AC
  Administered 2015-01-20: 1000 mg via INTRAVENOUS
  Filled 2015-01-20: qty 100

## 2015-01-20 MED ORDER — DEXAMETHASONE SODIUM PHOSPHATE 10 MG/ML IJ SOLN
INTRAMUSCULAR | Status: DC | PRN
  Administered 2015-01-20: 10 mg via INTRAVENOUS

## 2015-01-20 MED ORDER — ACETAMINOPHEN 325 MG PO TABS
650.0000 mg | ORAL_TABLET | ORAL | Status: DC | PRN
  Administered 2015-01-20: 650 mg via ORAL
  Filled 2015-01-20: qty 2

## 2015-01-20 MED ORDER — LACTATED RINGERS IV SOLN
INTRAVENOUS | Status: DC | PRN
  Administered 2015-01-20 (×2): via INTRAVENOUS

## 2015-01-20 MED ORDER — PROMETHAZINE HCL 25 MG/ML IJ SOLN
6.2500 mg | INTRAMUSCULAR | Status: DC | PRN
Start: 2015-01-20 — End: 2015-01-20

## 2015-01-20 MED ORDER — ALLOPURINOL 300 MG PO TABS
300.0000 mg | ORAL_TABLET | Freq: Every morning | ORAL | Status: DC
  Administered 2015-01-21: 300 mg via ORAL
  Filled 2015-01-20: qty 1

## 2015-01-20 SURGICAL SUPPLY — 41 items
ADH SKN CLS APL DERMABOND .7 (GAUZE/BANDAGES/DRESSINGS) ×1
APL SKNCLS STERI-STRIP NONHPOA (GAUZE/BANDAGES/DRESSINGS)
ATTRACTOMAT 16X20 MAGNETIC DRP (DRAPES) ×3 IMPLANT
BENZOIN TINCTURE PRP APPL 2/3 (GAUZE/BANDAGES/DRESSINGS) IMPLANT
BLADE HEX COATED 2.75 (ELECTRODE) ×3 IMPLANT
BLADE SURG 15 STRL LF DISP TIS (BLADE) ×1 IMPLANT
BLADE SURG 15 STRL SS (BLADE) ×3
CHLORAPREP W/TINT 10.5 ML (MISCELLANEOUS) ×3 IMPLANT
CLIP TI MEDIUM 6 (CLIP) ×6 IMPLANT
CLIP TI WIDE RED SMALL 6 (CLIP) ×6 IMPLANT
CLOSURE WOUND 1/2 X4 (GAUZE/BANDAGES/DRESSINGS)
DERMABOND ADVANCED (GAUZE/BANDAGES/DRESSINGS) ×2
DERMABOND ADVANCED .7 DNX12 (GAUZE/BANDAGES/DRESSINGS) ×1 IMPLANT
DISSECTOR ROUND CHERRY 3/8 STR (MISCELLANEOUS) IMPLANT
DRAPE LAPAROTOMY T 98X78 PEDS (DRAPES) ×3 IMPLANT
DRAPE UTILITY XL STRL (DRAPES) ×3 IMPLANT
DRESSING SURGICEL FIBRLLR 1X2 (HEMOSTASIS) ×1 IMPLANT
DRSG SURGICEL FIBRILLAR 1X2 (HEMOSTASIS) ×3
ELECT REM PT RETURN 9FT ADLT (ELECTROSURGICAL) ×3
ELECTRODE REM PT RTRN 9FT ADLT (ELECTROSURGICAL) ×1 IMPLANT
GAUZE SPONGE 4X4 12PLY STRL (GAUZE/BANDAGES/DRESSINGS) IMPLANT
GAUZE SPONGE 4X4 16PLY XRAY LF (GAUZE/BANDAGES/DRESSINGS) ×3 IMPLANT
GLOVE SURG ORTHO 8.0 STRL STRW (GLOVE) ×3 IMPLANT
GOWN STRL REUS W/TWL LRG LVL3 (GOWN DISPOSABLE) ×3 IMPLANT
GOWN STRL REUS W/TWL XL LVL3 (GOWN DISPOSABLE) ×6 IMPLANT
KIT BASIN OR (CUSTOM PROCEDURE TRAY) ×3 IMPLANT
NS IRRIG 1000ML POUR BTL (IV SOLUTION) ×3 IMPLANT
PACK BASIC VI WITH GOWN DISP (CUSTOM PROCEDURE TRAY) ×3 IMPLANT
PENCIL BUTTON HOLSTER BLD 10FT (ELECTRODE) ×3 IMPLANT
SHEARS HARMONIC 9CM CVD (BLADE) ×3 IMPLANT
STAPLER VISISTAT 35W (STAPLE) ×3 IMPLANT
STRIP CLOSURE SKIN 1/2X4 (GAUZE/BANDAGES/DRESSINGS) IMPLANT
SUT MNCRL AB 4-0 PS2 18 (SUTURE) ×3 IMPLANT
SUT SILK 2 0 (SUTURE) ×3
SUT SILK 2-0 18XBRD TIE 12 (SUTURE) ×1 IMPLANT
SUT SILK 3 0 (SUTURE)
SUT SILK 3-0 18XBRD TIE 12 (SUTURE) IMPLANT
SUT VIC AB 3-0 SH 18 (SUTURE) ×3 IMPLANT
SYR BULB IRRIGATION 50ML (SYRINGE) ×3 IMPLANT
TOWEL OR 17X26 10 PK STRL BLUE (TOWEL DISPOSABLE) ×3 IMPLANT
YANKAUER SUCT BULB TIP 10FT TU (MISCELLANEOUS) ×3 IMPLANT

## 2015-01-20 NOTE — H&P (Signed)
General Surgery [facility]  Fenton Malling. Ganus 12/29/2014 4:08 PM Location: [facility] Patient #: [id number] DOB: [birth year] Married / Language: English / Race: White Male  History of Present Illness Earnstine Regal MD; 12/29/2014 5:21 PM) Patient words: Would like to speak with Dr Harlow Asa about thyroid.  The patient is a 74 year old male who presents with a thyroid nodule. Patient is referred by Dr. Bud Face for evaluation of left thyroid nodule. Patient was previously evaluated for this complaint by my partner, Dr. Fanny Skates. Patient has a dominant 4.6 cm complex nodule in the left thyroid lobe. This was found incidentally on CT scanning. Patient underwent ultrasound-guided fine-needle aspiration biopsy which shows a follicular lesion, Bethesda class III, with mild cytologic atypia. There are no nodules present in the right thyroid lobe. Patient has no symptoms. He denies tremors or palpitations. He has never been on thyroid medication. He has had no prior head or neck surgery. There is no family history of thyroid disease and no family history of other endocrine neoplasm. Patient and his wife state that they were originally referred to my practice for evaluation and were inadvertently evaluated by Dr. Dalbert Batman. They are happy with his evaluation and recommendations, but wanted a second opinion from me and prefer that if we are to proceed with surgery, that I be the operating surgeon. I will discuss this with Dr. Dalbert Batman as well.   Allergies Festus Holts, LPN; 55/73/2202 5:42 PM) Percocet *ANALGESICS - OPIOID* SulfADIAZINE Sodium *Sulfonamides** Mobic *ANALGESICS - ANTI-INFLAMMATORY*  Medication History Festus Holts, LPN; 70/62/3762 8:31 PM) Allopurinol (300MG  Tablet, Oral) Active. Hydrochlorothiazide (25MG  Tablet, Oral) Active. Robaxin (500MG  Tablet, Oral) Active. Lopressor (100MG  Tablet, Oral) Active. Toprol XL (50MG  Tablet ER  24HR, Oral) Active. Multivitamins (Oral) Active. Omeprazole (20MG  Tablet DR, Oral) Active. Saw Palmetto Berries (540MG  Capsule, Oral) Active. TraMADol HCl (50MG  Tablet, Oral) Active.  Vitals Festus Holts LPN; 51/76/1607 3:71 PM) 12/29/2014 4:12 PM Weight: 216 lb Height: 70in Body Surface Area: 2.2 m Body Mass Index: 30.99 kg/m Temp.: 98.64F(Temporal)  Pulse: 68 (Regular)  Resp.: 18 (Unlabored)  BP: 140/80 (Sitting, Left Arm, Standard)    Physical Exam Earnstine Regal MD; 12/29/2014 5:22 PM) The physical exam findings are as follows: Note:General - appears comfortable, no distress; not diaphorectic  HEENT - normocephalic; sclerae clear, gaze conjugate; mucous membranes moist, dentition good; voice normal  Neck - asymmetric on extension; no palpable anterior or posterior cervical adenopathy; palpable smooth enlarged nodule in lower left thyroid lobe, extending beneath the head of the clavicle, mobile with swallowing, nontender  Chest - clear bilaterally with rhonchi, rales, or wheeze  Cor - regular rhythm with normal rate; no significant murmur  Ext - non-tender without significant edema or lymphedema  Neuro - grossly intact; no tremor    Assessment & Plan Earnstine Regal MD; 12/29/2014 5:24 PM) NEOPLASM OF UNCERTAIN BEHAVIOR OF THYROID GLAND (237.4  D44.0) Current Plans  Instructed to keep follow-up appointment as scheduled  The patient has a thyroid neoplasm of uncertain behavior involving the left thyroid lobe measuring 4.6 cm. The risk of malignancy in this lesion is approximately 20-25%.  I discussed the above findings at length with the patient and his wife. We discussed the indications for surgery. We discussed the potential need for completion thyroidectomy and possible treatment with radioactive iodine in the event of malignancy. We discussed the risk and benefits of the procedure including the risk of recurrent laryngeal nerve injury or  injury to parathyroid glands. We discussed the surgical incision, the hospital stay to be anticipated, and the postoperative recovery. We discussed the potential need for lifelong thyroid hormone supplementation. Patient and his wife understand and wish to proceed with surgery in the near future.  The risks and benefits of the procedure have been discussed at length with the patient. The patient understands the proposed procedure, potential alternative treatments, and the course of recovery to be expected. All of the patient's questions have been answered at this time. The patient wishes to proceed with surgery.  Earnstine Regal, MD, [facility] Office: [phone]

## 2015-01-20 NOTE — Brief Op Note (Signed)
01/20/2015  3:06 PM  PATIENT:  Nicholas Lewis  74 y.o. male  PRE-OPERATIVE DIAGNOSIS:  THYROID NEOPLASM OF UNCERTAIN BEHAVIOR, left lobe  POST-OPERATIVE DIAGNOSIS:  same  PROCEDURE:  Procedure(s): LEFT THYROID LOBECTOMY (Left)  SURGEON:  Surgeon(s) and Role:    * Armandina Gemma, MD - Primary  ANESTHESIA:   general  EBL:     BLOOD ADMINISTERED:none  DRAINS: none   LOCAL MEDICATIONS USED:  NONE  SPECIMEN:  Excision  DISPOSITION OF SPECIMEN:  PATHOLOGY  COUNTS:  YES  TOURNIQUET:  * No tourniquets in log *  DICTATION: .Other Dictation: Dictation Number [id number]  PLAN OF CARE: Admit for overnight observation  PATIENT DISPOSITION:  PACU - hemodynamically stable.   Delay start of Pharmacological VTE agent (>24hrs) due to surgical blood loss or risk of bleeding: yes  Earnstine Regal, MD, [facility] Office: [phone]

## 2015-01-20 NOTE — Transfer of Care (Signed)
Immediate Anesthesia Transfer of Care Note  Patient: Nicholas Lewis  Procedure(s) Performed: Procedure(s): LEFT THYROID LOBECTOMY (Left)  Patient Location: PACU  Anesthesia Type:General  Level of Consciousness: awake, alert  and oriented  Airway & Oxygen Therapy: Patient Spontanous Breathing and Patient connected to face mask oxygen  Post-op Assessment: Report given to PACU RN and Post -op Vital signs reviewed and stable  Post vital signs: Reviewed and stable  Complications: No apparent anesthesia complications

## 2015-01-20 NOTE — Anesthesia Procedure Notes (Signed)
Procedure Name: Intubation Date/Time: 01/20/2015 2:00 PM Performed by: Carleene Cooper A Pre-anesthesia Checklist: Patient identified, Timeout performed, Emergency Drugs available, Suction available and Patient being monitored Patient Re-evaluated:Patient Re-evaluated prior to inductionOxygen Delivery Method: Circle system utilized Preoxygenation: Pre-oxygenation with 100% oxygen Intubation Type: IV induction Ventilation: Mask ventilation without difficulty Laryngoscope Size: Mac and 4 Grade View: Grade I Tube type: Oral Tube size: 7.5 mm Number of attempts: 1 Airway Equipment and Method: Stylet Secured at: 22 cm Tube secured with: Tape Dental Injury: Teeth and Oropharynx as per pre-operative assessment

## 2015-01-20 NOTE — Anesthesia Postprocedure Evaluation (Signed)
  Anesthesia Post-op Note  Patient: Nicholas Lewis  Procedure(s) Performed: Procedure(s): LEFT THYROID LOBECTOMY (Left)  Patient Location: PACU  Anesthesia Type:General  Level of Consciousness: awake and alert   Airway and Oxygen Therapy: Patient Spontanous Breathing and Patient connected to nasal cannula oxygen  Post-op Pain: mild  Post-op Assessment: Post-op Vital signs reviewed, Patient's Cardiovascular Status Stable and Respiratory Function Stable  Post-op Vital Signs: Reviewed and stable  Last Vitals:  Filed Vitals:   01/20/15 1525  BP:   Pulse: 47  Temp:   Resp: 15    Complications: No apparent anesthesia complications

## 2015-01-21 DIAGNOSIS — D34 Benign neoplasm of thyroid gland: Secondary | ICD-10-CM | POA: Diagnosis not present

## 2015-01-21 DIAGNOSIS — I10 Essential (primary) hypertension: Secondary | ICD-10-CM | POA: Diagnosis not present

## 2015-01-21 DIAGNOSIS — Z885 Allergy status to narcotic agent status: Secondary | ICD-10-CM | POA: Diagnosis not present

## 2015-01-21 DIAGNOSIS — Z882 Allergy status to sulfonamides status: Secondary | ICD-10-CM | POA: Diagnosis not present

## 2015-01-21 MED ORDER — OXYCODONE HCL 5 MG PO TABS: 5.0000 mg | ORAL_TABLET | ORAL | Status: DC | PRN

## 2015-01-21 NOTE — Op Note (Signed)
NAMEROBBIE, RIDEAUX NO.:  [id number]  MEDICAL RECORD NO.:  [id number]  LOCATION:  0177                         FACILITY:  [facility]  PHYSICIAN:  Earnstine Regal, MD      DATE OF BIRTH:  09-12-1941  DATE OF PROCEDURE:  01/20/2015                              OPERATIVE REPORT   PREOPERATIVE DIAGNOSIS:  Left thyroid neoplasm of uncertain behavior.  POSTOPERATIVE DIAGNOSIS:  Left thyroid neoplasm of uncertain behavior.  PROCEDURE:  Left thyroid lobectomy.  SURGEON:  Earnstine Regal, MD, FACS  ANESTHESIA:  General.  ESTIMATED BLOOD LOSS:  Minimal.  PREPARATION:  ChloraPrep.  COMPLICATIONS:  None.  INDICATIONS:  The patient is a 74 year old male referred by Dr. Dorris Fetch for evaluation of left thyroid nodule.  The patient has a dominant 4.6 cm complex nodule in the left thyroid lobe.  This was identified incidentally on CT scanning.  Ultrasound-guided fine needle aspiration biopsy shows a follicular neoplasm, Bethesda class 3, with mild cytologic atypia.  There were no nodules in the right thyroid lobe.  The patient now comes to Surgery for left thyroid lobectomy for definitive diagnosis.  BODY OF REPORT:  Procedure was done in OR #1 at the [facility].  The patient was brought to the operating room, placed in supine position on the operating room table.  Following administration of general anesthesia, the patient was positioned and then prepped and draped in the usual aseptic fashion.  After ascertaining that an adequate level of anesthesia had been achieved, a Kocher incision was made with a #15 blade.  Dissection was carried through subcutaneous tissues and platysma.  Hemostasis was achieved with the electrocautery.  Skin flaps were elevated cephalad and caudad from the thyroid notch to the sternal notch.  The [place] self-retaining retractor was placed for exposure.  Strap muscles were incised into midline.  Palpation of the right thyroid lobe  shows no nodularity and no gross abnormal findings.  Left thyroid lobe was moderately enlarged and contains a dominant mass.  Lobe was gently mobilized with blunt dissection.  Middle thyroid vein was divided between medium Ligaclips with the Harmonic scalpel.  Superior pole was dissected out and superior pole vessels divided individually between small and medium Ligaclips with the Harmonic scalpel.  Superior parathyroid gland was identified and preserved.  Inferior pole was mobilized.  Venous tributaries were divided between medium Ligaclips.  Inferior parathyroid gland was identified and preserved on its vascular pedicle.  Gland was rolled anteriorly.  Branches of the inferior thyroid artery were divided between small and medium Ligaclips.  Recurrent laryngeal nerve was identified and preserved.  Ligament of Gwenlyn Found was released with the electrocautery and the lobe was mobilized up and onto the anterior trachea.  Isthmus was mobilized across the midline.  There was no significant pyramidal lobe present.  Thyroid parenchyma was transected at the junction of the isthmus and right thyroid lobe.  Good hemostasis was achieved with the electrocautery.  Left thyroid lobe was submitted to Pathology for review.  Neck was irrigated with warm saline.  Good hemostasis was achieved throughout the operative field.  Fibrillar was placed throughout the operative field.  Strap muscles were reapproximated in  the midline with interrupted 3-0 Vicryl sutures.  Platysma was closed with interrupted 3- 0 Vicryl sutures.  Skin was closed with a running 4-0 Monocryl subcuticular suture.  Wound was washed and dried and Dermabond was applied as dressing.  The patient was awakened from anesthesia and brought to the recovery room.  The patient tolerated the procedure well.    Earnstine Regal, MD, [facility] Office: [phone]   TMG/MEDQ  D:  01/20/2015  T:   01/21/2015  Job:  [id number]  cc:   Loni Beckwith, MD Fax: [phone]

## 2015-01-21 NOTE — Discharge Instructions (Signed)

## 2015-01-21 NOTE — Progress Notes (Signed)
Discharge instructions given to patient along with prescriptions.  Questions answered 

## 2015-01-21 NOTE — Discharge Summary (Signed)
  Physician Discharge Summary [facility]  Patient ID: Nicholas Lewis MRN: [id number] DOB/AGE: Nov 17, 1941 74 y.o.  Admit date: 01/20/2015 Discharge date: 01/21/2015  Admission Diagnoses:  Thyroid neoplasm of uncertain behavior  Discharge Diagnoses:  Principal Problem:   Neoplasm of uncertain behavior of thyroid gland   Discharged Condition: good  Hospital Course: Patient was admitted for observation following thyroid surgery.  Post op course was uncomplicated.  Pain was well controlled.  Tolerated diet.  Patient was prepared for discharge home on POD#1.  Consults: None  Treatments: surgery: left thyroid lobectomy  Discharge Exam: Blood pressure 120/63, pulse 68, temperature 97.7 F (36.5 C), temperature source Oral, resp. rate 18, height 5\' 10"  (1.778 m), weight 210 lb (95.255 kg), SpO2 98 %. HEENT - clear Neck - wound clear and dry; minimal STS; voice normal Chest - clear bilaterally Cor - RRR  Disposition: Home     Medication List    ASK your doctor about these medications        allopurinol 300 MG tablet  Commonly known as:  ZYLOPRIM  Take 300 mg by mouth every morning.     amoxicillin-clavulanate 875-125 MG per tablet  Commonly known as:  AUGMENTIN  Take 1 tablet by mouth 2 (two) times daily. Started on 01/14/2015     aspirin EC 81 MG tablet  Take 81 mg by mouth daily. Last dose on 01/14/2015 per patient.     hydrochlorothiazide 25 MG tablet  Commonly known as:  HYDRODIURIL  Take 25 mg by mouth every morning.     HYDROmorphone 2 MG tablet  Commonly known as:  DILAUDID  Take 1-2 tablets (2-4 mg total) by mouth every 3 (three) hours as needed for severe pain.     methocarbamol 500 MG tablet  Commonly known as:  ROBAXIN  Take 1 tablet (500 mg total) by mouth every 6 (six) hours as needed for muscle spasms.     metoprolol 50 MG tablet  Commonly known as:  LOPRESSOR  Take 50 mg by mouth 2 (two) times daily.     multivitamin with  minerals Tabs tablet  Take 1 tablet by mouth daily.     omeprazole 20 MG capsule  Commonly known as:  PRILOSEC  Take 1 capsule (20 mg total) by mouth 2 (two) times daily before a meal.     SAW PALMETTO BERRIES PO  Take 1 capsule by mouth daily.     traMADol 50 MG tablet  Commonly known as:  ULTRAM  Take 1-2 tablets (50-100 mg total) by mouth every 6 (six) hours as needed (mild pain).         Earnstine Regal, MD, [facility] Office: [phone]   Signed: Earnstine Regal 01/21/2015, 8:26 AM

## 2015-01-24 ENCOUNTER — Encounter (HOSPITAL_COMMUNITY): Payer: Self-pay | Admitting: Surgery

## 2015-01-24 NOTE — Progress Notes (Signed)
Quick Note:  Please contact patient and notify of benign pathology results.  Ulla Mckiernan M. Rendell Thivierge, MD, FACS [facility] Office: [phone]   ______ 

## 2015-02-08 DIAGNOSIS — M2042 Other hammer toe(s) (acquired), left foot: Secondary | ICD-10-CM | POA: Diagnosis not present

## 2015-02-08 DIAGNOSIS — M79676 Pain in unspecified toe(s): Secondary | ICD-10-CM | POA: Diagnosis not present

## 2015-02-16 DIAGNOSIS — E041 Nontoxic single thyroid nodule: Secondary | ICD-10-CM | POA: Diagnosis not present

## 2015-02-28 DIAGNOSIS — E6609 Other obesity due to excess calories: Secondary | ICD-10-CM | POA: Diagnosis not present

## 2015-02-28 DIAGNOSIS — J209 Acute bronchitis, unspecified: Secondary | ICD-10-CM | POA: Diagnosis not present

## 2015-02-28 DIAGNOSIS — Z6832 Body mass index (BMI) 32.0-32.9, adult: Secondary | ICD-10-CM | POA: Diagnosis not present

## 2015-02-28 DIAGNOSIS — J029 Acute pharyngitis, unspecified: Secondary | ICD-10-CM | POA: Diagnosis not present

## 2015-03-31 DIAGNOSIS — Z96651 Presence of right artificial knee joint: Secondary | ICD-10-CM | POA: Diagnosis not present

## 2015-03-31 DIAGNOSIS — Z471 Aftercare following joint replacement surgery: Secondary | ICD-10-CM | POA: Diagnosis not present

## 2015-04-19 DIAGNOSIS — D440 Neoplasm of uncertain behavior of thyroid gland: Secondary | ICD-10-CM | POA: Diagnosis not present

## 2015-06-13 DIAGNOSIS — E6609 Other obesity due to excess calories: Secondary | ICD-10-CM | POA: Diagnosis not present

## 2015-06-13 DIAGNOSIS — E890 Postprocedural hypothyroidism: Secondary | ICD-10-CM | POA: Diagnosis not present

## 2015-06-13 DIAGNOSIS — Z6833 Body mass index (BMI) 33.0-33.9, adult: Secondary | ICD-10-CM | POA: Diagnosis not present

## 2015-07-22 DIAGNOSIS — E890 Postprocedural hypothyroidism: Secondary | ICD-10-CM | POA: Diagnosis not present

## 2015-09-29 DIAGNOSIS — Z23 Encounter for immunization: Secondary | ICD-10-CM | POA: Diagnosis not present

## 2015-10-12 ENCOUNTER — Encounter: Payer: Self-pay | Admitting: Internal Medicine

## 2015-11-22 DIAGNOSIS — Z6835 Body mass index (BMI) 35.0-35.9, adult: Secondary | ICD-10-CM | POA: Diagnosis not present

## 2015-11-22 DIAGNOSIS — Z125 Encounter for screening for malignant neoplasm of prostate: Secondary | ICD-10-CM | POA: Diagnosis not present

## 2015-11-22 DIAGNOSIS — E039 Hypothyroidism, unspecified: Secondary | ICD-10-CM | POA: Diagnosis not present

## 2015-11-22 DIAGNOSIS — Z0000 Encounter for general adult medical examination without abnormal findings: Secondary | ICD-10-CM | POA: Diagnosis not present

## 2015-11-22 DIAGNOSIS — Z1389 Encounter for screening for other disorder: Secondary | ICD-10-CM | POA: Diagnosis not present

## 2015-11-22 DIAGNOSIS — Z79899 Other long term (current) drug therapy: Secondary | ICD-10-CM | POA: Diagnosis not present

## 2015-11-22 DIAGNOSIS — I10 Essential (primary) hypertension: Secondary | ICD-10-CM | POA: Diagnosis not present

## 2015-11-22 DIAGNOSIS — E782 Mixed hyperlipidemia: Secondary | ICD-10-CM | POA: Diagnosis not present

## 2015-12-20 DIAGNOSIS — D239 Other benign neoplasm of skin, unspecified: Secondary | ICD-10-CM | POA: Diagnosis not present

## 2015-12-20 DIAGNOSIS — L821 Other seborrheic keratosis: Secondary | ICD-10-CM | POA: Diagnosis not present

## 2015-12-20 DIAGNOSIS — L570 Actinic keratosis: Secondary | ICD-10-CM | POA: Diagnosis not present

## 2015-12-27 ENCOUNTER — Other Ambulatory Visit: Payer: Self-pay

## 2015-12-27 ENCOUNTER — Ambulatory Visit (INDEPENDENT_AMBULATORY_CARE_PROVIDER_SITE_OTHER): Payer: Medicare Other | Admitting: Nurse Practitioner

## 2015-12-27 ENCOUNTER — Encounter: Payer: Self-pay | Admitting: Nurse Practitioner

## 2015-12-27 VITALS — BP 136/72 | HR 63 | Temp 97.3°F | Ht 71.0 in | Wt 235.6 lb

## 2015-12-27 DIAGNOSIS — Z800 Family history of malignant neoplasm of digestive organs: Secondary | ICD-10-CM | POA: Insufficient documentation

## 2015-12-27 DIAGNOSIS — Z85038 Personal history of other malignant neoplasm of large intestine: Secondary | ICD-10-CM

## 2015-12-27 DIAGNOSIS — Z86010 Personal history of colonic polyps: Secondary | ICD-10-CM

## 2015-12-27 DIAGNOSIS — Z860101 Personal history of adenomatous and serrated colon polyps: Secondary | ICD-10-CM

## 2015-12-27 MED ORDER — PEG 3350-KCL-NA BICARB-NACL 420 G PO SOLR: 4000.0000 mL | Freq: Once | ORAL | Status: DC

## 2015-12-27 NOTE — Progress Notes (Signed)
Primary Care Physician:  Purvis Kilts, MD Primary Gastroenterologist:  Dr. Gala Romney  Chief Complaint  Patient presents with  . Colonoscopy    HPI:   74 year old male with a positive family history of colon cancer presents for routine 5 year repeat surveillance colonoscopy. His last colonoscopy was completed 11/10/2010 under conscious sedation. Findings included internal hemorrhoids, anal papilla, otherwise normal rectum. 2 polyps removed 1 in the cecum and one in the sigmoid segment and sent to pathology. Pathology noted both polyps to be adenomatous. Recommended fiber intake, Anusol suppositories, repeat colonoscopy in 5 years.  Today he states he feels fine. Just had his physical and things are good. Denies abdominal pain, N/V. Has some looser stools recently which he thinks is due to richer foods at the holidays. Has hemorrhoids which have been aggravating, improved with rectal cream/suppositories. Is prone to constipation and takes daily stool softer. Rare toilet tissue hematochezia, no significant overt bleeding. Last episode several months ago. Deneis fever, chills, unintentional weight loss. Denies chest pain, dyspnea, dizziness, lightheadedness, syncope, near syncope. Denies any other upper or lower GI symptoms.  Past Medical History  Diagnosis Date  . Complication of anesthesia     slow to wake up  . Hypertension   . Arthritis   . Gout   . Abrasion     l arm  . Cellulitis 07/2012    after surgery for left knee  . Cancer ([place])     skin  . Pneumonia     hx of 2015   . Dog scratch 01/14/2015     office visit note from PCP on chart( to lower lip)     Past Surgical History  Procedure Laterality Date  . Knee arthroscopy  2000    left  . Cystectomy  2003    from spine  . Total knee arthroplasty  08/06/2012    Procedure: TOTAL KNEE ARTHROPLASTY;  Surgeon: Magnus Sinning, MD;  Location: [facility];  Service: Orthopedics;  Laterality: Left;  . Total knee arthroplasty  Right 09/20/2014    Procedure: RIGHT TOTAL KNEE ARTHROPLASTY;  Surgeon: Gearlean Alf, MD;  Location: [facility];  Service: Orthopedics;  Laterality: Right;  . Joint replacement    . Back surgery    . Thyroid lobectomy Left 01/20/2015    Procedure: LEFT THYROID LOBECTOMY;  Surgeon: Armandina Gemma, MD;  Location: [facility];  Service: General;  Laterality: Left;  . Colonoscopy  11/10/2010    RMR: internal hemorrhoids , anal papilla, otherwise normal rectum.  Long tortuous colon , polyps  in the cecum and sigmoid segment status post removeal as described above.     Current Outpatient Prescriptions  Medication Sig Dispense Refill  . allopurinol (ZYLOPRIM) 300 MG tablet Take 300 mg by mouth every morning.    Marland Kitchen aspirin EC 81 MG tablet Take 81 mg by mouth daily. Last dose on 01/14/2015 per patient.    . hydrochlorothiazide (HYDRODIURIL) 25 MG tablet Take 25 mg by mouth every morning.    Marland Kitchen levothyroxine (SYNTHROID, LEVOTHROID) 100 MCG tablet Take 100 mcg by mouth daily before breakfast.   0  . metoprolol (LOPRESSOR) 50 MG tablet Take 50 mg by mouth 2 (two) times daily.    . Multiple Vitamin (MULTIVITAMIN WITH MINERALS) TABS tablet Take 1 tablet by mouth daily.    . Saw Palmetto, Serenoa repens, (SAW PALMETTO BERRIES PO) Take 1 capsule by mouth daily.    Marland Kitchen amoxicillin-clavulanate (AUGMENTIN) 875-125 MG per tablet Take 1 tablet  by mouth 2 (two) times daily. Reported on 12/27/2015    . HYDROmorphone (DILAUDID) 2 MG tablet Take 1-2 tablets (2-4 mg total) by mouth every 3 (three) hours as needed for severe pain. (Patient not taking: Reported on 01/14/2015) 80 tablet 0  . methocarbamol (ROBAXIN) 500 MG tablet Take 1 tablet (500 mg total) by mouth every 6 (six) hours as needed for muscle spasms. (Patient not taking: Reported on 01/14/2015) 80 tablet 0  . omeprazole (PRILOSEC) 20 MG capsule Take 1 capsule (20 mg total) by mouth 2 (two) times daily before a meal. (Patient not taking: Reported on 01/14/2015) 30 capsule 0  .  oxyCODONE (OXY IR/ROXICODONE) 5 MG immediate release tablet Take 1-2 tablets (5-10 mg total) by mouth every 4 (four) hours as needed for moderate pain. (Patient not taking: Reported on 12/27/2015) 20 tablet 0  . traMADol (ULTRAM) 50 MG tablet Take 1-2 tablets (50-100 mg total) by mouth every 6 (six) hours as needed (mild pain). (Patient not taking: Reported on 01/14/2015) 60 tablet 1   No current facility-administered medications for this visit.    Allergies as of 12/27/2015 - Review Complete 12/27/2015  Allergen Reaction Noted  . Percocet [oxycodone-acetaminophen]  09/20/2014  . Sulfa antibiotics  07/22/2012  . Mobic [meloxicam] Rash 07/23/2012    No family history on file.  Social History   Social History  . Marital Status: Married    Spouse Name: N/A  . Number of Children: N/A  . Years of Education: N/A   Occupational History  . Not on file.   Social History Main Topics  . Smoking status: Never Smoker   . Smokeless tobacco: Never Used  . Alcohol Use: No  . Drug Use: No  . Sexual Activity: Not on file   Other Topics Concern  . Not on file   Social History Narrative    Review of Systems: General: Negative for anorexia, weight loss, fever, chills, fatigue, weakness. Eyes: Negative for vision changes.  ENT: Negative for hoarseness, nasal congestion. CV: Negative for chest pain, angina, palpitations, peripheral edema.  Respiratory: Negative for dyspnea at rest, cough, sputum, wheezing.  GI: See history of present illness. Derm: Negative for rash or itching.  Endo: Negative for unusual weight change.  Heme: Negative for bruising or bleeding. Allergy: Negative for rash or hives.    Physical Exam: BP 136/72 mmHg  Pulse 63  Temp(Src) 97.3 F (36.3 C) (Oral)  Ht 5\' 11"  (1.803 m)  Wt 235 lb 9.6 oz (106.867 kg)  BMI 32.87 kg/m2 General:   Alert and oriented. Pleasant and cooperative. Well-nourished and well-developed.  Head:  Normocephalic and atraumatic. Eyes:   Without icterus, sclera clear and conjunctiva pink.  Ears:  Normal auditory acuity. Cardiovascular:  S1, S2 present without murmurs appreciated.Extremities without clubbing or edema. Respiratory:  Clear to auscultation bilaterally. No wheezes, rales, or rhonchi. No distress.  Gastrointestinal:  +BS, rounded but soft, non-tender and non-distended. No HSM noted. No guarding or rebound. No masses appreciated.  Rectal:  Deferred  Neurologic:  Alert and oriented x4;  grossly normal neurologically. Psych:  Alert and cooperative. Normal mood and affect. Heme/Lymph/Immune: No excessive bruising noted.    12/27/2015 1:32 PM

## 2015-12-27 NOTE — Assessment & Plan Note (Signed)
History of adenomatous polyp of the colon. Last colonoscopy 5 years ago which found 2 polyps which were both found to be adenoma on surgical pathology. Recommended repeat 5 year colonoscopy. Proceed with a colonoscopy as previously recommended as noted above. Return for follow-up as needed.

## 2015-12-27 NOTE — Patient Instructions (Signed)
1. We'll schedule your procedure for you. 2. Further recommendations to be based on results of your procedure. 3. Return for follow-up as needed for any stomach or colon problems.

## 2015-12-27 NOTE — Assessment & Plan Note (Signed)
Patient with a positive family history of colon cancer in his father who was diagnosed at age 73 and subsequently passed away after prolonged Battle. Last colonoscopy 5 years ago and recommended repeat screening in 5 years due to high risk. We'll proceed with colonoscopy as previously recommended. Return for follow-up as needed  Proceed with TCS with Dr. Gala Romney in near future: the risks, benefits, and alternatives have been discussed with the patient in detail. The patient states understanding and desires to proceed.  Isn't is not on any anticoagulants, anxiolytics, chronic pain medications, or antidepressants. Conscious sedation should be adequate for his procedure.  Pertinent to note, the patient states he has prolonged period of awakening from sedation and/or anesthesia and is requesting the endoscopist use minimal amount necessary.

## 2015-12-27 NOTE — Progress Notes (Signed)
cc'ed to pcp °

## 2016-01-05 ENCOUNTER — Encounter (HOSPITAL_COMMUNITY): Payer: Self-pay | Admitting: *Deleted

## 2016-01-05 ENCOUNTER — Encounter: Payer: Self-pay | Admitting: Internal Medicine

## 2016-01-05 ENCOUNTER — Telehealth: Payer: Self-pay

## 2016-01-05 ENCOUNTER — Ambulatory Visit (HOSPITAL_COMMUNITY)
Admission: RE | Admit: 2016-01-05 | Discharge: 2016-01-05 | Disposition: A | Discharge status: Home / Self Care | Payer: Medicare Other | Source: Ambulatory Visit | Attending: Internal Medicine | Admitting: Internal Medicine

## 2016-01-05 ENCOUNTER — Encounter (HOSPITAL_COMMUNITY)
Admission: RE | Disposition: A | Discharge status: Home / Self Care | Payer: Self-pay | Source: Ambulatory Visit | Attending: Internal Medicine

## 2016-01-05 DIAGNOSIS — M1990 Unspecified osteoarthritis, unspecified site: Secondary | ICD-10-CM | POA: Diagnosis not present

## 2016-01-05 DIAGNOSIS — Z86010 Personal history of colonic polyps: Secondary | ICD-10-CM | POA: Diagnosis not present

## 2016-01-05 DIAGNOSIS — C4490 Unspecified malignant neoplasm of skin, unspecified: Secondary | ICD-10-CM | POA: Diagnosis not present

## 2016-01-05 DIAGNOSIS — Z800 Family history of malignant neoplasm of digestive organs: Secondary | ICD-10-CM | POA: Diagnosis not present

## 2016-01-05 DIAGNOSIS — Q438 Other specified congenital malformations of intestine: Secondary | ICD-10-CM | POA: Insufficient documentation

## 2016-01-05 DIAGNOSIS — Z1211 Encounter for screening for malignant neoplasm of colon: Secondary | ICD-10-CM | POA: Diagnosis not present

## 2016-01-05 DIAGNOSIS — Z96652 Presence of left artificial knee joint: Secondary | ICD-10-CM | POA: Diagnosis not present

## 2016-01-05 DIAGNOSIS — Z85038 Personal history of other malignant neoplasm of large intestine: Secondary | ICD-10-CM

## 2016-01-05 DIAGNOSIS — Z79899 Other long term (current) drug therapy: Secondary | ICD-10-CM | POA: Diagnosis not present

## 2016-01-05 DIAGNOSIS — Z7982 Long term (current) use of aspirin: Secondary | ICD-10-CM | POA: Insufficient documentation

## 2016-01-05 DIAGNOSIS — I10 Essential (primary) hypertension: Secondary | ICD-10-CM | POA: Diagnosis not present

## 2016-01-05 DIAGNOSIS — Z860101 Personal history of adenomatous and serrated colon polyps: Secondary | ICD-10-CM

## 2016-01-05 HISTORY — PX: COLONOSCOPY: SHX5424

## 2016-01-05 SURGERY — COLONOSCOPY
Anesthesia: Moderate Sedation

## 2016-01-05 MED ORDER — MIDAZOLAM HCL 5 MG/5ML IJ SOLN
INTRAMUSCULAR | Status: AC
  Filled 2016-01-05: qty 10

## 2016-01-05 MED ORDER — ONDANSETRON HCL 4 MG/2ML IJ SOLN
INTRAMUSCULAR | Status: DC | PRN
  Administered 2016-01-05: 4 mg via INTRAVENOUS

## 2016-01-05 MED ORDER — ONDANSETRON HCL 4 MG/2ML IJ SOLN
INTRAMUSCULAR | Status: AC
  Filled 2016-01-05: qty 2

## 2016-01-05 MED ORDER — MEPERIDINE HCL 100 MG/ML IJ SOLN
INTRAMUSCULAR | Status: AC
  Filled 2016-01-05: qty 2

## 2016-01-05 MED ORDER — STERILE WATER FOR IRRIGATION IR SOLN
Status: DC | PRN
  Administered 2016-01-05: 10:00:00

## 2016-01-05 MED ORDER — SODIUM CHLORIDE 0.9 % IV SOLN
INTRAVENOUS | Status: DC
Start: 2016-01-05 — End: 2016-01-05
  Administered 2016-01-05: 1000 mL via INTRAVENOUS

## 2016-01-05 MED ORDER — MEPERIDINE HCL 100 MG/ML IJ SOLN
INTRAMUSCULAR | Status: DC | PRN
  Administered 2016-01-05: 50 mg via INTRAVENOUS
  Administered 2016-01-05: 25 mg via INTRAVENOUS

## 2016-01-05 MED ORDER — MIDAZOLAM HCL 5 MG/5ML IJ SOLN
INTRAMUSCULAR | Status: DC | PRN
  Administered 2016-01-05: 1 mg via INTRAVENOUS
  Administered 2016-01-05: 2 mg via INTRAVENOUS
  Administered 2016-01-05: 1 mg via INTRAVENOUS

## 2016-01-05 NOTE — Op Note (Signed)
[facility] [address] [place], [address]   COLONOSCOPY PROCEDURE REPORT  PATIENT: Nicholas Lewis, Nicholas Lewis  MR#: [id number] BIRTHDATE: January 12, 1941 , 74  yrs. old GENDER: male ENDOSCOPIST: R.  Garfield Cornea, MD FACP [facility] REFERRED [id number] Hilma Favors, M.D. PROCEDURE DATE:  01/24/2016 PROCEDURE:   Colonoscopy, surveillance INDICATIONS:Personal history of colonic adenoma; positive family history of colon cancer. MEDICATIONS: Versed 4 mg IV and Demerol 75 mg IV in divided doses. Zofran 4 mg IV. ASA CLASS:       Class II  CONSENT: The risks, benefits, alternatives and imponderables including but not limited to bleeding, perforation as well as the possibility of a missed lesion have been reviewed.  The potential for biopsy, lesion removal, etc. have also been discussed. Questions have been answered.  All parties agreeable.  Please see the history and physical in the medical record for more information.  DESCRIPTION OF PROCEDURE:   After the risks benefits and alternatives of the procedure were thoroughly explained, informed consent was obtained.  The digital rectal exam revealed no abnormalities of the rectum.   The [id number]  endoscope was introduced through the anus and advanced to the cecum, which was identified by both the appendix and ileocecal valve. No adverse events experienced.   The quality of the prep was adequate  The instrument was then slowly withdrawn as the colon was fully examined. Estimated blood loss is zero unless otherwise noted in this procedure report.      COLON FINDINGS: Normal-appearing rectal mucosa.  Normal-appearing colonic mucosa.  Colon was somewhat redundant requiring external abdominal pressure to reach the cecum.  Retroflexion was performed. .  Withdrawal time=7 minutes 0 seconds.  The scope was withdrawn and the procedure completed. COMPLICATIONS: There were no immediate complications.  ENDOSCOPIC IMPRESSION: Normal  colonoscopy (redundant colon)  RECOMMENDATIONS: Recommend one more colonoscopy 5 years if overall health events  eSigned:  R. Garfield Cornea, MD Rosalita Chessman [facility] 01/24/16 10:32 AM   cc:  CPT CODES: ICD CODES:  The ICD and CPT codes recommended by this software are interpretations from the data that the clinical staff has captured with the software.  The verification of the translation of this report to the ICD and CPT codes and modifiers is the sole responsibility of the health care institution and practicing physician where this report was generated.  [employer] will not be held responsible for the validity of the ICD and CPT codes included on this report.  AMA assumes no liability for data contained or not contained herein. CPT is a Designer, television/film set of the [employer].

## 2016-01-05 NOTE — Telephone Encounter (Signed)
RMR called- pt needs a hemorrhoid banding brochure mailed to him. I have put this in the mail.  Pt also needs an office visit with RMR in 1 month to discuss bandings and possibly do a banding. Friday if possible. Please schedule.

## 2016-01-05 NOTE — Telephone Encounter (Signed)
APPT MADE AND LETTER SENT  °

## 2016-01-05 NOTE — Interval H&P Note (Signed)
History and Physical Interval Note:  01/05/2016 9:58 AM  Nicholas Lewis  has presented today for surgery, with the diagnosis of family history of colon cancer, history of colon adenoma  The various methods of treatment have been discussed with the patient and family. After consideration of risks, benefits and other options for treatment, the patient has consented to  Procedure(s) with comments: COLONOSCOPY (N/A) - 1330 - moved to 9:30 - office to notify  as a surgical intervention .  The patient's history has been reviewed, patient examined, no change in status, stable for surgery.  I have reviewed the patient's chart and labs.  Questions were answered to the patient's satisfaction.     Robert Rourk  No change. Surveillance colonoscopy per plan.The risks, benefits, limitations, alternatives and imponderables have been reviewed with the patient. Questions have been answered. All parties are agreeable.

## 2016-01-05 NOTE — H&P (View-Only) (Signed)
Primary Care Physician:  Purvis Kilts, MD Primary Gastroenterologist:  Dr. Gala Romney  Chief Complaint  Patient presents with  . Colonoscopy    HPI:   75 year old male with a positive family history of colon cancer presents for routine 5 year repeat surveillance colonoscopy. His last colonoscopy was completed 11/10/2010 under conscious sedation. Findings included internal hemorrhoids, anal papilla, otherwise normal rectum. 2 polyps removed 1 in the cecum and one in the sigmoid segment and sent to pathology. Pathology noted both polyps to be adenomatous. Recommended fiber intake, Anusol suppositories, repeat colonoscopy in 5 years.  Today he states he feels fine. Just had his physical and things are good. Denies abdominal pain, N/V. Has some looser stools recently which he thinks is due to richer foods at the holidays. Has hemorrhoids which have been aggravating, improved with rectal cream/suppositories. Is prone to constipation and takes daily stool softer. Rare toilet tissue hematochezia, no significant overt bleeding. Last episode several months ago. Deneis fever, chills, unintentional weight loss. Denies chest pain, dyspnea, dizziness, lightheadedness, syncope, near syncope. Denies any other upper or lower GI symptoms.  Past Medical History  Diagnosis Date  . Complication of anesthesia     slow to wake up  . Hypertension   . Arthritis   . Gout   . Abrasion     l arm  . Cellulitis 07/2012    after surgery for left knee  . Cancer ([place])     skin  . Pneumonia     hx of 2015   . Dog scratch 01/14/2015     office visit note from PCP on chart( to lower lip)     Past Surgical History  Procedure Laterality Date  . Knee arthroscopy  2000    left  . Cystectomy  2003    from spine  . Total knee arthroplasty  08/06/2012    Procedure: TOTAL KNEE ARTHROPLASTY;  Surgeon: Magnus Sinning, MD;  Location: [facility];  Service: Orthopedics;  Laterality: Left;  . Total knee arthroplasty  Right 09/20/2014    Procedure: RIGHT TOTAL KNEE ARTHROPLASTY;  Surgeon: Gearlean Alf, MD;  Location: [facility];  Service: Orthopedics;  Laterality: Right;  . Joint replacement    . Back surgery    . Thyroid lobectomy Left 01/20/2015    Procedure: LEFT THYROID LOBECTOMY;  Surgeon: Armandina Gemma, MD;  Location: [facility];  Service: General;  Laterality: Left;  . Colonoscopy  11/10/2010    RMR: internal hemorrhoids , anal papilla, otherwise normal rectum.  Long tortuous colon , polyps  in the cecum and sigmoid segment status post removeal as described above.     Current Outpatient Prescriptions  Medication Sig Dispense Refill  . allopurinol (ZYLOPRIM) 300 MG tablet Take 300 mg by mouth every morning.    Marland Kitchen aspirin EC 81 MG tablet Take 81 mg by mouth daily. Last dose on 01/14/2015 per patient.    . hydrochlorothiazide (HYDRODIURIL) 25 MG tablet Take 25 mg by mouth every morning.    Marland Kitchen levothyroxine (SYNTHROID, LEVOTHROID) 100 MCG tablet Take 100 mcg by mouth daily before breakfast.   0  . metoprolol (LOPRESSOR) 50 MG tablet Take 50 mg by mouth 2 (two) times daily.    . Multiple Vitamin (MULTIVITAMIN WITH MINERALS) TABS tablet Take 1 tablet by mouth daily.    . Saw Palmetto, Serenoa repens, (SAW PALMETTO BERRIES PO) Take 1 capsule by mouth daily.    Marland Kitchen amoxicillin-clavulanate (AUGMENTIN) 875-125 MG per tablet Take 1 tablet  by mouth 2 (two) times daily. Reported on 12/27/2015    . HYDROmorphone (DILAUDID) 2 MG tablet Take 1-2 tablets (2-4 mg total) by mouth every 3 (three) hours as needed for severe pain. (Patient not taking: Reported on 01/14/2015) 80 tablet 0  . methocarbamol (ROBAXIN) 500 MG tablet Take 1 tablet (500 mg total) by mouth every 6 (six) hours as needed for muscle spasms. (Patient not taking: Reported on 01/14/2015) 80 tablet 0  . omeprazole (PRILOSEC) 20 MG capsule Take 1 capsule (20 mg total) by mouth 2 (two) times daily before a meal. (Patient not taking: Reported on 01/14/2015) 30 capsule 0  .  oxyCODONE (OXY IR/ROXICODONE) 5 MG immediate release tablet Take 1-2 tablets (5-10 mg total) by mouth every 4 (four) hours as needed for moderate pain. (Patient not taking: Reported on 12/27/2015) 20 tablet 0  . traMADol (ULTRAM) 50 MG tablet Take 1-2 tablets (50-100 mg total) by mouth every 6 (six) hours as needed (mild pain). (Patient not taking: Reported on 01/14/2015) 60 tablet 1   No current facility-administered medications for this visit.    Allergies as of 12/27/2015 - Review Complete 12/27/2015  Allergen Reaction Noted  . Percocet [oxycodone-acetaminophen]  09/20/2014  . Sulfa antibiotics  07/22/2012  . Mobic [meloxicam] Rash 07/23/2012    No family history on file.  Social History   Social History  . Marital Status: Married    Spouse Name: N/A  . Number of Children: N/A  . Years of Education: N/A   Occupational History  . Not on file.   Social History Main Topics  . Smoking status: Never Smoker   . Smokeless tobacco: Never Used  . Alcohol Use: No  . Drug Use: No  . Sexual Activity: Not on file   Other Topics Concern  . Not on file   Social History Narrative    Review of Systems: General: Negative for anorexia, weight loss, fever, chills, fatigue, weakness. Eyes: Negative for vision changes.  ENT: Negative for hoarseness, nasal congestion. CV: Negative for chest pain, angina, palpitations, peripheral edema.  Respiratory: Negative for dyspnea at rest, cough, sputum, wheezing.  GI: See history of present illness. Derm: Negative for rash or itching.  Endo: Negative for unusual weight change.  Heme: Negative for bruising or bleeding. Allergy: Negative for rash or hives.    Physical Exam: BP 136/72 mmHg  Pulse 63  Temp(Src) 97.3 F (36.3 C) (Oral)  Ht 5\' 11"  (1.803 m)  Wt 235 lb 9.6 oz (106.867 kg)  BMI 32.87 kg/m2 General:   Alert and oriented. Pleasant and cooperative. Well-nourished and well-developed.  Head:  Normocephalic and atraumatic. Eyes:   Without icterus, sclera clear and conjunctiva pink.  Ears:  Normal auditory acuity. Cardiovascular:  S1, S2 present without murmurs appreciated.Extremities without clubbing or edema. Respiratory:  Clear to auscultation bilaterally. No wheezes, rales, or rhonchi. No distress.  Gastrointestinal:  +BS, rounded but soft, non-tender and non-distended. No HSM noted. No guarding or rebound. No masses appreciated.  Rectal:  Deferred  Neurologic:  Alert and oriented x4;  grossly normal neurologically. Psych:  Alert and cooperative. Normal mood and affect. Heme/Lymph/Immune: No excessive bruising noted.    12/27/2015 1:32 PM

## 2016-01-05 NOTE — Discharge Instructions (Signed)
°  Colonoscopy Discharge Instructions  Read the instructions outlined below and refer to this sheet in the next few weeks. These discharge instructions provide you with general information on caring for yourself after you leave the hospital. Your doctor may also give you specific instructions. While your treatment has been planned according to the most current medical practices available, unavoidable complications occasionally occur. If you have any problems or questions after discharge, call Dr. Gala Romney at [phone]. ACTIVITY  You may resume your regular activity, but move at a slower pace for the next 24 hours.   Take frequent rest periods for the next 24 hours.   Walking will help get rid of the air and reduce the bloated feeling in your belly (abdomen).   No driving for 24 hours (because of the medicine (anesthesia) used during the test).    Do not sign any important legal documents or operate any machinery for 24 hours (because of the anesthesia used during the test).  NUTRITION  Drink plenty of fluids.   You may resume your normal diet as instructed by your doctor.   Begin with a light meal and progress to your normal diet. Heavy or fried foods are harder to digest and may make you feel sick to your stomach (nauseated).   Avoid alcoholic beverages for 24 hours or as instructed.  MEDICATIONS  You may resume your normal medications unless your doctor tells you otherwise.  WHAT YOU CAN EXPECT TODAY  Some feelings of bloating in the abdomen.   Passage of more gas than usual.   Spotting of blood in your stool or on the toilet paper.  IF YOU HAD POLYPS REMOVED DURING THE COLONOSCOPY:  No aspirin products for 7 days or as instructed.   No alcohol for 7 days or as instructed.   Eat a soft diet for the next 24 hours.  FINDING OUT THE RESULTS OF YOUR TEST Not all test results are available during your visit. If your test results are not back during the visit, make an appointment  with your caregiver to find out the results. Do not assume everything is normal if you have not heard from your caregiver or the medical facility. It is important for you to follow up on all of your test results.  SEEK IMMEDIATE MEDICAL ATTENTION IF:  You have more than a spotting of blood in your stool.   Your belly is swollen (abdominal distention).   You are nauseated or vomiting.   You have a temperature over 101.   You have abdominal pain or discomfort that is severe or gets worse throughout the day.    Recommend 1 more colonoscopy in 5 years

## 2016-01-09 ENCOUNTER — Encounter (HOSPITAL_COMMUNITY): Payer: Self-pay | Admitting: Internal Medicine

## 2016-02-03 ENCOUNTER — Encounter: Payer: Self-pay | Admitting: Internal Medicine

## 2016-02-03 ENCOUNTER — Ambulatory Visit (INDEPENDENT_AMBULATORY_CARE_PROVIDER_SITE_OTHER): Payer: Medicare Other | Admitting: Internal Medicine

## 2016-02-03 VITALS — BP 142/79 | HR 61 | Temp 97.2°F | Ht 70.0 in | Wt 230.4 lb

## 2016-02-03 DIAGNOSIS — K5900 Constipation, unspecified: Secondary | ICD-10-CM | POA: Diagnosis not present

## 2016-02-03 DIAGNOSIS — K641 Second degree hemorrhoids: Secondary | ICD-10-CM | POA: Diagnosis not present

## 2016-02-03 NOTE — Patient Instructions (Signed)
Begin Benefiber one tablespoon twice daily  Continue Colace daily  Use Miralax as needed  Avoid straining.  Limit toilet time to 2-5 minutes  Call with any interim problems  Schedule followup appointment as needed

## 2016-02-03 NOTE — Progress Notes (Signed)
Primary Care Physician:  Purvis Kilts, MD Primary Gastroenterologist:  Dr. Gala Romney  Pre-Procedure History & Physical: HPI:  Nicholas Lewis is a 75 y.o. male here for further management constipation and hemorrhoids. He has some difficulty having a bowel movement; better lately. Some straining -  takes Colace; has MiraLax at home but doesn't take it. Recent colonoscopy negative. Sometimes notes a hemorrhoid protruding - goes back in on its on. Occasional paper hematochezia. Since colonoscopy, however, hemorrhoids have settled down. History of colonic adenoma and positive family history of colon cancer; surveillance examination slated in 5 years.  Past Medical History  Diagnosis Date  . Complication of anesthesia     slow to wake up  . Hypertension   . Arthritis   . Gout   . Abrasion     l arm  . Cellulitis 07/2012    after surgery for left knee  . Cancer ([place])     skin  . Pneumonia     hx of 2015   . Dog scratch 01/14/2015     office visit note from PCP on chart( to lower lip)     Past Surgical History  Procedure Laterality Date  . Knee arthroscopy  2000    left  . Cystectomy  2003    from spine  . Total knee arthroplasty  08/06/2012    Procedure: TOTAL KNEE ARTHROPLASTY;  Surgeon: Magnus Sinning, MD;  Location: [facility];  Service: Orthopedics;  Laterality: Left;  . Total knee arthroplasty Right 09/20/2014    Procedure: RIGHT TOTAL KNEE ARTHROPLASTY;  Surgeon: Gearlean Alf, MD;  Location: [facility];  Service: Orthopedics;  Laterality: Right;  . Joint replacement    . Back surgery    . Thyroid lobectomy Left 01/20/2015    Procedure: LEFT THYROID LOBECTOMY;  Surgeon: Armandina Gemma, MD;  Location: [facility];  Service: General;  Laterality: Left;  . Colonoscopy  11/10/2010    RMR: internal hemorrhoids , anal papilla, otherwise normal rectum.  Long tortuous colon , polyps  in the cecum and sigmoid segment status post removeal as described above.   . Colonoscopy N/A 01/05/2016   Dr.Iza Preston- normal colonoscopy (redundant colon)     Prior to Admission medications   Medication Sig Start Date End Date Taking? Authorizing Provider  allopurinol (ZYLOPRIM) 300 MG tablet Take 300 mg by mouth every morning.   Yes Historical Provider, MD  aspirin EC 81 MG tablet Take 81 mg by mouth daily. Last dose on 01/14/2015 per patient.   Yes Historical Provider, MD  docusate sodium (COLACE) 100 MG capsule Take 200 mg by mouth 2 (two) times daily.   Yes Historical Provider, MD  levothyroxine (SYNTHROID, LEVOTHROID) 100 MCG tablet Take 100 mcg by mouth daily before breakfast.  11/23/15  Yes Historical Provider, MD  metoprolol (LOPRESSOR) 100 MG tablet Take 100 mg by mouth 2 (two) times daily.   Yes Historical Provider, MD  Multiple Vitamin (MULTIVITAMIN WITH MINERALS) TABS tablet Take 1 tablet by mouth daily.   Yes Historical Provider, MD  Saw Palmetto, Serenoa repens, (SAW PALMETTO BERRIES PO) Take 1 capsule by mouth daily.   Yes Historical Provider, MD  polyethylene glycol-electrolytes (NULYTELY/GOLYTELY) 420 G solution Take 4,000 mLs by mouth once. 12/27/15   Carlis Stable, NP    Allergies as of 02/03/2016 - Review Complete 02/03/2016  Allergen Reaction Noted  . Percocet [oxycodone-acetaminophen]  09/20/2014  . Sulfa antibiotics  07/22/2012  . Mobic [meloxicam] Rash 07/23/2012  Family History  Problem Relation Age of Onset  . Colon cancer Father 54    Social History   Social History  . Marital Status: Married    Spouse Name: N/A  . Number of Children: N/A  . Years of Education: N/A   Occupational History  . Not on file.   Social History Main Topics  . Smoking status: Never Smoker   . Smokeless tobacco: Never Used  . Alcohol Use: No  . Drug Use: No  . Sexual Activity: Not on file   Other Topics Concern  . Not on file   Social History Narrative    Review of Systems: See HPI, otherwise negative ROS  Physical Exam: BP 142/79 mmHg  Pulse 61  Temp(Src) 97.2 F  (36.2 C)  Ht 5\' 10"  (1.778 m)  Wt 230 lb 6.4 oz (104.509 kg)  BMI 33.06 kg/m2 General:   Alert,  Well-developed, well-nourished, pleasant and cooperative in NAD   Impression:  75 year old gentleman history of colonic adenoma;  recent colonoscopy demonstrated no recurrent polyps. Patient with intermittent irritation, bleeding and protrusion from hemorrhoids. Doing better now  -  back on Colace. Does not take anything else. Spends too much time on the toilet.  Currently asymptomatic;  No hemorrhoid banding needed at this point in time..  cussed important issues and importance of constipation some detail and   Recommendations: Begin Benefiber one tablespoon twice daily; Continue Colace daily  Use Miralax as needed  Avoid straining.  Limit toilet time to 2-5 minutes  Call with any interim problems  Schedule follow-up appointment as needed      Notice: This dictation was prepared with Dragon dictation along with smaller phrase technology. Any transcriptional errors that result from this process are unintentional and may not be corrected upon review.

## 2016-03-08 ENCOUNTER — Telehealth: Payer: Self-pay | Admitting: General Practice

## 2016-03-08 NOTE — Telephone Encounter (Signed)
I spoke with the patient in regards to his billing question for dos: 12/27/2015 and made him aware we will resubmit the bill to Medicare through our billing office.

## 2016-03-19 DIAGNOSIS — J069 Acute upper respiratory infection, unspecified: Secondary | ICD-10-CM | POA: Diagnosis not present

## 2016-03-19 DIAGNOSIS — J209 Acute bronchitis, unspecified: Secondary | ICD-10-CM | POA: Diagnosis not present

## 2016-03-19 DIAGNOSIS — J309 Allergic rhinitis, unspecified: Secondary | ICD-10-CM | POA: Diagnosis not present

## 2016-03-21 DIAGNOSIS — Z6835 Body mass index (BMI) 35.0-35.9, adult: Secondary | ICD-10-CM | POA: Diagnosis not present

## 2016-03-21 DIAGNOSIS — R6889 Other general symptoms and signs: Secondary | ICD-10-CM | POA: Diagnosis not present

## 2016-03-21 DIAGNOSIS — B349 Viral infection, unspecified: Secondary | ICD-10-CM | POA: Diagnosis not present

## 2016-03-21 DIAGNOSIS — Z1389 Encounter for screening for other disorder: Secondary | ICD-10-CM | POA: Diagnosis not present

## 2016-03-21 DIAGNOSIS — J101 Influenza due to other identified influenza virus with other respiratory manifestations: Secondary | ICD-10-CM | POA: Diagnosis not present

## 2016-04-24 DIAGNOSIS — Z1389 Encounter for screening for other disorder: Secondary | ICD-10-CM | POA: Diagnosis not present

## 2016-04-24 DIAGNOSIS — Z6835 Body mass index (BMI) 35.0-35.9, adult: Secondary | ICD-10-CM | POA: Diagnosis not present

## 2016-04-24 DIAGNOSIS — R05 Cough: Secondary | ICD-10-CM | POA: Diagnosis not present

## 2016-04-24 DIAGNOSIS — D696 Thrombocytopenia, unspecified: Secondary | ICD-10-CM | POA: Diagnosis not present

## 2016-04-24 DIAGNOSIS — K219 Gastro-esophageal reflux disease without esophagitis: Secondary | ICD-10-CM | POA: Diagnosis not present

## 2016-04-24 DIAGNOSIS — M542 Cervicalgia: Secondary | ICD-10-CM | POA: Diagnosis not present

## 2016-04-24 DIAGNOSIS — E041 Nontoxic single thyroid nodule: Secondary | ICD-10-CM | POA: Diagnosis not present

## 2016-05-01 DIAGNOSIS — R05 Cough: Secondary | ICD-10-CM | POA: Diagnosis not present

## 2016-05-01 DIAGNOSIS — E041 Nontoxic single thyroid nodule: Secondary | ICD-10-CM | POA: Diagnosis not present

## 2016-05-01 DIAGNOSIS — M40202 Unspecified kyphosis, cervical region: Secondary | ICD-10-CM | POA: Diagnosis not present

## 2016-05-01 DIAGNOSIS — E890 Postprocedural hypothyroidism: Secondary | ICD-10-CM | POA: Diagnosis not present

## 2016-05-01 DIAGNOSIS — M47812 Spondylosis without myelopathy or radiculopathy, cervical region: Secondary | ICD-10-CM | POA: Diagnosis not present

## 2016-05-29 DIAGNOSIS — R05 Cough: Secondary | ICD-10-CM | POA: Diagnosis not present

## 2016-05-29 DIAGNOSIS — R490 Dysphonia: Secondary | ICD-10-CM | POA: Diagnosis not present

## 2016-06-05 DIAGNOSIS — E041 Nontoxic single thyroid nodule: Secondary | ICD-10-CM | POA: Diagnosis not present

## 2016-06-26 DIAGNOSIS — R05 Cough: Secondary | ICD-10-CM | POA: Diagnosis not present

## 2016-06-26 DIAGNOSIS — R490 Dysphonia: Secondary | ICD-10-CM | POA: Diagnosis not present

## 2016-08-21 DIAGNOSIS — Z6835 Body mass index (BMI) 35.0-35.9, adult: Secondary | ICD-10-CM | POA: Diagnosis not present

## 2016-08-21 DIAGNOSIS — Z1389 Encounter for screening for other disorder: Secondary | ICD-10-CM | POA: Diagnosis not present

## 2016-08-21 DIAGNOSIS — M5410 Radiculopathy, site unspecified: Secondary | ICD-10-CM | POA: Diagnosis not present

## 2016-08-21 DIAGNOSIS — M5431 Sciatica, right side: Secondary | ICD-10-CM | POA: Diagnosis not present

## 2016-08-21 DIAGNOSIS — E6609 Other obesity due to excess calories: Secondary | ICD-10-CM | POA: Diagnosis not present

## 2016-11-28 DIAGNOSIS — E782 Mixed hyperlipidemia: Secondary | ICD-10-CM | POA: Diagnosis not present

## 2016-11-28 DIAGNOSIS — E6609 Other obesity due to excess calories: Secondary | ICD-10-CM | POA: Diagnosis not present

## 2016-11-28 DIAGNOSIS — Z1389 Encounter for screening for other disorder: Secondary | ICD-10-CM | POA: Diagnosis not present

## 2016-11-28 DIAGNOSIS — Z0000 Encounter for general adult medical examination without abnormal findings: Secondary | ICD-10-CM | POA: Diagnosis not present

## 2016-11-28 DIAGNOSIS — E039 Hypothyroidism, unspecified: Secondary | ICD-10-CM | POA: Diagnosis not present

## 2016-11-28 DIAGNOSIS — Z6836 Body mass index (BMI) 36.0-36.9, adult: Secondary | ICD-10-CM | POA: Diagnosis not present

## 2016-11-28 DIAGNOSIS — Z125 Encounter for screening for malignant neoplasm of prostate: Secondary | ICD-10-CM | POA: Diagnosis not present

## 2016-11-28 DIAGNOSIS — E748 Other specified disorders of carbohydrate metabolism: Secondary | ICD-10-CM | POA: Diagnosis not present

## 2016-12-14 DIAGNOSIS — Z1389 Encounter for screening for other disorder: Secondary | ICD-10-CM | POA: Diagnosis not present

## 2016-12-14 DIAGNOSIS — M25551 Pain in right hip: Secondary | ICD-10-CM | POA: Diagnosis not present

## 2016-12-14 DIAGNOSIS — M1611 Unilateral primary osteoarthritis, right hip: Secondary | ICD-10-CM | POA: Diagnosis not present

## 2016-12-14 DIAGNOSIS — Z6837 Body mass index (BMI) 37.0-37.9, adult: Secondary | ICD-10-CM | POA: Diagnosis not present

## 2016-12-18 ENCOUNTER — Emergency Department (HOSPITAL_COMMUNITY): Payer: Medicare Other

## 2016-12-18 ENCOUNTER — Encounter (HOSPITAL_COMMUNITY): Payer: Self-pay

## 2016-12-18 ENCOUNTER — Emergency Department (HOSPITAL_COMMUNITY)
Admission: EM | Admit: 2016-12-18 | Discharge: 2016-12-18 | Disposition: A | Discharge status: Home / Self Care | Payer: Medicare Other | Attending: Emergency Medicine | Admitting: Emergency Medicine

## 2016-12-18 DIAGNOSIS — R51 Headache: Secondary | ICD-10-CM | POA: Diagnosis not present

## 2016-12-18 DIAGNOSIS — Z85828 Personal history of other malignant neoplasm of skin: Secondary | ICD-10-CM | POA: Insufficient documentation

## 2016-12-18 DIAGNOSIS — Z79899 Other long term (current) drug therapy: Secondary | ICD-10-CM | POA: Diagnosis not present

## 2016-12-18 DIAGNOSIS — R001 Bradycardia, unspecified: Secondary | ICD-10-CM | POA: Insufficient documentation

## 2016-12-18 DIAGNOSIS — I10 Essential (primary) hypertension: Secondary | ICD-10-CM | POA: Diagnosis not present

## 2016-12-18 DIAGNOSIS — Z7982 Long term (current) use of aspirin: Secondary | ICD-10-CM | POA: Insufficient documentation

## 2016-12-18 DIAGNOSIS — R519 Headache, unspecified: Secondary | ICD-10-CM

## 2016-12-18 LAB — CBC WITH DIFFERENTIAL/PLATELET
Basophils Absolute: 0 10*3/uL (ref 0.0–0.1)
Basophils Relative: 0 %
Eosinophils Absolute: 0.1 10*3/uL (ref 0.0–0.7)
Eosinophils Relative: 1 %
HCT: 38.4 % — ABNORMAL LOW (ref 39.0–52.0)
Hemoglobin: 13.1 g/dL (ref 13.0–17.0)
Lymphocytes Relative: 38 %
Lymphs Abs: 3.7 10*3/uL (ref 0.7–4.0)
MCH: 31.5 pg (ref 26.0–34.0)
MCHC: 34.1 g/dL (ref 30.0–36.0)
MCV: 92.3 fL (ref 78.0–100.0)
Monocytes Absolute: 0.8 10*3/uL (ref 0.1–1.0)
Monocytes Relative: 8 %
Neutro Abs: 5.1 10*3/uL (ref 1.7–7.7)
Neutrophils Relative %: 53 %
Platelets: 131 10*3/uL — ABNORMAL LOW (ref 150–400)
RBC: 4.16 MIL/uL — ABNORMAL LOW (ref 4.22–5.81)
RDW: 13.3 % (ref 11.5–15.5)
WBC: 9.7 10*3/uL (ref 4.0–10.5)

## 2016-12-18 LAB — BASIC METABOLIC PANEL
Anion gap: 4 — ABNORMAL LOW (ref 5–15)
BUN: 27 mg/dL — ABNORMAL HIGH (ref 6–20)
CO2: 26 mmol/L (ref 22–32)
Calcium: 8.1 mg/dL — ABNORMAL LOW (ref 8.9–10.3)
Chloride: 107 mmol/L (ref 101–111)
Creatinine, Ser: 1.22 mg/dL (ref 0.61–1.24)
GFR calc Af Amer: >60 mL/min (ref >60)
GFR calc non Af Amer: 57 mL/min — ABNORMAL LOW (ref >60)
Glucose, Bld: 90 mg/dL (ref 65–99)
Potassium: 3.3 mmol/L — ABNORMAL LOW (ref 3.5–5.1)
Sodium: 137 mmol/L (ref 135–145)

## 2016-12-18 LAB — TROPONIN I: Troponin I: <0.03 ng/mL (ref <0.03)

## 2016-12-18 MED ORDER — SODIUM CHLORIDE 0.9 % IV BOLUS (SEPSIS)
1000.0000 mL | Freq: Once | INTRAVENOUS | Status: AC
  Administered 2016-12-18: 1000 mL via INTRAVENOUS

## 2016-12-18 MED ORDER — ATROPINE SULFATE 1 MG/10ML IJ SOSY
0.5000 mg | PREFILLED_SYRINGE | Freq: Once | INTRAMUSCULAR | Status: AC
  Administered 2016-12-18: 0.5 mg via INTRAVENOUS
  Filled 2016-12-18: qty 10

## 2016-12-18 NOTE — ED Provider Notes (Signed)
[name]   CSN: [id number] Arrival date & time: 12/18/16  1734     History   Chief Complaint Chief Complaint  Patient presents with  . Headache  . Bradycardia    HPI Nicholas Lewis is a 75 y.o. male.  Pt presents to the ED today with headache and bradycardia.  Pt said his bp was low also.  He is on metoprolol, but has been on the same dose for years.  The headache is across his forehead and dull in nature.  It is not bad enough for tylenol or ibuprofen.  The only new medication is prednisone that he started 3 days ago for hip pain.  Pt denies cp or sob or dizziness.  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  He was just concerned b/c the hr stayed low.      Past Medical History:  Diagnosis Date  . Abrasion    l arm  . Arthritis   . Cancer ([place])    skin  . Cellulitis 07/2012   after surgery for left knee  . Complication of anesthesia    slow to wake up  . Dog scratch 01/14/2015    office visit note from PCP on chart( to lower lip)   . Gout   . Hypertension   . Pneumonia    hx of 2015     Patient Active Problem List   Diagnosis Date Noted  . Family history of colon cancer 12/27/2015  . History of adenomatous polyp of colon 12/27/2015  . Neoplasm of uncertain behavior of thyroid gland 01/20/2015  . Stiffness of joint, lower leg 10/15/2014  . Difficulty walking 10/15/2014  . Weakness of right leg 10/15/2014  . OA (osteoarthritis) of knee 09/20/2014  . Knee stiffness 09/09/2012  . S/P left TKA 08/09/2012    Past Surgical History:  Procedure Laterality Date  . BACK SURGERY    . COLONOSCOPY  11/10/2010   RMR: internal hemorrhoids , anal papilla, otherwise normal rectum.  Long tortuous colon , polyps  in the cecum and sigmoid segment status post removeal as described above.   . COLONOSCOPY N/A 01/05/2016   Dr.Rourk- normal colonoscopy (redundant colon)   . CYSTECTOMY  2003   from spine  . JOINT REPLACEMENT    . KNEE ARTHROSCOPY  2000   left  . THYROID LOBECTOMY Left 01/20/2015   Procedure: LEFT THYROID LOBECTOMY;  Surgeon: Armandina Gemma, MD;  [facility];  Service: General;  Laterality: Left;  . TOTAL KNEE ARTHROPLASTY  08/06/2012   Procedure: TOTAL KNEE ARTHROPLASTY;  Surgeon: Magnus Sinning, MD;  [facility];  Service: Orthopedics;  Laterality: Left;  . TOTAL KNEE ARTHROPLASTY Right 09/20/2014   Procedure: RIGHT TOTAL KNEE ARTHROPLASTY;  Surgeon: Gearlean Alf, MD;  [facility];  Service: Orthopedics;  Laterality: Right;       Home Medications    Prior to Admission medications   Medication Sig Start Date End Date Taking? Authorizing Provider  allopurinol (ZYLOPRIM) 300 MG tablet Take 300 mg by mouth every morning.    Historical Provider, MD  aspirin EC 81 MG tablet Take 81 mg by mouth daily. Last dose on 01/14/2015 per patient.    Historical Provider, MD  docusate sodium (COLACE) 100 MG capsule Take 200 mg by mouth 2 (two) times daily.    Historical Provider, MD  levothyroxine (SYNTHROID, LEVOTHROID) 100  MCG tablet Take 100 mcg by mouth daily before breakfast.  11/23/15   Historical Provider, MD  metoprolol (LOPRESSOR) 100 MG tablet Take 100 mg by mouth 2 (two) times daily.    Historical Provider, MD  Multiple Vitamin (MULTIVITAMIN WITH MINERALS) TABS tablet Take 1 tablet by mouth daily.    Historical Provider, MD  polyethylene glycol-electrolytes (NULYTELY/GOLYTELY) 420 G solution Take 4,000 mLs by mouth once. 12/27/15   Carlis Stable, NP  Saw Palmetto, Serenoa repens, (SAW PALMETTO BERRIES PO) Take 1 capsule by mouth daily.    Historical Provider, MD    Family History Family History  Problem Relation Age of Onset  . Colon cancer Father 67    Social History Social History  Substance Use Topics  . Smoking status: Never Smoker  . Smokeless tobacco: Never Used  . Alcohol use No     Allergies   Percocet [oxycodone-acetaminophen];  Sulfa antibiotics; and Mobic [meloxicam]   Review of Systems Review of Systems  Cardiovascular:       Bradycardia  Neurological: Positive for headaches.  All other systems reviewed and are negative.    Physical Exam Updated Vital Signs BP 118/66   Pulse (!) 47   Temp 97.5 F (36.4 C) (Oral)   Resp 19   Ht 5\' 11"  (1.803 m)   Wt 240 lb (108.9 kg)   SpO2 97%   BMI 33.47 kg/m   Physical Exam  Constitutional: He is oriented to person, place, and time. He appears well-developed and well-nourished.  HENT:  Head: Normocephalic and atraumatic.  Right Ear: External ear normal.  Left Ear: External ear normal.  Nose: Nose normal.  Mouth/Throat: Oropharynx is clear and moist.  Eyes: Conjunctivae and EOM are normal. Pupils are equal, round, and reactive to light.  Neck: Normal range of motion. Neck supple.  Cardiovascular: Regular rhythm, normal heart sounds and intact distal pulses.  Bradycardia present.   Pulmonary/Chest: Effort normal and breath sounds normal.  Abdominal: Soft.  Musculoskeletal: Normal range of motion.  Neurological: He is alert and oriented to person, place, and time.  Skin: Skin is warm and dry.  Psychiatric: He has a normal mood and affect. His behavior is normal. Judgment and thought content normal.  Nursing note and vitals reviewed.    ED Treatments / Results  Labs (all labs ordered are listed, but only abnormal results are displayed) Labs Reviewed  BASIC METABOLIC PANEL - Abnormal; Notable for the following:       Result Value   Potassium 3.3 (*)    BUN 27 (*)    Calcium 8.1 (*)    GFR calc non Af Amer 57 (*)    Anion gap 4 (*)    All other components within normal limits  CBC WITH DIFFERENTIAL/PLATELET - Abnormal; Notable for the following:    RBC 4.16 (*)    HCT 38.4 (*)    Platelets 131 (*)    All other components within normal limits  TROPONIN I    EKG  EKG Interpretation  Date/Time:  Tuesday December 18 2016 17:56:54  EST Ventricular Rate:  47 PR Interval:    QRS Duration: 96 QT Interval:  450 QTC Calculation: 398 R Axis:   -9 Text Interpretation:  Junctional rhythm Low voltage, precordial leads Minimal ST elevation, inferior leads Confirmed by Magalie Almon MD, Divine Imber ([id number]) on 12/18/2016 6:04:03 PM       Radiology Dg Chest 2 View  Result Date: 12/18/2016 CLINICAL DATA:  Hypotension and weakness. Bradycardia. Onset today.  EXAM: CHEST  2 VIEW COMPARISON:  10/04/2014 FINDINGS: There is unchanged mild right hemidiaphragm elevation. The lungs are clear. There is no pleural effusion. The pulmonary vasculature is normal. Hilar, mediastinal and cardiac contours are unremarkable and unchanged. IMPRESSION: No acute cardiopulmonary findings. Electronically Signed   By: Andreas Newport M.D.   On: 12/18/2016 18:41   Ct Head Wo Contrast  Result Date: 12/18/2016 CLINICAL DATA:  75 year old male with a history of headache EXAM: CT HEAD WITHOUT CONTRAST TECHNIQUE: Contiguous axial images were obtained from the base of the skull through the vertex without intravenous contrast. COMPARISON:  02/01/2005 FINDINGS: Brain: No acute intracranial hemorrhage. No midline shift or mass effect. Gray-white differentiation maintained. Unremarkable appearance of the ventricular system. Vascular: Unremarkable. Skull: No acute fracture.  No aggressive bone lesion identified. Sinuses/Orbits: Unremarkable appearance of the orbits. Mastoid air cells clear. No middle ear effusion. No significant sinus disease. Other: None IMPRESSION: No CT evidence of acute intracranial abnormality. Signed, Dulcy Fanny. Earleen Newport, DO [facility] Radiology Electronically Signed   By: Corrie Mckusick D.O.   On: 12/18/2016 18:46    Procedures Procedures (including critical care time)  Medications Ordered in ED Medications  sodium chloride 0.9 % bolus 1,000 mL (1,000 mLs Intravenous New Bag/Given 12/18/16 1820)  atropine  1 MG/10ML injection 0.5 mg (0.5 mg Intravenous Given 12/18/16 1819)     Initial Impression / Assessment and Plan / ED Course  I have reviewed the triage vital signs and the nursing notes.  Pertinent labs & imaging results that were available during my care of the patient were reviewed by me and considered in my medical decision making (see chart for details).  Clinical Course    Pt given 0.5 mg of atropine which increased his HR to the mid to upper 50s.  Pt looks good.  I told him to stop his prednisone and to hold his beta blocker tonight and tomorrow.  He is told to call his doctor in the morning.  Final Clinical Impressions(s) / ED Diagnoses   Final diagnoses:  Bradycardia  Acute nonintractable headache, unspecified headache type    New Prescriptions New Prescriptions   No medications on file     Isla Pence, MD 12/18/16 1919

## 2016-12-18 NOTE — ED Triage Notes (Signed)
Pt reports woke up with headache today and when he laid down his bp was 102/54 and HR 43 with automatic bp cuff.  Pt recently started prednisone dose pack for r hip pain for the past 3 days.    Pt says his heartrate usually runs in the 60's.

## 2016-12-18 NOTE — Discharge Instructions (Signed)
Do not take metoprolol tonight or tomorrow.  Stop prednisone.

## 2016-12-19 DIAGNOSIS — R51 Headache: Secondary | ICD-10-CM | POA: Diagnosis not present

## 2016-12-19 DIAGNOSIS — D6949 Other primary thrombocytopenia: Secondary | ICD-10-CM | POA: Diagnosis not present

## 2016-12-19 DIAGNOSIS — I10 Essential (primary) hypertension: Secondary | ICD-10-CM | POA: Diagnosis not present

## 2016-12-19 DIAGNOSIS — L821 Other seborrheic keratosis: Secondary | ICD-10-CM | POA: Diagnosis not present

## 2016-12-19 DIAGNOSIS — Z1389 Encounter for screening for other disorder: Secondary | ICD-10-CM | POA: Diagnosis not present

## 2016-12-19 DIAGNOSIS — M1991 Primary osteoarthritis, unspecified site: Secondary | ICD-10-CM | POA: Diagnosis not present

## 2016-12-19 DIAGNOSIS — D229 Melanocytic nevi, unspecified: Secondary | ICD-10-CM | POA: Diagnosis not present

## 2016-12-19 DIAGNOSIS — Z6838 Body mass index (BMI) 38.0-38.9, adult: Secondary | ICD-10-CM | POA: Diagnosis not present

## 2016-12-19 DIAGNOSIS — Z85828 Personal history of other malignant neoplasm of skin: Secondary | ICD-10-CM | POA: Diagnosis not present

## 2016-12-19 DIAGNOSIS — R001 Bradycardia, unspecified: Secondary | ICD-10-CM | POA: Diagnosis not present

## 2016-12-19 DIAGNOSIS — I498 Other specified cardiac arrhythmias: Secondary | ICD-10-CM | POA: Diagnosis not present

## 2016-12-19 DIAGNOSIS — T50905S Adverse effect of unspecified drugs, medicaments and biological substances, sequela: Secondary | ICD-10-CM | POA: Diagnosis not present

## 2016-12-21 ENCOUNTER — Other Ambulatory Visit (HOSPITAL_COMMUNITY): Payer: Self-pay | Admitting: Family Medicine

## 2016-12-21 DIAGNOSIS — M25551 Pain in right hip: Secondary | ICD-10-CM

## 2016-12-27 ENCOUNTER — Ambulatory Visit (HOSPITAL_COMMUNITY)
Admission: RE | Admit: 2016-12-27 | Discharge: 2016-12-27 | Disposition: A | Discharge status: Home / Self Care | Payer: Medicare Other | Source: Ambulatory Visit | Attending: Family Medicine | Admitting: Family Medicine

## 2016-12-27 DIAGNOSIS — M25551 Pain in right hip: Secondary | ICD-10-CM | POA: Diagnosis not present

## 2016-12-27 DIAGNOSIS — X58XXXA Exposure to other specified factors, initial encounter: Secondary | ICD-10-CM | POA: Insufficient documentation

## 2016-12-27 DIAGNOSIS — T148XXA Other injury of unspecified body region, initial encounter: Secondary | ICD-10-CM | POA: Insufficient documentation

## 2017-01-01 DIAGNOSIS — Z1389 Encounter for screening for other disorder: Secondary | ICD-10-CM | POA: Diagnosis not present

## 2017-01-01 DIAGNOSIS — I10 Essential (primary) hypertension: Secondary | ICD-10-CM | POA: Diagnosis not present

## 2017-01-01 DIAGNOSIS — Z6837 Body mass index (BMI) 37.0-37.9, adult: Secondary | ICD-10-CM | POA: Diagnosis not present

## 2017-01-17 DIAGNOSIS — Z471 Aftercare following joint replacement surgery: Secondary | ICD-10-CM | POA: Diagnosis not present

## 2017-01-17 DIAGNOSIS — Z96651 Presence of right artificial knee joint: Secondary | ICD-10-CM | POA: Diagnosis not present

## 2017-01-17 DIAGNOSIS — Z6836 Body mass index (BMI) 36.0-36.9, adult: Secondary | ICD-10-CM | POA: Diagnosis not present

## 2017-01-17 DIAGNOSIS — E6609 Other obesity due to excess calories: Secondary | ICD-10-CM | POA: Diagnosis not present

## 2017-01-17 DIAGNOSIS — Z96652 Presence of left artificial knee joint: Secondary | ICD-10-CM | POA: Diagnosis not present

## 2017-01-17 DIAGNOSIS — M25551 Pain in right hip: Secondary | ICD-10-CM | POA: Diagnosis not present

## 2017-01-17 DIAGNOSIS — Z96653 Presence of artificial knee joint, bilateral: Secondary | ICD-10-CM | POA: Diagnosis not present

## 2017-01-17 DIAGNOSIS — Z1389 Encounter for screening for other disorder: Secondary | ICD-10-CM | POA: Diagnosis not present

## 2017-01-17 DIAGNOSIS — R000 Tachycardia, unspecified: Secondary | ICD-10-CM | POA: Diagnosis not present

## 2017-01-31 NOTE — Progress Notes (Signed)
Cardiology Office Note   Date:  02/04/2017   ID:  Nicholas Lewis, DOB 1941/09/07, MRN [id number]  PCP:  Purvis Kilts, MD  Cardiologist:   Jenkins Rouge, MD   No chief complaint on file.     History of Present Illness: Nicholas Lewis is a 76 y.o. male who presents for bradycardia and preop clearance.  Chronic HTN On beta blocker Seen in ER 12/18/16 Headache on prednisone for hip pain. Worked on farm all day No chest pain, dyspnea or syncope BP noted 118/66 pulse 47 Dose of lopressor had not been changed K 3.3  Troponin negative  LDL 83 Not sure why but given atropine and noted HR increased to 55  Told to stop beta blocker   ECG:  Reported as junctional but clearly SB rate 47 otherwise normal  Previous ECG;s 2016 SB rates in mid 50's his baseline on metoprolol  Since ER atypical right sided pulling in chest ? Related to work on farm Not always exertional Lasts minutes Some postural symptoms with Hyzaar  Past Medical History:  Diagnosis Date  . Abrasion    l arm  . Arthritis   . Cancer ([place])    skin  . Cellulitis 07/2012   after surgery for left knee  . Complication of anesthesia    slow to wake up  . Dog scratch 01/14/2015    office visit note from PCP on chart( to lower lip)   . Gout   . Hypertension   . Pneumonia    hx of 2015     Past Surgical History:  Procedure Laterality Date  . BACK SURGERY    . COLONOSCOPY  11/10/2010   RMR: internal hemorrhoids , anal papilla, otherwise normal rectum.  Long tortuous colon , polyps  in the cecum and sigmoid segment status post removeal as described above.   . COLONOSCOPY N/A 01/05/2016   Dr.Rourk- normal colonoscopy (redundant colon)   . CYSTECTOMY  2003   from spine  . JOINT REPLACEMENT    . KNEE ARTHROSCOPY  2000   left  . THYROID LOBECTOMY Left 01/20/2015   Procedure: LEFT THYROID LOBECTOMY;  Surgeon: Armandina Gemma, MD;  [facility];  Service: General;  Laterality: Left;  . TOTAL KNEE ARTHROPLASTY   08/06/2012   Procedure: TOTAL KNEE ARTHROPLASTY;  Surgeon: Magnus Sinning, MD;  [facility];  Service: Orthopedics;  Laterality: Left;  . TOTAL KNEE ARTHROPLASTY Right 09/20/2014   Procedure: RIGHT TOTAL KNEE ARTHROPLASTY;  Surgeon: Gearlean Alf, MD;  Location: [facility];  Service: Orthopedics;  Laterality: Right;     Current Outpatient Prescriptions  Medication Sig Dispense Refill  . allopurinol (ZYLOPRIM) 300 MG tablet Take 300 mg by mouth every morning.    Marland Kitchen aspirin EC 81 MG tablet Take 81 mg by mouth daily. Last dose on 01/14/2015 per patient.    . docusate sodium (COLACE) 100 MG capsule Take 200 mg by mouth 2 (two) times daily.    Marland Kitchen levothyroxine (SYNTHROID, LEVOTHROID) 112 MCG tablet   2  . losartan-hydrochlorothiazide (HYZAAR) 100-25 MG tablet   0  . metoprolol tartrate (LOPRESSOR) 25 MG tablet   2  . Multiple Vitamin (MULTIVITAMIN WITH MINERALS) TABS tablet Take 1 tablet by mouth daily.     No current facility-administered medications for this visit.     Allergies:   Percocet [oxycodone-acetaminophen]; Sulfa antibiotics; and Mobic [meloxicam]    Social History:  The patient  reports that he has never smoked.  He has never used smokeless tobacco. He reports that he does not drink alcohol or use drugs.   Family History:  The patient's family history includes Colon cancer (age of onset: 19) in his father.    ROS:  Please see the history of present illness.   Otherwise, review of systems are positive for none.   All other systems are reviewed and negative.    PHYSICAL EXAM: VS:  BP 136/74   Pulse 79   Ht 5\' 11"  (1.803 m)   Wt 239 lb (108.4 kg)   SpO2 98%   BMI 33.33 kg/m  , BMI Body mass index is 33.33 kg/m. Affect appropriate Healthy:  appears stated age 70: normal Neck supple with no adenopathy JVP normal no bruits no thyromegaly Lungs clear with no wheezing and good diaphragmatic motion Heart:  S1/S2 no murmur, no rub, gallop or click PMI normal Abdomen:  benighn, BS positve, no tenderness, no AAA no bruit.  No HSM or HJR Distal pulses intact with no bruits No edema Neuro non-focal Skin warm and dry No muscular weakness    EKG:  12/20/16  SB rate 47 otherwise normal read as junctional but not    Recent Labs: 12/18/2016: BUN 27; Creatinine, Ser 1.22; Hemoglobin 13.1; Platelets 131; Potassium 3.3; Sodium 137    Lipid Panel No results found for: CHOL, TRIG, HDL, CHOLHDL, VLDL, LDLCALC, LDLDIRECT    Wt Readings from Last 3 Encounters:  02/04/17 239 lb (108.4 kg)  12/18/16 240 lb (108.9 kg)  02/03/16 230 lb 6.4 oz (104.5 kg)      Other studies Reviewed: Additional studies/ records that were reviewed today include: Notes Dr Gerarda Fraction, ER visit labs ECG;s .    ASSESSMENT AND PLAN:  1.  Bradycardia  Resolved related to too high dose of beta blocker no need for pacer ECG ok 2. Chest pain: atypical right sided related to farm work f/u exercise myovue 3. Hip Pain: right sided seen by Alusio MRI ok bursitis Rx with NSAI's  4. HTN:  Consider decreasing HCTZ to 12.5 mg due to postural symptoms f/u Roque Lias    Current medicines are reviewed at length with the patient today.  The patient does not have concerns regarding medicines.  The following changes have been made:  no change  Labs/ tests ordered today include: Ex Myovue   Orders Placed This Encounter  Procedures  . NM Myocar Multi W/Spect W/Wall Motion / EF     Disposition:   FU with cards PRN      Signed, Jenkins Rouge, MD  02/04/2017 9:00 [facility] [address], [place], [place]  [address] Phone: [phone]; Fax: [phone]

## 2017-02-04 ENCOUNTER — Encounter: Payer: Self-pay | Admitting: *Deleted

## 2017-02-04 ENCOUNTER — Ambulatory Visit (INDEPENDENT_AMBULATORY_CARE_PROVIDER_SITE_OTHER): Payer: Medicare Other | Admitting: Cardiovascular Disease

## 2017-02-04 ENCOUNTER — Encounter: Payer: Self-pay | Admitting: Cardiovascular Disease

## 2017-02-04 VITALS — BP 136/74 | HR 79 | Ht 71.0 in | Wt 239.0 lb

## 2017-02-04 DIAGNOSIS — R072 Precordial pain: Secondary | ICD-10-CM

## 2017-02-04 NOTE — Patient Instructions (Signed)
Your physician recommends that you schedule a follow-up appointment in: AS NEEDED   Your physician recommends that you continue on your current medications as directed. Please refer to the Current Medication list given to you today.  Your physician has requested that you have en exercise stress myoview. For further information please visit [email or web address]. Please follow instruction sheet, as given.  If you need a refill on your cardiac medications before your next appointment, please call your pharmacy.  Thank you for choosing [facility]!

## 2017-02-11 ENCOUNTER — Encounter (HOSPITAL_COMMUNITY)
Admission: RE | Admit: 2017-02-11 | Discharge: 2017-02-11 | Disposition: A | Discharge status: Home / Self Care | Payer: Medicare Other | Source: Ambulatory Visit | Attending: Cardiovascular Disease | Admitting: Cardiovascular Disease

## 2017-02-11 ENCOUNTER — Inpatient Hospital Stay (HOSPITAL_COMMUNITY): Admission: RE | Admit: 2017-02-11 | Payer: Medicare Other | Source: Ambulatory Visit

## 2017-02-11 ENCOUNTER — Encounter (HOSPITAL_COMMUNITY): Payer: Self-pay

## 2017-02-11 DIAGNOSIS — R072 Precordial pain: Secondary | ICD-10-CM | POA: Diagnosis not present

## 2017-02-11 LAB — NM MYOCAR MULTI W/SPECT W/WALL MOTION / EF
Estimated workload: 7 METS
Exercise duration (min): 5 min
Exercise duration (sec): 59 s
LV dias vol: 68 mL (ref 62–150)
LV sys vol: 28 mL
MPHR: 146 {beats}/min
Peak HR: 137 {beats}/min
Percent HR: 93 %
RATE: 0.37
RPE: 14
Rest HR: 75 {beats}/min
SDS: 0
SRS: 0
SSS: 0
TID: 1.16

## 2017-02-11 MED ORDER — SODIUM CHLORIDE 0.9% FLUSH
INTRAVENOUS | Status: AC
  Administered 2017-02-11: 10 mL via INTRAVENOUS
  Filled 2017-02-11: qty 10

## 2017-02-11 MED ORDER — TECHNETIUM TC 99M TETROFOSMIN IV KIT
30.0000 | PACK | Freq: Once | INTRAVENOUS | Status: AC | PRN
  Administered 2017-02-11: 29.8 via INTRAVENOUS

## 2017-02-11 MED ORDER — REGADENOSON 0.4 MG/5ML IV SOLN
INTRAVENOUS | Status: AC
  Filled 2017-02-11: qty 5

## 2017-02-11 MED ORDER — TECHNETIUM TC 99M TETROFOSMIN IV KIT
10.0000 | PACK | Freq: Once | INTRAVENOUS | Status: AC | PRN
  Administered 2017-02-11: 9.5 via INTRAVENOUS

## 2017-03-14 DIAGNOSIS — Z6836 Body mass index (BMI) 36.0-36.9, adult: Secondary | ICD-10-CM | POA: Diagnosis not present

## 2017-03-14 DIAGNOSIS — J209 Acute bronchitis, unspecified: Secondary | ICD-10-CM | POA: Diagnosis not present

## 2017-03-14 DIAGNOSIS — H6121 Impacted cerumen, right ear: Secondary | ICD-10-CM | POA: Diagnosis not present

## 2017-03-22 DIAGNOSIS — I708 Atherosclerosis of other arteries: Secondary | ICD-10-CM | POA: Diagnosis not present

## 2017-03-22 DIAGNOSIS — H25013 Cortical age-related cataract, bilateral: Secondary | ICD-10-CM | POA: Diagnosis not present

## 2017-03-22 DIAGNOSIS — H2513 Age-related nuclear cataract, bilateral: Secondary | ICD-10-CM | POA: Diagnosis not present

## 2017-03-22 DIAGNOSIS — H35033 Hypertensive retinopathy, bilateral: Secondary | ICD-10-CM | POA: Diagnosis not present

## 2017-09-09 DIAGNOSIS — M170 Bilateral primary osteoarthritis of knee: Secondary | ICD-10-CM | POA: Diagnosis not present

## 2017-09-09 DIAGNOSIS — M25551 Pain in right hip: Secondary | ICD-10-CM | POA: Diagnosis not present

## 2017-09-20 DIAGNOSIS — M25551 Pain in right hip: Secondary | ICD-10-CM | POA: Diagnosis not present

## 2017-09-25 DIAGNOSIS — M25551 Pain in right hip: Secondary | ICD-10-CM | POA: Diagnosis not present

## 2017-09-27 DIAGNOSIS — M25551 Pain in right hip: Secondary | ICD-10-CM | POA: Diagnosis not present

## 2017-10-01 DIAGNOSIS — M25551 Pain in right hip: Secondary | ICD-10-CM | POA: Diagnosis not present

## 2017-10-01 DIAGNOSIS — E6609 Other obesity due to excess calories: Secondary | ICD-10-CM | POA: Diagnosis not present

## 2017-10-01 DIAGNOSIS — Z6835 Body mass index (BMI) 35.0-35.9, adult: Secondary | ICD-10-CM | POA: Diagnosis not present

## 2017-10-01 DIAGNOSIS — Z1389 Encounter for screening for other disorder: Secondary | ICD-10-CM | POA: Diagnosis not present

## 2017-10-01 DIAGNOSIS — R05 Cough: Secondary | ICD-10-CM | POA: Diagnosis not present

## 2017-10-01 DIAGNOSIS — Z23 Encounter for immunization: Secondary | ICD-10-CM | POA: Diagnosis not present

## 2017-10-03 DIAGNOSIS — M25551 Pain in right hip: Secondary | ICD-10-CM | POA: Diagnosis not present

## 2017-10-08 DIAGNOSIS — M25551 Pain in right hip: Secondary | ICD-10-CM | POA: Diagnosis not present

## 2017-10-09 DIAGNOSIS — M25551 Pain in right hip: Secondary | ICD-10-CM | POA: Diagnosis not present

## 2017-10-15 DIAGNOSIS — M25551 Pain in right hip: Secondary | ICD-10-CM | POA: Diagnosis not present

## 2017-10-17 DIAGNOSIS — M5431 Sciatica, right side: Secondary | ICD-10-CM | POA: Diagnosis not present

## 2017-10-17 DIAGNOSIS — E890 Postprocedural hypothyroidism: Secondary | ICD-10-CM | POA: Diagnosis not present

## 2017-10-17 DIAGNOSIS — R05 Cough: Secondary | ICD-10-CM | POA: Diagnosis not present

## 2017-10-17 DIAGNOSIS — Z6836 Body mass index (BMI) 36.0-36.9, adult: Secondary | ICD-10-CM | POA: Diagnosis not present

## 2017-10-17 DIAGNOSIS — E6609 Other obesity due to excess calories: Secondary | ICD-10-CM | POA: Diagnosis not present

## 2017-11-07 DIAGNOSIS — M25551 Pain in right hip: Secondary | ICD-10-CM | POA: Diagnosis not present

## 2017-11-07 DIAGNOSIS — M76891 Other specified enthesopathies of right lower limb, excluding foot: Secondary | ICD-10-CM | POA: Diagnosis not present

## 2017-11-13 DIAGNOSIS — M5416 Radiculopathy, lumbar region: Secondary | ICD-10-CM | POA: Diagnosis not present

## 2017-11-13 DIAGNOSIS — M25551 Pain in right hip: Secondary | ICD-10-CM | POA: Diagnosis not present

## 2017-11-26 IMAGING — CT CT HEAD W/O CM
3 series · 16 of 47 positions shown, 19 images · non-contrast
Comparison: 02/01/2005

CLINICAL DATA: 75-year-old male with a history of headache

EXAM:
CT HEAD WITHOUT CONTRAST
TECHNIQUE: Contiguous axial images were obtained from the base of the skull
through the vertex without intravenous contrast.

[Series 2: head wo · axial · 0.43mm/px · z∈[+76,+211]mm · 10 of 33 slices shown, 13 images]
[im 3/33  brain]
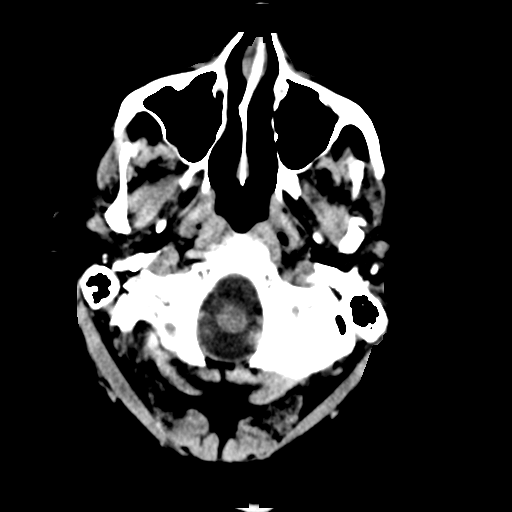
[im 3/33  bone]
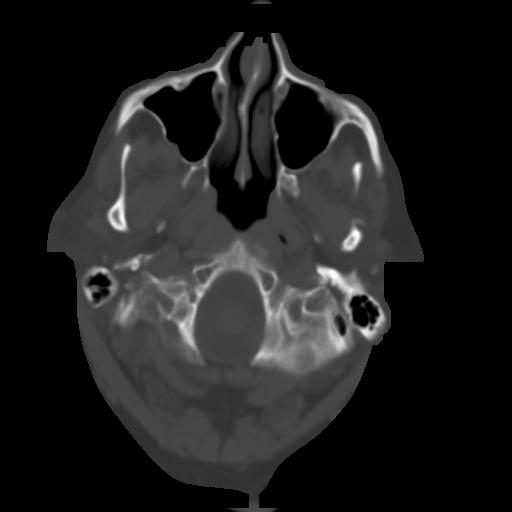
[im 6/33  brain]
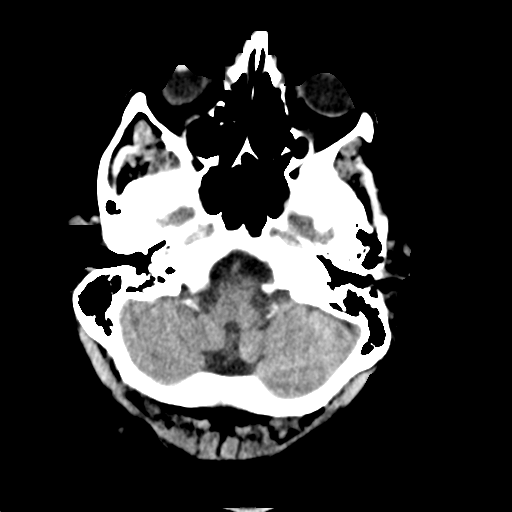
[im 9/33  brain]
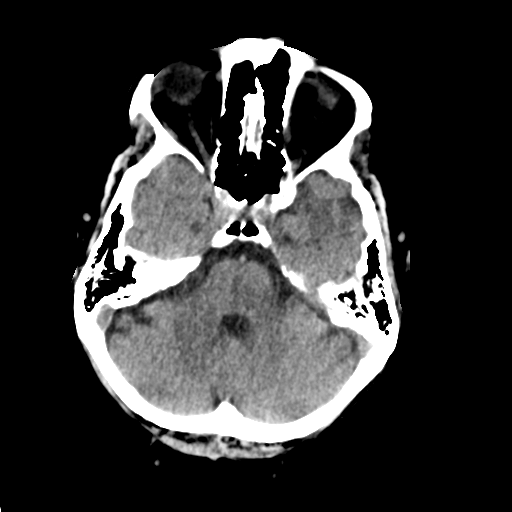
[im 12/33  brain]
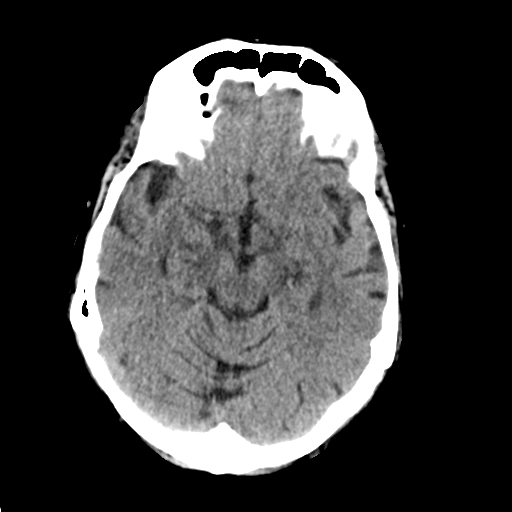
[im 15/33  brain]
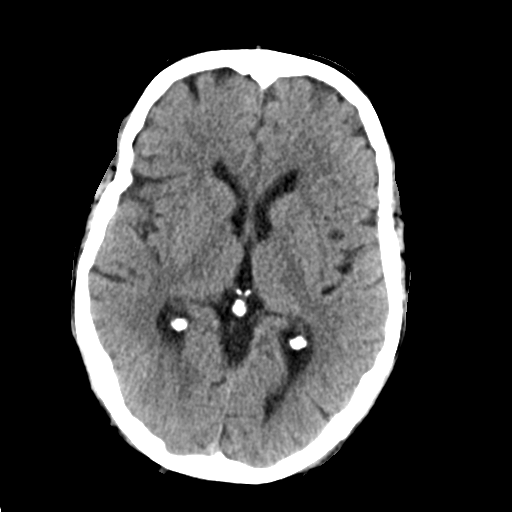
[im 15/33  bone]
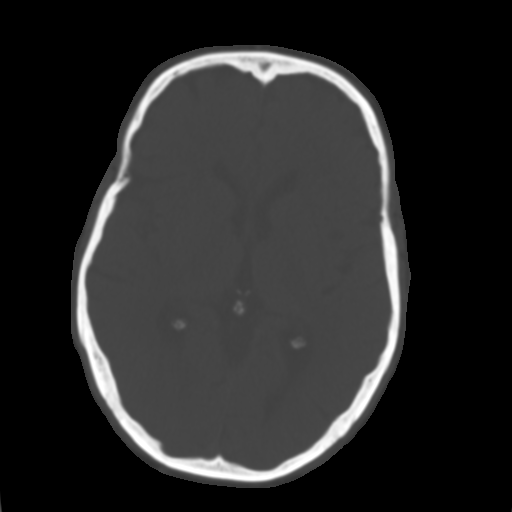
[im 18/33  brain]
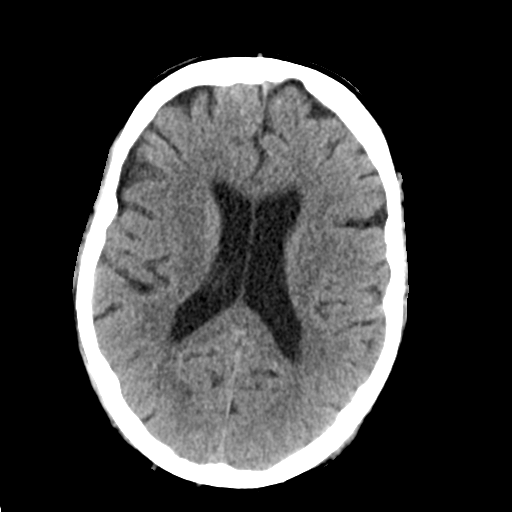
[im 21/33  brain]
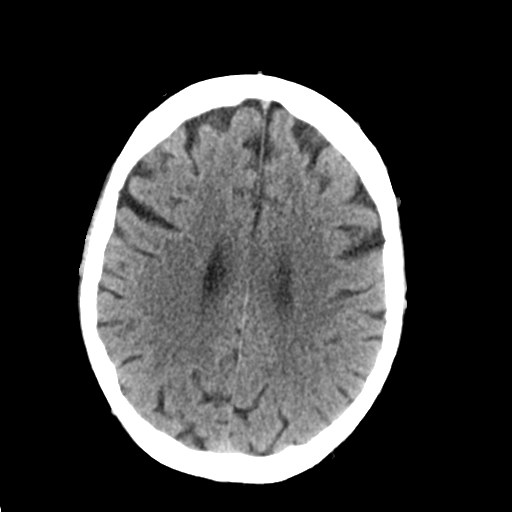
[im 25/33  brain]
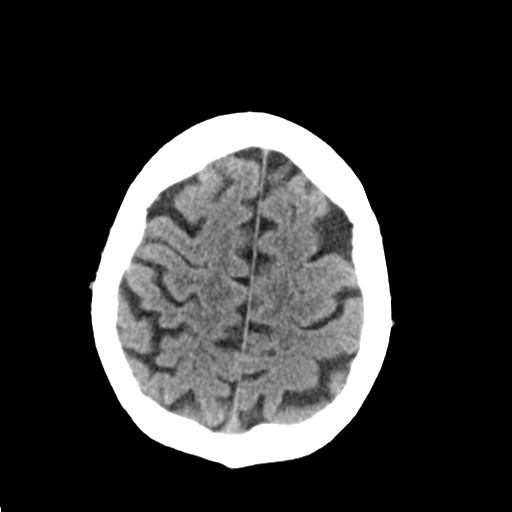
[im 27/33  brain]
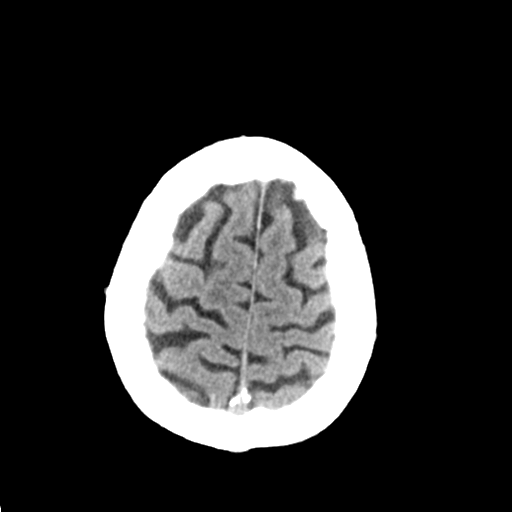
[im 27/33  bone]
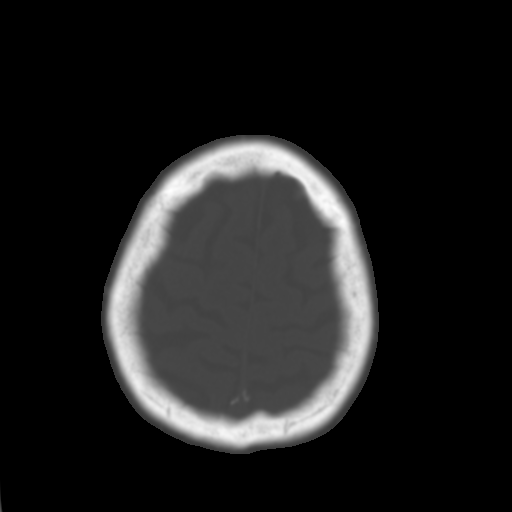
[im 30/33  brain]
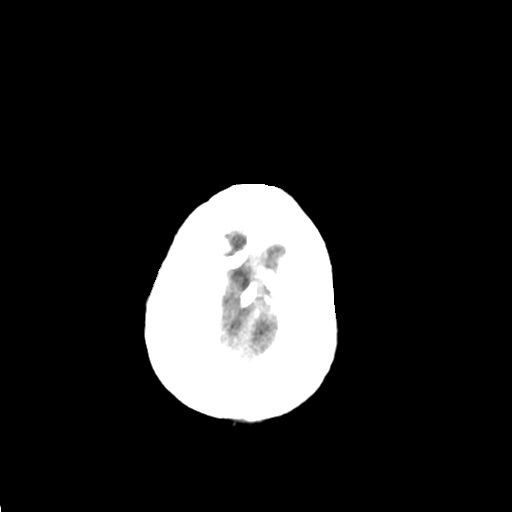

[Series 4: coronal soft tissue · coronal · 0.34mm/px · 3 of 70 slices shown]
[im 24/70  brain]
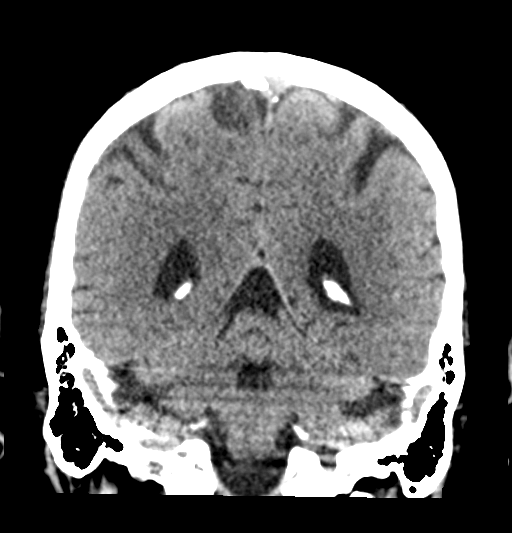
[im 31/70  brain]
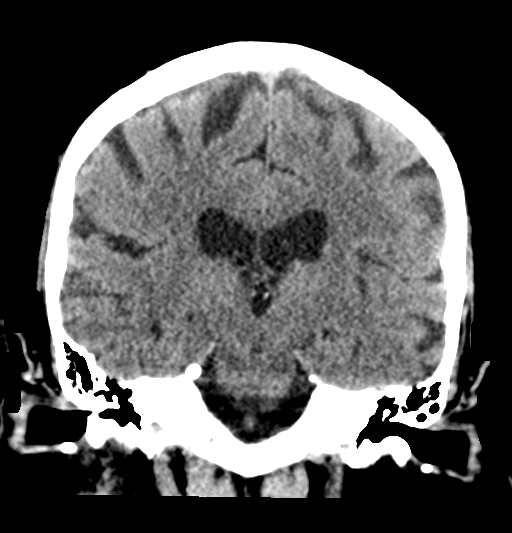
[im 39/70  brain]
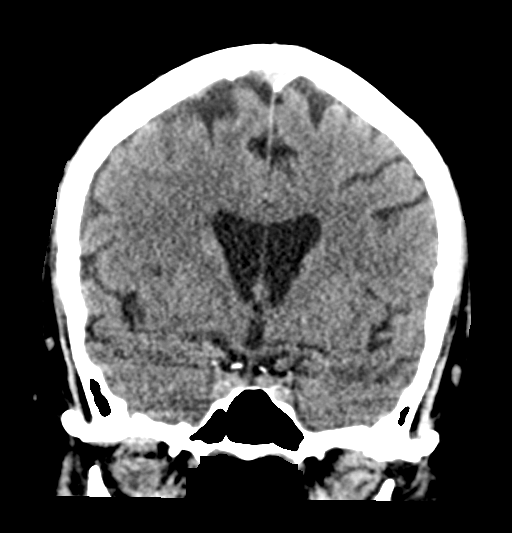

[Series 5: sagittal soft tissue · sagittal · 0.35mm/px · 3 of 59 slices shown]
[im 20/59  brain]
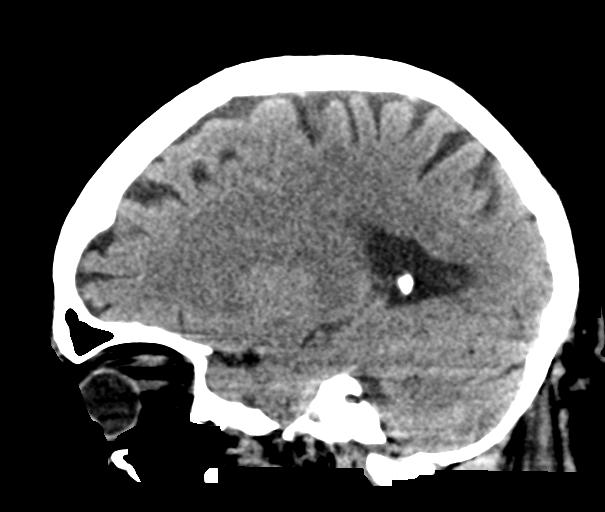
[im 30/59  brain]
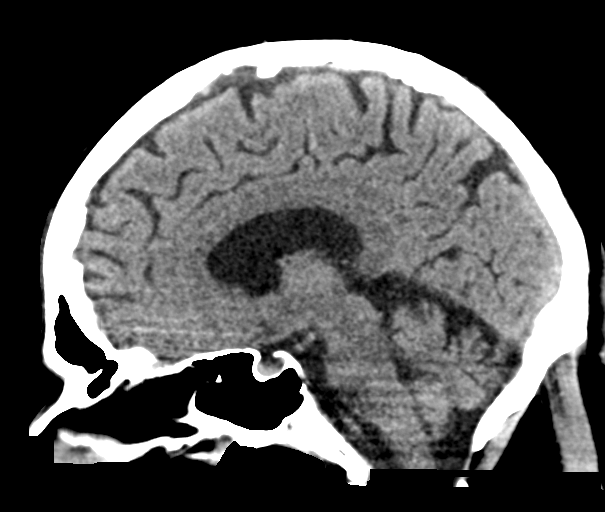
[im 39/59  brain]
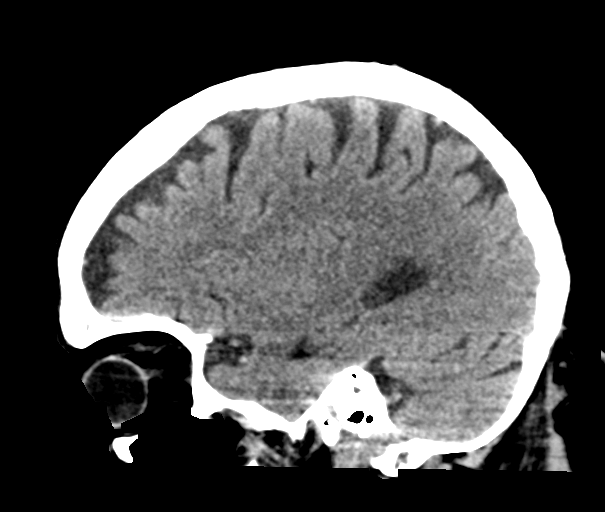

[16 of 47 positions shown; findings below may reference images not displayed]

FINDINGS: Brain: No acute intracranial hemorrhage. No midline shift or mass
effect. Gray-white differentiation maintained. Unremarkable
appearance of the ventricular system.

Vascular: Unremarkable.

Skull: No acute fracture.  No aggressive bone lesion identified.

Sinuses/Orbits: Unremarkable appearance of the orbits. Mastoid air
cells clear. No middle ear effusion. No significant sinus disease.

Other: None
IMPRESSION: No CT evidence of acute intracranial abnormality.

## 2017-11-26 IMAGING — DX DG CHEST 2V
2 series · 2 of 2 positions shown · non-contrast
Comparison: 10/04/2014

CLINICAL DATA: Hypotension and weakness. Bradycardia. Onset today.

EXAM:
CHEST  2 VIEW

[chest pa]
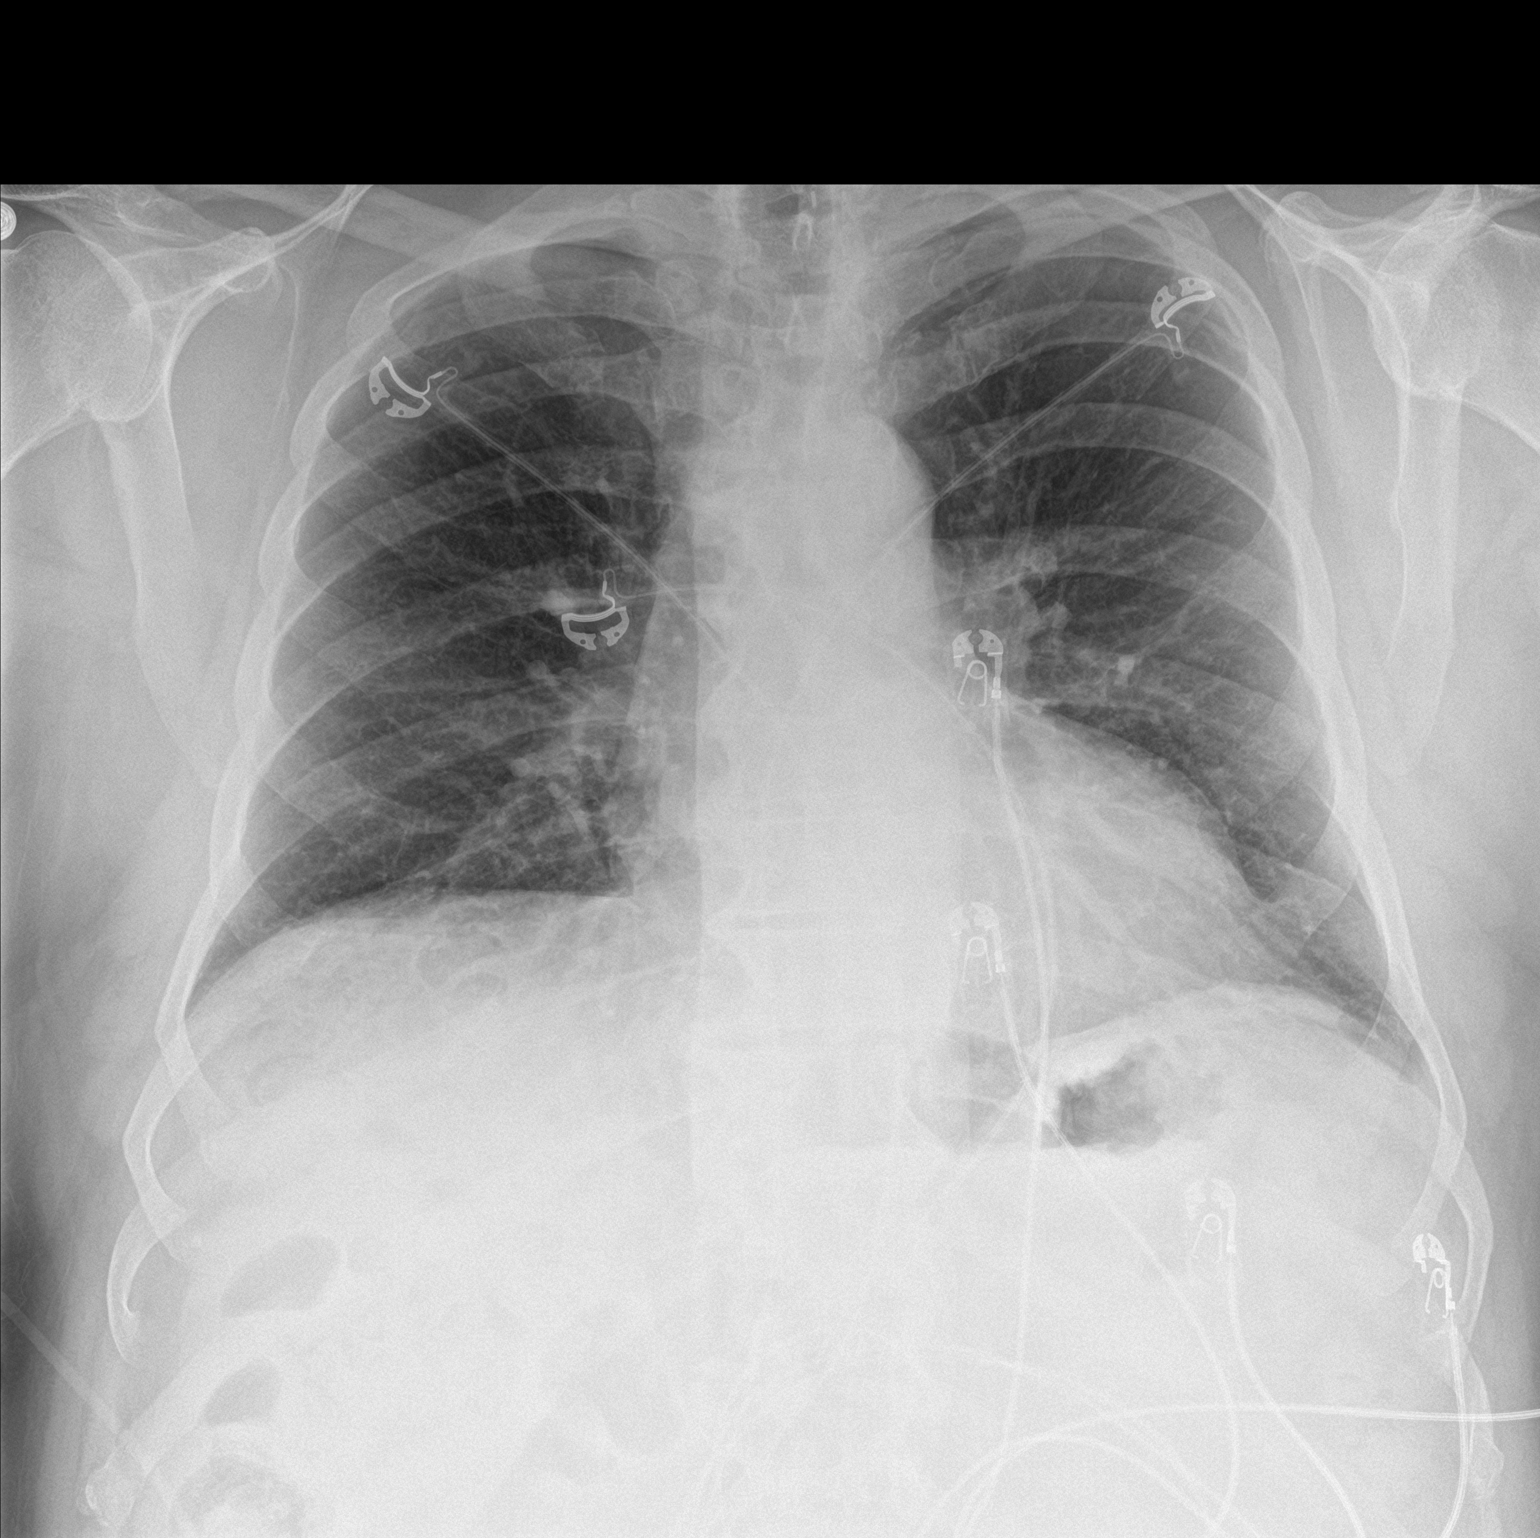

[chest lat]
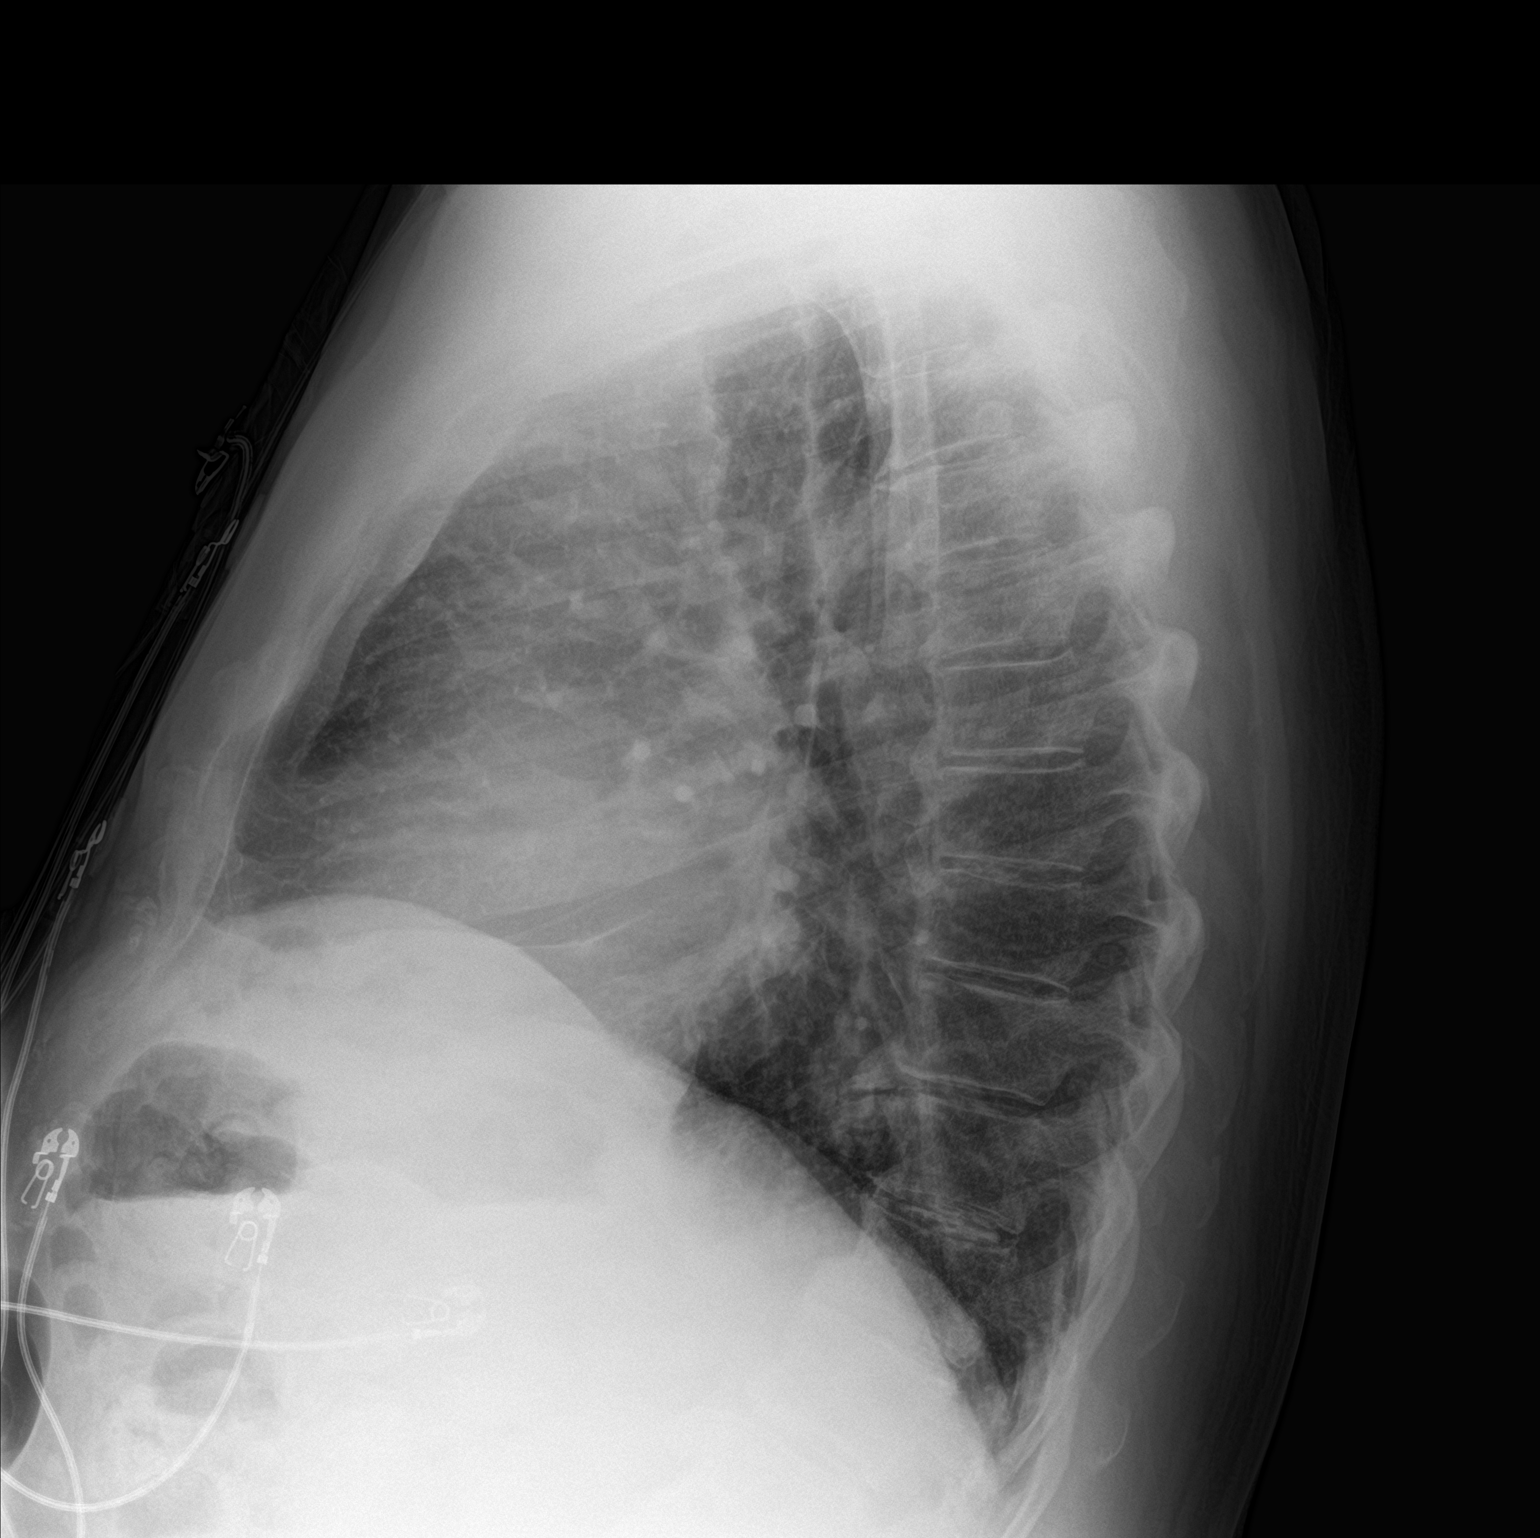

[2 of 2 positions shown; findings below may reference images not displayed]

FINDINGS: There is unchanged mild right hemidiaphragm elevation. The lungs are
clear. There is no pleural effusion. The pulmonary vasculature is
normal. Hilar, mediastinal and cardiac contours are unremarkable and
unchanged.
IMPRESSION: No acute cardiopulmonary findings.

## 2017-11-28 DIAGNOSIS — M48061 Spinal stenosis, lumbar region without neurogenic claudication: Secondary | ICD-10-CM | POA: Diagnosis not present

## 2017-12-17 DIAGNOSIS — M5416 Radiculopathy, lumbar region: Secondary | ICD-10-CM | POA: Diagnosis not present

## 2017-12-27 DIAGNOSIS — R001 Bradycardia, unspecified: Secondary | ICD-10-CM | POA: Diagnosis not present

## 2017-12-27 DIAGNOSIS — M1711 Unilateral primary osteoarthritis, right knee: Secondary | ICD-10-CM | POA: Diagnosis not present

## 2017-12-27 DIAGNOSIS — Z125 Encounter for screening for malignant neoplasm of prostate: Secondary | ICD-10-CM | POA: Diagnosis not present

## 2017-12-27 DIAGNOSIS — E782 Mixed hyperlipidemia: Secondary | ICD-10-CM | POA: Diagnosis not present

## 2017-12-27 DIAGNOSIS — E785 Hyperlipidemia, unspecified: Secondary | ICD-10-CM | POA: Diagnosis not present

## 2017-12-27 DIAGNOSIS — M1991 Primary osteoarthritis, unspecified site: Secondary | ICD-10-CM | POA: Diagnosis not present

## 2017-12-27 DIAGNOSIS — Z1389 Encounter for screening for other disorder: Secondary | ICD-10-CM | POA: Diagnosis not present

## 2017-12-27 DIAGNOSIS — I10 Essential (primary) hypertension: Secondary | ICD-10-CM | POA: Diagnosis not present

## 2017-12-27 DIAGNOSIS — Z0001 Encounter for general adult medical examination with abnormal findings: Secondary | ICD-10-CM | POA: Diagnosis not present

## 2017-12-27 DIAGNOSIS — Z6835 Body mass index (BMI) 35.0-35.9, adult: Secondary | ICD-10-CM | POA: Diagnosis not present

## 2017-12-27 DIAGNOSIS — R7309 Other abnormal glucose: Secondary | ICD-10-CM | POA: Diagnosis not present

## 2018-01-02 DIAGNOSIS — M5417 Radiculopathy, lumbosacral region: Secondary | ICD-10-CM | POA: Diagnosis not present

## 2018-01-24 DIAGNOSIS — R944 Abnormal results of kidney function studies: Secondary | ICD-10-CM | POA: Diagnosis not present

## 2018-03-19 ENCOUNTER — Other Ambulatory Visit: Payer: Self-pay | Admitting: Dermatology

## 2018-03-19 DIAGNOSIS — L570 Actinic keratosis: Secondary | ICD-10-CM | POA: Diagnosis not present

## 2018-03-19 DIAGNOSIS — D0439 Carcinoma in situ of skin of other parts of face: Secondary | ICD-10-CM | POA: Diagnosis not present

## 2018-03-19 DIAGNOSIS — L821 Other seborrheic keratosis: Secondary | ICD-10-CM | POA: Diagnosis not present

## 2018-03-19 DIAGNOSIS — C44329 Squamous cell carcinoma of skin of other parts of face: Secondary | ICD-10-CM | POA: Diagnosis not present

## 2018-03-19 DIAGNOSIS — D485 Neoplasm of uncertain behavior of skin: Secondary | ICD-10-CM | POA: Diagnosis not present

## 2018-03-24 DIAGNOSIS — H02833 Dermatochalasis of right eye, unspecified eyelid: Secondary | ICD-10-CM | POA: Diagnosis not present

## 2018-03-24 DIAGNOSIS — H2513 Age-related nuclear cataract, bilateral: Secondary | ICD-10-CM | POA: Diagnosis not present

## 2018-03-24 DIAGNOSIS — H35033 Hypertensive retinopathy, bilateral: Secondary | ICD-10-CM | POA: Diagnosis not present

## 2018-03-24 DIAGNOSIS — H25013 Cortical age-related cataract, bilateral: Secondary | ICD-10-CM | POA: Diagnosis not present

## 2018-03-25 DIAGNOSIS — Z6835 Body mass index (BMI) 35.0-35.9, adult: Secondary | ICD-10-CM | POA: Diagnosis not present

## 2018-03-25 DIAGNOSIS — Z1389 Encounter for screening for other disorder: Secondary | ICD-10-CM | POA: Diagnosis not present

## 2018-03-25 DIAGNOSIS — E6609 Other obesity due to excess calories: Secondary | ICD-10-CM | POA: Diagnosis not present

## 2018-03-25 DIAGNOSIS — J22 Unspecified acute lower respiratory infection: Secondary | ICD-10-CM | POA: Diagnosis not present

## 2018-03-25 DIAGNOSIS — J029 Acute pharyngitis, unspecified: Secondary | ICD-10-CM | POA: Diagnosis not present

## 2018-05-14 DIAGNOSIS — D0439 Carcinoma in situ of skin of other parts of face: Secondary | ICD-10-CM | POA: Diagnosis not present

## 2018-05-14 DIAGNOSIS — C44329 Squamous cell carcinoma of skin of other parts of face: Secondary | ICD-10-CM | POA: Diagnosis not present

## 2018-05-27 DIAGNOSIS — M5416 Radiculopathy, lumbar region: Secondary | ICD-10-CM | POA: Insufficient documentation

## 2018-06-06 DIAGNOSIS — M5116 Intervertebral disc disorders with radiculopathy, lumbar region: Secondary | ICD-10-CM | POA: Diagnosis not present

## 2018-06-06 DIAGNOSIS — M5137 Other intervertebral disc degeneration, lumbosacral region: Secondary | ICD-10-CM | POA: Diagnosis not present

## 2018-06-06 DIAGNOSIS — M5417 Radiculopathy, lumbosacral region: Secondary | ICD-10-CM | POA: Diagnosis not present

## 2018-06-30 DIAGNOSIS — M545 Low back pain, unspecified: Secondary | ICD-10-CM | POA: Insufficient documentation

## 2018-06-30 DIAGNOSIS — M48061 Spinal stenosis, lumbar region without neurogenic claudication: Secondary | ICD-10-CM | POA: Diagnosis not present

## 2018-06-30 DIAGNOSIS — M4310 Spondylolisthesis, site unspecified: Secondary | ICD-10-CM | POA: Insufficient documentation

## 2018-07-04 DIAGNOSIS — M5416 Radiculopathy, lumbar region: Secondary | ICD-10-CM | POA: Diagnosis not present

## 2018-07-04 DIAGNOSIS — M5116 Intervertebral disc disorders with radiculopathy, lumbar region: Secondary | ICD-10-CM | POA: Diagnosis not present

## 2018-07-04 DIAGNOSIS — M5136 Other intervertebral disc degeneration, lumbar region: Secondary | ICD-10-CM | POA: Diagnosis not present

## 2018-07-17 DIAGNOSIS — M545 Low back pain: Secondary | ICD-10-CM | POA: Diagnosis not present

## 2018-07-17 DIAGNOSIS — M4310 Spondylolisthesis, site unspecified: Secondary | ICD-10-CM | POA: Diagnosis not present

## 2018-07-17 DIAGNOSIS — M5136 Other intervertebral disc degeneration, lumbar region: Secondary | ICD-10-CM | POA: Diagnosis not present

## 2018-10-13 DIAGNOSIS — Z23 Encounter for immunization: Secondary | ICD-10-CM | POA: Diagnosis not present

## 2018-10-17 DIAGNOSIS — M5416 Radiculopathy, lumbar region: Secondary | ICD-10-CM | POA: Diagnosis not present

## 2018-12-16 ENCOUNTER — Other Ambulatory Visit: Payer: Self-pay | Admitting: Dermatology

## 2018-12-16 DIAGNOSIS — L820 Inflamed seborrheic keratosis: Secondary | ICD-10-CM | POA: Diagnosis not present

## 2018-12-16 DIAGNOSIS — D485 Neoplasm of uncertain behavior of skin: Secondary | ICD-10-CM | POA: Diagnosis not present

## 2018-12-16 DIAGNOSIS — L570 Actinic keratosis: Secondary | ICD-10-CM | POA: Diagnosis not present

## 2018-12-17 DIAGNOSIS — M4316 Spondylolisthesis, lumbar region: Secondary | ICD-10-CM | POA: Diagnosis not present

## 2018-12-17 DIAGNOSIS — I10 Essential (primary) hypertension: Secondary | ICD-10-CM | POA: Diagnosis not present

## 2018-12-17 DIAGNOSIS — Z6833 Body mass index (BMI) 33.0-33.9, adult: Secondary | ICD-10-CM | POA: Diagnosis not present

## 2018-12-22 ENCOUNTER — Other Ambulatory Visit (HOSPITAL_COMMUNITY): Payer: Self-pay | Admitting: Neurological Surgery

## 2018-12-22 ENCOUNTER — Other Ambulatory Visit: Payer: Self-pay | Admitting: Neurological Surgery

## 2018-12-22 DIAGNOSIS — M4316 Spondylolisthesis, lumbar region: Secondary | ICD-10-CM

## 2019-01-14 ENCOUNTER — Ambulatory Visit (HOSPITAL_COMMUNITY)
Admission: RE | Admit: 2019-01-14 | Discharge: 2019-01-14 | Disposition: A | Discharge status: Home / Self Care | Payer: Medicare Other | Source: Ambulatory Visit | Attending: Neurological Surgery | Admitting: Neurological Surgery

## 2019-01-14 DIAGNOSIS — M4807 Spinal stenosis, lumbosacral region: Secondary | ICD-10-CM | POA: Diagnosis not present

## 2019-01-14 DIAGNOSIS — M4316 Spondylolisthesis, lumbar region: Secondary | ICD-10-CM | POA: Diagnosis not present

## 2019-01-14 DIAGNOSIS — M5116 Intervertebral disc disorders with radiculopathy, lumbar region: Secondary | ICD-10-CM | POA: Insufficient documentation

## 2019-01-14 DIAGNOSIS — M48061 Spinal stenosis, lumbar region without neurogenic claudication: Secondary | ICD-10-CM | POA: Insufficient documentation

## 2019-01-14 MED ORDER — ONDANSETRON HCL 4 MG/2ML IJ SOLN: 4.0000 mg | Freq: Four times a day (QID) | INTRAMUSCULAR | Status: DC | PRN

## 2019-01-14 MED ORDER — DIAZEPAM 5 MG PO TABS: 5.0000 mg | ORAL_TABLET | Freq: Once | ORAL | Status: DC

## 2019-01-14 MED ORDER — IOPAMIDOL (ISOVUE-M 200) INJECTION 41%
10.0000 mL | Freq: Once | INTRAMUSCULAR | Status: AC
  Administered 2019-01-14: 12 mL via INTRATHECAL

## 2019-01-14 MED ORDER — HYDROCODONE-ACETAMINOPHEN 5-325 MG PO TABS: 1.0000 | ORAL_TABLET | ORAL | Status: DC | PRN

## 2019-01-14 MED ORDER — DIAZEPAM 5 MG PO TABS
ORAL_TABLET | ORAL | Status: AC
  Administered 2019-01-14: 5 mg
  Filled 2019-01-14: qty 1

## 2019-01-14 MED ORDER — LIDOCAINE HCL (PF) 1 % IJ SOLN
5.0000 mL | Freq: Once | INTRAMUSCULAR | Status: AC
  Administered 2019-01-14: 5 mL via INTRADERMAL

## 2019-01-14 NOTE — Discharge Instructions (Signed)
Myelogram, Care After ° °These instructions give you information about caring for yourself after your procedure. Your doctor may also give you more specific instructions. Call your doctor if you have any problems or questions after your procedure. °Follow these instructions at home: °· Drink enough fluid to keep your pee (urine) clear or pale yellow. °· Rest as told by your doctor. °· Lie flat with your head slightly raised (elevated). °· Do not bend, lift, or do any hard activities for 24-48 hours or as told by your doctor. °· Take over-the-counter and prescription medicines only as told by your doctor. °· Take care of and remove your bandage (dressing) as told by your doctor. °· Bathe or shower as told by your doctor. °Contact a health care provider if: °· You have a fever. °· You have a headache that lasts longer than 24 hours. °· You feel sick to your stomach (nauseous). °· You throw up (vomit). °· Your neck is stiff. °· Your legs feel numb. °· You cannot pee. °· You cannot poop (have a bowel movement). °· You have a rash. °· You are itchy or sneezing. °Get help right away if: °· You have new symptoms or your symptoms get worse. °· You have a seizure. °· You have trouble breathing. °This information is not intended to replace advice given to you by your health care provider. Make sure you discuss any questions you have with your health care provider. °Document Released: 09/25/2008 Document Revised: 08/16/2016 Document Reviewed: 09/29/2015 °Elsevier Interactive Patient Education © 2019 [employer] ° °

## 2019-01-14 NOTE — Procedures (Signed)
Mr. Nicholas Lewis is a 78 year old individual who has had some right lumbar radiculopathy for some period of time the symptoms would come and go and are largely tolerable but lately they have become more severe and unrelenting does feel that there is some weakness in the right lower extremity.  A myelogram is now being performed to better evaluate this process particularly with motion films to see how this process changes with mobility.  Pre op Dx: Lumbar radiculopathy with spondylolisthesis L4-L5 Post op Dx: Same Procedure: Lumbar myelogram Surgeon: Gaylord Seydel Puncture level: L3-4 Fluid color: Clear colorless Injection: Isovue-200, 12 mL Findings: Spondylolisthesis L4-5 with right-sided nerve root cut off further evaluation with CT scanning

## 2019-01-15 DIAGNOSIS — M48061 Spinal stenosis, lumbar region without neurogenic claudication: Secondary | ICD-10-CM | POA: Diagnosis not present

## 2019-01-15 DIAGNOSIS — M4316 Spondylolisthesis, lumbar region: Secondary | ICD-10-CM | POA: Diagnosis not present

## 2019-01-23 DIAGNOSIS — E6609 Other obesity due to excess calories: Secondary | ICD-10-CM | POA: Diagnosis not present

## 2019-01-23 DIAGNOSIS — Z0001 Encounter for general adult medical examination with abnormal findings: Secondary | ICD-10-CM | POA: Diagnosis not present

## 2019-01-23 DIAGNOSIS — M1991 Primary osteoarthritis, unspecified site: Secondary | ICD-10-CM | POA: Diagnosis not present

## 2019-01-23 DIAGNOSIS — Z6834 Body mass index (BMI) 34.0-34.9, adult: Secondary | ICD-10-CM | POA: Diagnosis not present

## 2019-01-23 DIAGNOSIS — Z1389 Encounter for screening for other disorder: Secondary | ICD-10-CM | POA: Diagnosis not present

## 2019-01-23 DIAGNOSIS — E7841 Elevated Lipoprotein(a): Secondary | ICD-10-CM | POA: Diagnosis not present

## 2019-01-23 DIAGNOSIS — I10 Essential (primary) hypertension: Secondary | ICD-10-CM | POA: Diagnosis not present

## 2019-02-24 DIAGNOSIS — Z0120 Encounter for dental examination and cleaning without abnormal findings: Secondary | ICD-10-CM | POA: Diagnosis not present

## 2019-03-24 DIAGNOSIS — R05 Cough: Secondary | ICD-10-CM | POA: Diagnosis not present

## 2019-03-26 DIAGNOSIS — Z1389 Encounter for screening for other disorder: Secondary | ICD-10-CM | POA: Diagnosis not present

## 2019-03-26 DIAGNOSIS — Z6835 Body mass index (BMI) 35.0-35.9, adult: Secondary | ICD-10-CM | POA: Diagnosis not present

## 2019-03-26 DIAGNOSIS — E6609 Other obesity due to excess calories: Secondary | ICD-10-CM | POA: Diagnosis not present

## 2019-03-26 DIAGNOSIS — N200 Calculus of kidney: Secondary | ICD-10-CM | POA: Diagnosis not present

## 2019-05-19 ENCOUNTER — Other Ambulatory Visit: Payer: Self-pay | Admitting: Neurological Surgery

## 2019-06-18 NOTE — Pre-Procedure Instructions (Signed)
[place]  06/18/2019      [place], [place] [address] [place] [address] Phone: [phone] Fax: [phone]    Your procedure is scheduled on June 23, 2019.  Report to [facility] Admitting at 530 AM.  Call this number if you have problems the morning of surgery:  [phone]   Remember:  Do not eat or drink after midnight.    Take these medicines the morning of surgery with A SIP OF WATER  Metoprolol tartrate (lopressor) Allopurinol (zyloprim) Gabapentin (neurontin) Levothyroxine (synthroid)  Follow your surgeon's instructions on when to hold/resume aspirin.  If no instructions were given call the office to determine how they would like to you take aspirin  7 days prior to surgery STOP taking any Aleve, Naproxen, Ibuprofen, Motrin, Advil, Goody's, BC's, all herbal medications, fish oil, and all vitamins    Do not wear jewelry  Do not wear lotions, powders, or colognes, or deodorant.  Men may shave face and neck.  Do not bring valuables to the hospital.  [facility] is not responsible for any belongings or valuables.  Contacts, dentures or bridgework may not be worn into surgery.  Leave your suitcase in the car.  After surgery it may be brought to your room.  For patients admitted to the hospital, discharge time will be determined by your treatment team.  Patients discharged the day of surgery will not be allowed to drive home.    [place]- Preparing For Surgery  Before surgery, you can play an important role. Because skin is not sterile, your skin needs to be as free of germs as possible. You can reduce the number of germs on your skin by washing with CHG (chlorahexidine gluconate) Soap before surgery.  CHG is an antiseptic cleaner which kills germs and bonds with the skin to continue killing germs even after washing.    Oral Hygiene is also important to reduce your risk of  infection.  Remember - BRUSH YOUR TEETH THE MORNING OF SURGERY WITH YOUR REGULAR TOOTHPASTE  Please do not use if you have an allergy to CHG or antibacterial soaps. If your skin becomes reddened/irritated stop using the CHG.  Do not shave (including legs and underarms) for at least 48 hours prior to first CHG shower. It is OK to shave your face.  Please follow these instructions carefully.   1. Shower the NIGHT BEFORE SURGERY and the MORNING OF SURGERY with CHG.   2. If you chose to wash your hair, wash your hair first as usual with your normal shampoo.  3. After you shampoo, rinse your hair and body thoroughly to remove the shampoo.  4. Use CHG as you would any other liquid soap. You can apply CHG directly to the skin and wash gently with a scrungie or a clean washcloth.   5. Apply the CHG Soap to your body ONLY FROM THE NECK DOWN.  Do not use on open wounds or open sores. Avoid contact with your eyes, ears, mouth and genitals (private parts). Wash Face and genitals (private parts)  with your normal soap.  6. Wash thoroughly, paying special attention to the area where your surgery will be performed.  7. Thoroughly rinse your body with warm water from the neck down.  8. DO NOT shower/wash with your normal soap after using and rinsing off the CHG Soap.  9. Pat yourself dry with a CLEAN TOWEL.  10. Wear CLEAN  PAJAMAS to bed the night before surgery, wear comfortable clothes the morning of surgery  11. Place CLEAN SHEETS on your bed the night of your first shower and DO NOT SLEEP WITH PETS.   Day of Surgery:  Do not apply any deodorants/lotions.  Please wear clean clothes to the hospital/surgery center.   Remember to brush your teeth WITH YOUR REGULAR TOOTHPASTE.   Please read over the following fact sheets that you were given.

## 2019-06-19 ENCOUNTER — Encounter (HOSPITAL_COMMUNITY)
Admission: RE | Admit: 2019-06-19 | Discharge: 2019-06-19 | Disposition: A | Discharge status: Home / Self Care | Payer: Medicare Other | Source: Ambulatory Visit | Attending: Neurological Surgery | Admitting: Neurological Surgery

## 2019-06-19 ENCOUNTER — Encounter (HOSPITAL_COMMUNITY): Payer: Self-pay

## 2019-06-19 ENCOUNTER — Other Ambulatory Visit (HOSPITAL_COMMUNITY)
Admission: RE | Admit: 2019-06-19 | Discharge: 2019-06-19 | Disposition: A | Discharge status: Home / Self Care | Payer: Medicare Other | Source: Ambulatory Visit | Attending: Neurological Surgery | Admitting: Neurological Surgery

## 2019-06-19 ENCOUNTER — Other Ambulatory Visit: Payer: Self-pay

## 2019-06-19 DIAGNOSIS — Z79899 Other long term (current) drug therapy: Secondary | ICD-10-CM | POA: Diagnosis not present

## 2019-06-19 DIAGNOSIS — R001 Bradycardia, unspecified: Secondary | ICD-10-CM | POA: Diagnosis not present

## 2019-06-19 DIAGNOSIS — E890 Postprocedural hypothyroidism: Secondary | ICD-10-CM | POA: Insufficient documentation

## 2019-06-19 DIAGNOSIS — M4316 Spondylolisthesis, lumbar region: Secondary | ICD-10-CM | POA: Diagnosis not present

## 2019-06-19 DIAGNOSIS — Z96653 Presence of artificial knee joint, bilateral: Secondary | ICD-10-CM | POA: Insufficient documentation

## 2019-06-19 DIAGNOSIS — Z7982 Long term (current) use of aspirin: Secondary | ICD-10-CM | POA: Diagnosis not present

## 2019-06-19 DIAGNOSIS — Z1159 Encounter for screening for other viral diseases: Secondary | ICD-10-CM | POA: Diagnosis not present

## 2019-06-19 DIAGNOSIS — I10 Essential (primary) hypertension: Secondary | ICD-10-CM | POA: Insufficient documentation

## 2019-06-19 DIAGNOSIS — Z79890 Hormone replacement therapy (postmenopausal): Secondary | ICD-10-CM | POA: Insufficient documentation

## 2019-06-19 DIAGNOSIS — Z01818 Encounter for other preprocedural examination: Secondary | ICD-10-CM | POA: Insufficient documentation

## 2019-06-19 DIAGNOSIS — Z85828 Personal history of other malignant neoplasm of skin: Secondary | ICD-10-CM | POA: Insufficient documentation

## 2019-06-19 LAB — TYPE AND SCREEN
ABO/RH(D): A NEG
Antibody Screen: NEGATIVE

## 2019-06-19 LAB — CBC
HCT: 41.9 % (ref 39.0–52.0)
Hemoglobin: 14.1 g/dL (ref 13.0–17.0)
MCH: 30.9 pg (ref 26.0–34.0)
MCHC: 33.7 g/dL (ref 30.0–36.0)
MCV: 91.7 fL (ref 80.0–100.0)
Platelets: 138 10*3/uL — ABNORMAL LOW (ref 150–400)
RBC: 4.57 MIL/uL (ref 4.22–5.81)
RDW: 12.6 % (ref 11.5–15.5)
WBC: 5.3 10*3/uL (ref 4.0–10.5)
nRBC: 0 % (ref 0.0–0.2)

## 2019-06-19 LAB — ABO/RH: ABO/RH(D): A NEG

## 2019-06-19 LAB — BASIC METABOLIC PANEL
Anion gap: 8 (ref 5–15)
BUN: 18 mg/dL (ref 8–23)
CO2: 24 mmol/L (ref 22–32)
Calcium: 8.8 mg/dL — ABNORMAL LOW (ref 8.9–10.3)
Chloride: 110 mmol/L (ref 98–111)
Creatinine, Ser: 1.04 mg/dL (ref 0.61–1.24)
GFR calc Af Amer: >60 mL/min (ref >60)
GFR calc non Af Amer: >60 mL/min (ref >60)
Glucose, Bld: 106 mg/dL — ABNORMAL HIGH (ref 70–99)
Potassium: 4.4 mmol/L (ref 3.5–5.1)
Sodium: 142 mmol/L (ref 135–145)

## 2019-06-19 LAB — SURGICAL PCR SCREEN
MRSA, PCR: NEGATIVE
Staphylococcus aureus: NEGATIVE

## 2019-06-19 LAB — SARS CORONAVIRUS 2 (TAT 6-24 HRS): SARS Coronavirus 2: NEGATIVE

## 2019-06-19 NOTE — Progress Notes (Addendum)
PCP - Sharilyn Sites, MD Cardiologist - pt denies  Chest x-ray - pt denies EKG - pt denies  Stress Test - pt denies ECHO - pt denies  Cardiac Cath - pt denies  Sleep Study - pt denies CPAP - n/a  Fasting Blood Sugar - n/a Checks Blood Sugar _____ times a day-n/a  Blood Thinner Instructions: n/a Aspirin Instructions: Aspirin 81 mg  Anesthesia review: Yes-low platlets  Patient denies shortness of breath, fever, cough and chest pain at PAT appointment  Patient verbalized understanding of instructions that were given to them at the PAT appointment. Patient was also instructed that they will need to review over the PAT instructions again at home before surgery.

## 2019-06-19 NOTE — Progress Notes (Signed)
Anesthesia Chart Review:  Case: [id number] Date/Time: 06/23/19 0715   Procedure: Lumbar 4-5 Lumbar 5 Sacral 1 Posterior lumbar interbody fusion (N/A Back) - Lumbar 4-5 Lumbar 5 Sacral 1 Posterior lumbar interbody fusion   Anesthesia type: General   Pre-op diagnosis: Spondylolisthesis, lumbar region   Location: [facility] OR ROOM 20 / [facility]   Surgeon: Kristeen Miss, MD      DISCUSSION: Patient is a 78 year old Lewis scheduled for the above procedure.  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 He reported that he is slow to wake up after anesthesia.  If no acute changes and I would anticipate that he could proceed as planned.  Aspirin is on hold for surgery.   VS: BP (!) 158/75   Pulse 60   Temp 36.8 C   Resp 20   Ht 5\' 11"  (1.803 m)   Wt 106.4 kg   SpO2 98%   BMI 32.72 kg/m   PROVIDERS: Sharilyn Sites, MD is PCP -He is not followed routinely by cardiology, but he saw Jenkins Rouge, MD on 02/04/17 for bradycardia and atypical chest pain. Bradycardia was felt to be exacerbated by a high dose of beta-blocker.  He had a low risk stress test and PRN follow-up recommended.    LABS: Labs reviewed: Acceptable for surgery. PLT count 138, which is consistent with previous results from 12/18/16.  (all labs ordered are listed, but only abnormal results are displayed)  Labs Reviewed  CBC - Abnormal; Notable for the following components:      Result Value   Platelets 138 (*)    All other components within normal limits  BASIC METABOLIC PANEL - Abnormal; Notable for the following components:   Glucose, Bld 106 (*)    Calcium 8.8 (*)    All other components within normal limits  SURGICAL PCR SCREEN  SARS CORONAVIRUS 2 (PERFORMED IN [facility])  TYPE AND SCREEN  ABO/RH    IMAGES: CT L-spine 01/14/19: IMPRESSION: 1. Grade 1/2 anterolisthesis at L4-5  with uncovering of a broad-based disc protrusion leading to moderate right subarticular and moderate bilateral foraminal stenosis, right greater than left. 2. Grade 1 anterolisthesis at L5-S1 with mild subarticular and moderate to severe foraminal stenosis. Foraminal narrowing is worse on the right. 3. Subarticular and foraminal narrowing at L2-3 is worse on the right, mild. 4. No significant change in listhesis with standing, flexion, or extension.   EKG: 06/19/19: Sinus bradycardia at 58 bpm Minimal voltage criteria for LVH, may be normal variant Borderline ECG   CV: Nuclear stress test 02/11/17:  Blood pressure demonstrated a normal response to exercise.  There was no ST segment deviation noted during stress.  The study is normal. There are no perfusion defects  This is a low risk study.  The left ventricular ejection fraction is normal (55-65%).  Duke treadmill score of 6 supports low risk for major cardiac events.   Past Medical History:  Diagnosis Date  . Abrasion    l arm  . Arthritis   . Cancer ([place])    skin  . Cellulitis 07/2012   after surgery for left knee  . Complication of anesthesia    slow to wake up  . Dog scratch 01/14/2015    office visit note from PCP on chart( to lower lip)   . Gout   . Hypertension   . Pneumonia    hx of  2015     Past Surgical History:  Procedure Laterality Date  . BACK SURGERY    . COLONOSCOPY  11/10/2010   RMR: internal hemorrhoids , anal papilla, otherwise normal rectum.  Long tortuous colon , polyps  in the cecum and sigmoid segment status post removeal as described above.   . COLONOSCOPY N/A 01/05/2016   Dr.Rourk- normal colonoscopy (redundant colon)   . CYSTECTOMY  2003   from spine  . JOINT REPLACEMENT    . KNEE ARTHROSCOPY  2000   left  . THYROID LOBECTOMY Left 01/20/2015   Procedure: LEFT THYROID LOBECTOMY;  Surgeon: Armandina Gemma, MD;  Location: [facility] ORS;  Service: General;  Laterality: Left;  . TOTAL KNEE  ARTHROPLASTY  08/06/2012   Procedure: TOTAL KNEE ARTHROPLASTY;  Surgeon: Magnus Sinning, MD;  Location: [facility]S;  Service: Orthopedics;  Laterality: Left;  . TOTAL KNEE ARTHROPLASTY Right 09/20/2014   Procedure: RIGHT TOTAL KNEE ARTHROPLASTY;  Surgeon: Gearlean Alf, MD;  Location: [facility];  Service: Orthopedics;  Laterality: Right;    MEDICATIONS: . allopurinol (ZYLOPRIM) 300 MG tablet  . aspirin EC 81 MG tablet  . gabapentin (NEURONTIN) 300 MG capsule  . levothyroxine (SYNTHROID, LEVOTHROID) 112 MCG tablet  . losartan (COZAAR) 50 MG tablet  . metoprolol tartrate (LOPRESSOR) 25 MG tablet  . Multiple Vitamin (MULTIVITAMIN WITH MINERALS) TABS tablet  . vitamin C (ASCORBIC ACID) 500 MG tablet   No current facility-administered medications for this encounter.     Myra Gianotti, PA-C Surgical Short Stay/Anesthesiology [facility] Phone [phone] [facility] Phone [phone] 06/19/2019 4:30 PM

## 2019-06-23 ENCOUNTER — Inpatient Hospital Stay (HOSPITAL_COMMUNITY): Payer: Medicare Other

## 2019-06-23 ENCOUNTER — Encounter (HOSPITAL_COMMUNITY): Payer: Self-pay | Admitting: *Deleted

## 2019-06-23 ENCOUNTER — Inpatient Hospital Stay (HOSPITAL_COMMUNITY)
Admission: RE | Admit: 2019-06-23 | Discharge: 2019-06-25 | DRG: 460 | Disposition: A | Discharge status: Home / Self Care | Payer: Medicare Other | Attending: Neurological Surgery | Admitting: Neurological Surgery

## 2019-06-23 ENCOUNTER — Inpatient Hospital Stay (HOSPITAL_COMMUNITY): Payer: Medicare Other | Admitting: Vascular Surgery

## 2019-06-23 ENCOUNTER — Other Ambulatory Visit: Payer: Self-pay

## 2019-06-23 ENCOUNTER — Encounter (HOSPITAL_COMMUNITY)
Admission: RE | Disposition: A | Discharge status: Home / Self Care | Payer: Self-pay | Source: Home / Self Care | Attending: Neurological Surgery

## 2019-06-23 ENCOUNTER — Inpatient Hospital Stay (HOSPITAL_COMMUNITY): Payer: Medicare Other | Admitting: Anesthesiology

## 2019-06-23 DIAGNOSIS — Z419 Encounter for procedure for purposes other than remedying health state, unspecified: Secondary | ICD-10-CM

## 2019-06-23 DIAGNOSIS — J189 Pneumonia, unspecified organism: Secondary | ICD-10-CM | POA: Diagnosis not present

## 2019-06-23 DIAGNOSIS — M5416 Radiculopathy, lumbar region: Secondary | ICD-10-CM | POA: Diagnosis not present

## 2019-06-23 DIAGNOSIS — Z981 Arthrodesis status: Secondary | ICD-10-CM | POA: Diagnosis not present

## 2019-06-23 DIAGNOSIS — M4316 Spondylolisthesis, lumbar region: Principal | ICD-10-CM | POA: Diagnosis present

## 2019-06-23 DIAGNOSIS — M4317 Spondylolisthesis, lumbosacral region: Secondary | ICD-10-CM | POA: Diagnosis not present

## 2019-06-23 DIAGNOSIS — M5417 Radiculopathy, lumbosacral region: Secondary | ICD-10-CM | POA: Diagnosis not present

## 2019-06-23 DIAGNOSIS — Z7982 Long term (current) use of aspirin: Secondary | ICD-10-CM

## 2019-06-23 DIAGNOSIS — Z882 Allergy status to sulfonamides status: Secondary | ICD-10-CM | POA: Diagnosis not present

## 2019-06-23 DIAGNOSIS — Z79899 Other long term (current) drug therapy: Secondary | ICD-10-CM

## 2019-06-23 DIAGNOSIS — M109 Gout, unspecified: Secondary | ICD-10-CM | POA: Diagnosis not present

## 2019-06-23 DIAGNOSIS — Z885 Allergy status to narcotic agent status: Secondary | ICD-10-CM | POA: Diagnosis not present

## 2019-06-23 DIAGNOSIS — Z1159 Encounter for screening for other viral diseases: Secondary | ICD-10-CM | POA: Diagnosis not present

## 2019-06-23 DIAGNOSIS — Z96653 Presence of artificial knee joint, bilateral: Secondary | ICD-10-CM | POA: Diagnosis present

## 2019-06-23 DIAGNOSIS — Z888 Allergy status to other drugs, medicaments and biological substances status: Secondary | ICD-10-CM

## 2019-06-23 DIAGNOSIS — I10 Essential (primary) hypertension: Secondary | ICD-10-CM | POA: Diagnosis present

## 2019-06-23 DIAGNOSIS — Z85828 Personal history of other malignant neoplasm of skin: Secondary | ICD-10-CM | POA: Diagnosis not present

## 2019-06-23 DIAGNOSIS — Z79890 Hormone replacement therapy (postmenopausal): Secondary | ICD-10-CM | POA: Diagnosis not present

## 2019-06-23 DIAGNOSIS — M48062 Spinal stenosis, lumbar region with neurogenic claudication: Secondary | ICD-10-CM | POA: Diagnosis not present

## 2019-06-23 DIAGNOSIS — M1990 Unspecified osteoarthritis, unspecified site: Secondary | ICD-10-CM | POA: Diagnosis not present

## 2019-06-23 DIAGNOSIS — E890 Postprocedural hypothyroidism: Secondary | ICD-10-CM | POA: Diagnosis not present

## 2019-06-23 SURGERY — POSTERIOR LUMBAR FUSION 2 LEVEL
Anesthesia: General | Site: Back

## 2019-06-23 MED ORDER — ONDANSETRON HCL 4 MG/2ML IJ SOLN: 4.0000 mg | Freq: Four times a day (QID) | INTRAMUSCULAR | Status: DC | PRN

## 2019-06-23 MED ORDER — MENTHOL 3 MG MT LOZG: 1.0000 | LOZENGE | OROMUCOSAL | Status: DC | PRN

## 2019-06-23 MED ORDER — GLYCOPYRROLATE PF 0.2 MG/ML IJ SOSY
PREFILLED_SYRINGE | INTRAMUSCULAR | Status: AC
  Filled 2019-06-23: qty 1

## 2019-06-23 MED ORDER — SUGAMMADEX SODIUM 500 MG/5ML IV SOLN
INTRAVENOUS | Status: DC | PRN
  Administered 2019-06-23: 210 mg via INTRAVENOUS

## 2019-06-23 MED ORDER — FENTANYL CITRATE (PF) 100 MCG/2ML IJ SOLN: 25.0000 ug | INTRAMUSCULAR | Status: DC | PRN

## 2019-06-23 MED ORDER — DEXAMETHASONE SODIUM PHOSPHATE 10 MG/ML IJ SOLN
INTRAMUSCULAR | Status: AC
  Filled 2019-06-23: qty 1

## 2019-06-23 MED ORDER — HYDROCODONE-ACETAMINOPHEN 5-325 MG PO TABS
1.0000 | ORAL_TABLET | ORAL | Status: DC | PRN
  Administered 2019-06-23 – 2019-06-25 (×8): 1 via ORAL
  Filled 2019-06-23 (×4): qty 1
  Filled 2019-06-23: qty 2
  Filled 2019-06-23 (×3): qty 1

## 2019-06-23 MED ORDER — ALUM & MAG HYDROXIDE-SIMETH 200-200-20 MG/5ML PO SUSP: 30.0000 mL | Freq: Four times a day (QID) | ORAL | Status: DC | PRN

## 2019-06-23 MED ORDER — EPHEDRINE SULFATE-NACL 50-0.9 MG/10ML-% IV SOSY
PREFILLED_SYRINGE | INTRAVENOUS | Status: DC | PRN
  Administered 2019-06-23 (×5): 10 mg via INTRAVENOUS

## 2019-06-23 MED ORDER — ROCURONIUM BROMIDE 10 MG/ML (PF) SYRINGE
PREFILLED_SYRINGE | INTRAVENOUS | Status: AC
  Filled 2019-06-23: qty 10

## 2019-06-23 MED ORDER — CEFAZOLIN SODIUM-DEXTROSE 2-4 GM/100ML-% IV SOLN
2.0000 g | Freq: Three times a day (TID) | INTRAVENOUS | Status: AC
  Administered 2019-06-23 (×2): 2 g via INTRAVENOUS
  Filled 2019-06-23 (×2): qty 100

## 2019-06-23 MED ORDER — 0.9 % SODIUM CHLORIDE (POUR BTL) OPTIME
TOPICAL | Status: DC | PRN
  Administered 2019-06-23: 1000 mL

## 2019-06-23 MED ORDER — PROPOFOL 10 MG/ML IV BOLUS
INTRAVENOUS | Status: AC
  Filled 2019-06-23: qty 20

## 2019-06-23 MED ORDER — ACETAMINOPHEN 650 MG RE SUPP: 650.0000 mg | RECTAL | Status: DC | PRN

## 2019-06-23 MED ORDER — LIDOCAINE 2% (20 MG/ML) 5 ML SYRINGE
INTRAMUSCULAR | Status: AC
  Filled 2019-06-23: qty 5

## 2019-06-23 MED ORDER — LACTATED RINGERS IV SOLN
INTRAVENOUS | Status: DC | PRN
  Administered 2019-06-23 (×2): via INTRAVENOUS

## 2019-06-23 MED ORDER — BUPIVACAINE HCL (PF) 0.5 % IJ SOLN
INTRAMUSCULAR | Status: AC
  Filled 2019-06-23: qty 30

## 2019-06-23 MED ORDER — ALBUMIN HUMAN 5 % IV SOLN
INTRAVENOUS | Status: DC | PRN
  Administered 2019-06-23 (×2): via INTRAVENOUS

## 2019-06-23 MED ORDER — DOCUSATE SODIUM 100 MG PO CAPS
100.0000 mg | ORAL_CAPSULE | Freq: Two times a day (BID) | ORAL | Status: DC
  Administered 2019-06-23 – 2019-06-25 (×4): 100 mg via ORAL
  Filled 2019-06-23 (×4): qty 1

## 2019-06-23 MED ORDER — CEFAZOLIN SODIUM 1 G IJ SOLR
INTRAMUSCULAR | Status: AC
  Filled 2019-06-23: qty 20

## 2019-06-23 MED ORDER — CEFAZOLIN SODIUM-DEXTROSE 2-4 GM/100ML-% IV SOLN
2.0000 g | INTRAVENOUS | Status: AC
  Administered 2019-06-23 (×2): 2 g via INTRAVENOUS
  Filled 2019-06-23: qty 100

## 2019-06-23 MED ORDER — KETOROLAC TROMETHAMINE 15 MG/ML IJ SOLN
7.5000 mg | Freq: Four times a day (QID) | INTRAMUSCULAR | Status: AC
  Administered 2019-06-23 – 2019-06-24 (×4): 7.5 mg via INTRAVENOUS
  Filled 2019-06-23 (×3): qty 1

## 2019-06-23 MED ORDER — METOPROLOL TARTRATE 25 MG PO TABS
25.0000 mg | ORAL_TABLET | Freq: Two times a day (BID) | ORAL | Status: DC
  Administered 2019-06-23 – 2019-06-25 (×3): 25 mg via ORAL
  Filled 2019-06-23 (×4): qty 1

## 2019-06-23 MED ORDER — LACTATED RINGERS IV SOLN: INTRAVENOUS | Status: DC

## 2019-06-23 MED ORDER — SENNA 8.6 MG PO TABS
1.0000 | ORAL_TABLET | Freq: Two times a day (BID) | ORAL | Status: DC
  Administered 2019-06-23 – 2019-06-25 (×5): 8.6 mg via ORAL
  Filled 2019-06-23 (×5): qty 1

## 2019-06-23 MED ORDER — SODIUM CHLORIDE 0.9 % IV SOLN
INTRAVENOUS | Status: DC | PRN
  Administered 2019-06-23: 09:00:00 500 mL

## 2019-06-23 MED ORDER — METHOCARBAMOL 500 MG PO TABS
500.0000 mg | ORAL_TABLET | Freq: Four times a day (QID) | ORAL | Status: DC | PRN
  Administered 2019-06-23 – 2019-06-25 (×5): 500 mg via ORAL
  Filled 2019-06-23 (×5): qty 1

## 2019-06-23 MED ORDER — ACETAMINOPHEN 325 MG PO TABS
650.0000 mg | ORAL_TABLET | ORAL | Status: DC | PRN
  Administered 2019-06-23 – 2019-06-24 (×3): 650 mg via ORAL
  Filled 2019-06-23 (×3): qty 2

## 2019-06-23 MED ORDER — ONDANSETRON HCL 4 MG/2ML IJ SOLN
INTRAMUSCULAR | Status: DC | PRN
  Administered 2019-06-23: 4 mg via INTRAVENOUS

## 2019-06-23 MED ORDER — EPHEDRINE 5 MG/ML INJ
INTRAVENOUS | Status: AC
  Filled 2019-06-23: qty 10

## 2019-06-23 MED ORDER — FENTANYL CITRATE (PF) 100 MCG/2ML IJ SOLN
INTRAMUSCULAR | Status: DC | PRN
  Administered 2019-06-23: 50 ug via INTRAVENOUS
  Administered 2019-06-23: 25 ug via INTRAVENOUS
  Administered 2019-06-23: 50 ug via INTRAVENOUS

## 2019-06-23 MED ORDER — THROMBIN 5000 UNITS EX SOLR
CUTANEOUS | Status: AC
  Filled 2019-06-23: qty 5000

## 2019-06-23 MED ORDER — POLYETHYLENE GLYCOL 3350 17 G PO PACK: 17.0000 g | PACK | Freq: Every day | ORAL | Status: DC | PRN

## 2019-06-23 MED ORDER — BISACODYL 10 MG RE SUPP: 10.0000 mg | Freq: Every day | RECTAL | Status: DC | PRN

## 2019-06-23 MED ORDER — THROMBIN 20000 UNITS EX SOLR
CUTANEOUS | Status: DC | PRN
  Administered 2019-06-23: 20 mL

## 2019-06-23 MED ORDER — DEXAMETHASONE SODIUM PHOSPHATE 10 MG/ML IJ SOLN
INTRAMUSCULAR | Status: DC | PRN
  Administered 2019-06-23: 10 mg via INTRAVENOUS

## 2019-06-23 MED ORDER — LIDOCAINE 2% (20 MG/ML) 5 ML SYRINGE
INTRAMUSCULAR | Status: DC | PRN
  Administered 2019-06-23: 50 mg via INTRAVENOUS

## 2019-06-23 MED ORDER — BUPIVACAINE HCL (PF) 0.5 % IJ SOLN
INTRAMUSCULAR | Status: DC | PRN
  Administered 2019-06-23: 5 mL

## 2019-06-23 MED ORDER — ONDANSETRON HCL 4 MG/2ML IJ SOLN
INTRAMUSCULAR | Status: AC
  Filled 2019-06-23: qty 2

## 2019-06-23 MED ORDER — METHOCARBAMOL 1000 MG/10ML IJ SOLN
500.0000 mg | Freq: Four times a day (QID) | INTRAVENOUS | Status: DC | PRN
  Filled 2019-06-23: qty 5

## 2019-06-23 MED ORDER — FLEET ENEMA 7-19 GM/118ML RE ENEM: 1.0000 | ENEMA | Freq: Once | RECTAL | Status: DC | PRN

## 2019-06-23 MED ORDER — PHENOL 1.4 % MT LIQD: 1.0000 | OROMUCOSAL | Status: DC | PRN

## 2019-06-23 MED ORDER — CHLORHEXIDINE GLUCONATE CLOTH 2 % EX PADS: 6.0000 | MEDICATED_PAD | Freq: Once | CUTANEOUS | Status: DC

## 2019-06-23 MED ORDER — THROMBIN 5000 UNITS EX SOLR
OROMUCOSAL | Status: DC | PRN
  Administered 2019-06-23 (×2): 5 mL

## 2019-06-23 MED ORDER — FENTANYL CITRATE (PF) 250 MCG/5ML IJ SOLN
INTRAMUSCULAR | Status: AC
  Filled 2019-06-23: qty 5

## 2019-06-23 MED ORDER — GLYCOPYRROLATE 0.2 MG/ML IJ SOLN
INTRAMUSCULAR | Status: DC | PRN
  Administered 2019-06-23: 0.2 mg via INTRAVENOUS

## 2019-06-23 MED ORDER — GABAPENTIN 300 MG PO CAPS
300.0000 mg | ORAL_CAPSULE | Freq: Two times a day (BID) | ORAL | Status: DC
  Administered 2019-06-23 – 2019-06-25 (×4): 300 mg via ORAL
  Filled 2019-06-23 (×4): qty 1

## 2019-06-23 MED ORDER — LIDOCAINE-EPINEPHRINE 1 %-1:100000 IJ SOLN
INTRAMUSCULAR | Status: AC
  Filled 2019-06-23: qty 1

## 2019-06-23 MED ORDER — KETOROLAC TROMETHAMINE 15 MG/ML IJ SOLN
INTRAMUSCULAR | Status: AC
  Filled 2019-06-23: qty 1

## 2019-06-23 MED ORDER — LOSARTAN POTASSIUM 50 MG PO TABS
100.0000 mg | ORAL_TABLET | Freq: Every day | ORAL | Status: DC
  Administered 2019-06-25: 100 mg via ORAL
  Filled 2019-06-23 (×2): qty 2

## 2019-06-23 MED ORDER — ROCURONIUM BROMIDE 50 MG/5ML IV SOSY
PREFILLED_SYRINGE | INTRAVENOUS | Status: DC | PRN
  Administered 2019-06-23: 30 mg via INTRAVENOUS
  Administered 2019-06-23 (×2): 50 mg via INTRAVENOUS
  Administered 2019-06-23: 70 mg via INTRAVENOUS

## 2019-06-23 MED ORDER — LIDOCAINE-EPINEPHRINE 1 %-1:100000 IJ SOLN
INTRAMUSCULAR | Status: DC | PRN
  Administered 2019-06-23: 5 mL

## 2019-06-23 MED ORDER — MORPHINE SULFATE (PF) 2 MG/ML IV SOLN: 2.0000 mg | INTRAVENOUS | Status: DC | PRN

## 2019-06-23 MED ORDER — ALLOPURINOL 300 MG PO TABS
150.0000 mg | ORAL_TABLET | Freq: Every morning | ORAL | Status: DC
  Administered 2019-06-24 – 2019-06-25 (×2): 150 mg via ORAL
  Filled 2019-06-23 (×2): qty 0.5

## 2019-06-23 MED ORDER — PROPOFOL 10 MG/ML IV BOLUS
INTRAVENOUS | Status: DC | PRN
  Administered 2019-06-23: 170 mg via INTRAVENOUS

## 2019-06-23 MED ORDER — THROMBIN 20000 UNITS EX SOLR
CUTANEOUS | Status: AC
  Filled 2019-06-23: qty 20000

## 2019-06-23 MED ORDER — LEVOTHYROXINE SODIUM 112 MCG PO TABS
112.0000 ug | ORAL_TABLET | Freq: Every day | ORAL | Status: DC
  Administered 2019-06-24 – 2019-06-25 (×2): 112 ug via ORAL
  Filled 2019-06-23 (×2): qty 1

## 2019-06-23 MED ORDER — ONDANSETRON HCL 4 MG PO TABS: 4.0000 mg | ORAL_TABLET | Freq: Four times a day (QID) | ORAL | Status: DC | PRN

## 2019-06-23 MED ORDER — SODIUM CHLORIDE 0.9% FLUSH: 3.0000 mL | INTRAVENOUS | Status: DC | PRN

## 2019-06-23 MED ORDER — SUGAMMADEX SODIUM 500 MG/5ML IV SOLN
INTRAVENOUS | Status: AC
  Filled 2019-06-23: qty 5

## 2019-06-23 MED ORDER — SODIUM CHLORIDE 0.9% FLUSH
3.0000 mL | Freq: Two times a day (BID) | INTRAVENOUS | Status: DC
  Administered 2019-06-23: 3 mL via INTRAVENOUS

## 2019-06-23 MED ORDER — SODIUM CHLORIDE 0.9 % IV SOLN
INTRAVENOUS | Status: DC | PRN
  Administered 2019-06-23: 25 ug/min via INTRAVENOUS

## 2019-06-23 SURGICAL SUPPLY — 73 items
ADH SKN CLS APL DERMABOND .7 (GAUZE/BANDAGES/DRESSINGS) ×1
APL SRG 60D 8 XTD TIP BNDBL (TIP)
BAG DECANTER FOR FLEXI CONT (MISCELLANEOUS) ×3 IMPLANT
BASKET BONE COLLECTION (BASKET) ×3 IMPLANT
BLADE CLIPPER SURG (BLADE) IMPLANT
BONE CANC CHIPS 40CC CAN1/2 (Bone Implant) ×3 IMPLANT
BUR MATCHSTICK NEURO 3.0 LAGG (BURR) ×3 IMPLANT
CAGE COROENT 11X9X23-8 (Cage) ×4 IMPLANT
CAGE COROENT LG 10X9X23-12 (Cage) ×4 IMPLANT
CANISTER SUCT 3000ML PPV (MISCELLANEOUS) ×3 IMPLANT
CHIPS CANC BONE 40CC CAN1/2 (Bone Implant) ×1 IMPLANT
CONT SPEC 4OZ CLIKSEAL STRL BL (MISCELLANEOUS) ×3 IMPLANT
COVER BACK TABLE 60X90IN (DRAPES) ×3 IMPLANT
COVER WAND RF STERILE (DRAPES) ×1 IMPLANT
DECANTER SPIKE VIAL GLASS SM (MISCELLANEOUS) ×3 IMPLANT
DERMABOND ADVANCED (GAUZE/BANDAGES/DRESSINGS) ×2
DERMABOND ADVANCED .7 DNX12 (GAUZE/BANDAGES/DRESSINGS) ×1 IMPLANT
DEVICE DISSECT PLASMABLAD 3.0S (MISCELLANEOUS) ×1 IMPLANT
DRAPE C-ARM 42X72 X-RAY (DRAPES) ×6 IMPLANT
DRAPE HALF SHEET 40X57 (DRAPES) ×2 IMPLANT
DRAPE LAPAROTOMY 100X72X124 (DRAPES) ×3 IMPLANT
DURAPREP 26ML APPLICATOR (WOUND CARE) ×3 IMPLANT
DURASEAL APPLICATOR TIP (TIP) IMPLANT
DURASEAL SPINE SEALANT 3ML (MISCELLANEOUS) IMPLANT
ELECT REM PT RETURN 9FT ADLT (ELECTROSURGICAL) ×3
ELECTRODE REM PT RTRN 9FT ADLT (ELECTROSURGICAL) ×1 IMPLANT
GAUZE 4X4 16PLY RFD (DISPOSABLE) IMPLANT
GAUZE SPONGE 4X4 12PLY STRL (GAUZE/BANDAGES/DRESSINGS) ×3 IMPLANT
GLOVE BIOGEL M 7.0 STRL (GLOVE) ×2 IMPLANT
GLOVE BIOGEL PI IND STRL 7.0 (GLOVE) IMPLANT
GLOVE BIOGEL PI IND STRL 8.5 (GLOVE) ×2 IMPLANT
GLOVE BIOGEL PI INDICATOR 7.0 (GLOVE) ×2
GLOVE BIOGEL PI INDICATOR 8.5 (GLOVE) ×4
GLOVE ECLIPSE 8.5 STRL (GLOVE) ×6 IMPLANT
GLOVE SS N UNI LF 6.5 STRL (GLOVE) ×8 IMPLANT
GOWN STRL REUS W/ TWL LRG LVL3 (GOWN DISPOSABLE) IMPLANT
GOWN STRL REUS W/ TWL XL LVL3 (GOWN DISPOSABLE) IMPLANT
GOWN STRL REUS W/TWL 2XL LVL3 (GOWN DISPOSABLE) ×6 IMPLANT
GOWN STRL REUS W/TWL LRG LVL3 (GOWN DISPOSABLE) ×6
GOWN STRL REUS W/TWL XL LVL3 (GOWN DISPOSABLE)
GRAFT BNE CHIP CANC 1-8 40 (Bone Implant) IMPLANT
HEMOSTAT POWDER KIT SURGIFOAM (HEMOSTASIS) IMPLANT
HEMOSTAT POWDER SURGIFOAM 1G (HEMOSTASIS) ×4 IMPLANT
KIT BASIN OR (CUSTOM PROCEDURE TRAY) ×3 IMPLANT
KIT INFUSE SMALL (Orthopedic Implant) ×2 IMPLANT
KIT TURNOVER KIT B (KITS) ×3 IMPLANT
MILL MEDIUM DISP (BLADE) ×3 IMPLANT
NDL SPNL 18GX3.5 QUINCKE PK (NEEDLE) IMPLANT
NEEDLE HYPO 22GX1.5 SAFETY (NEEDLE) ×3 IMPLANT
NEEDLE SPNL 18GX3.5 QUINCKE PK (NEEDLE) ×3 IMPLANT
NS IRRIG 1000ML POUR BTL (IV SOLUTION) ×3 IMPLANT
PACK LAMINECTOMY NEURO (CUSTOM PROCEDURE TRAY) ×3 IMPLANT
PAD ARMBOARD 7.5X6 YLW CONV (MISCELLANEOUS) ×9 IMPLANT
PATTIES SURGICAL .5 X1 (DISPOSABLE) ×3 IMPLANT
PLASMABLADE 3.0S (MISCELLANEOUS) ×3
ROD RELINE LOROTIC TI 5.5X55MM (Rod) ×4 IMPLANT
SCREW LOCK RELINE 5.5 TULIP (Screw) ×12 IMPLANT
SCREW RELINE-O POLY 6.5X45 (Screw) ×12 IMPLANT
SPONGE LAP 4X18 RFD (DISPOSABLE) ×2 IMPLANT
SPONGE NEURO XRAY DETECT 1X3 (DISPOSABLE) ×2 IMPLANT
SPONGE SURGIFOAM ABS GEL 100 (HEMOSTASIS) ×3 IMPLANT
SUT PROLENE 6 0 BV (SUTURE) IMPLANT
SUT VIC AB 1 CT1 18XBRD ANBCTR (SUTURE) ×1 IMPLANT
SUT VIC AB 1 CT1 8-18 (SUTURE) ×6
SUT VIC AB 2-0 CP2 18 (SUTURE) ×5 IMPLANT
SUT VIC AB 3-0 SH 8-18 (SUTURE) ×3 IMPLANT
SYR 3ML LL SCALE MARK (SYRINGE) ×12 IMPLANT
SYR 5ML LL (SYRINGE) IMPLANT
SYR CONTROL 10ML LL (SYRINGE) ×2 IMPLANT
TOWEL GREEN STERILE (TOWEL DISPOSABLE) ×3 IMPLANT
TOWEL GREEN STERILE FF (TOWEL DISPOSABLE) ×3 IMPLANT
TRAY FOLEY MTR SLVR 16FR STAT (SET/KITS/TRAYS/PACK) ×3 IMPLANT
WATER STERILE IRR 1000ML POUR (IV SOLUTION) ×3 IMPLANT

## 2019-06-23 NOTE — H&P (Signed)
Nicholas Lewis is an 78 y.o. male.   Chief Complaint: Back and right leg pain HPI: Mr. Adrian Prince is a 78 year old individual whose had significant back and right lower extremity pain for over a year's period of time.  He has manage this conservatively however lately it is been becoming worse and intolerable.  After period of walking distance he finds himself stooping forward in order to alleviate some of the pain.  In January a myelogram was performed and this demonstrated the presence of a spondylolisthesis at L4-5 and significant and severe spondylosis at L5-S1 with marked facet overgrowth and lateral recess stenosis bilaterally at that level he was advised regarding surgery and is now admitted to undergo surgical decompression and stabilization from L4 to the sacrum.  Past Medical History:  Diagnosis Date  . Abrasion    l arm  . Arthritis   . Cancer ([place])    skin  . Cellulitis 07/2012   after surgery for left knee  . Complication of anesthesia    slow to wake up  . Dog scratch 01/14/2015    office visit note from PCP on chart( to lower lip)   . Gout   . Hypertension   . Pneumonia    hx of 2015     Past Surgical History:  Procedure Laterality Date  . BACK SURGERY    . COLONOSCOPY  11/10/2010   RMR: internal hemorrhoids , anal papilla, otherwise normal rectum.  Long tortuous colon , polyps  in the cecum and sigmoid segment status post removeal as described above.   . COLONOSCOPY N/A 01/05/2016   Dr.Rourk- normal colonoscopy (redundant colon)   . CYSTECTOMY  2003   from spine  . JOINT REPLACEMENT    . KNEE ARTHROSCOPY  2000   left  . THYROID LOBECTOMY Left 01/20/2015   Procedure: LEFT THYROID LOBECTOMY;  Surgeon: Armandina Gemma, MD;  Location: [facility];  Service: General;  Laterality: Left;  . TOTAL KNEE ARTHROPLASTY  08/06/2012   Procedure: TOTAL KNEE ARTHROPLASTY;  Surgeon: Magnus Sinning, MD;  Location: [facility];  Service: Orthopedics;  Laterality: Left;  . TOTAL KNEE ARTHROPLASTY  Right 09/20/2014   Procedure: RIGHT TOTAL KNEE ARTHROPLASTY;  Surgeon: Gearlean Alf, MD;  Location: [facility];  Service: Orthopedics;  Laterality: Right;    Family History  Problem Relation Age of Onset  . Colon cancer Father 54   Social History:  reports that he has never smoked. He has never used smokeless tobacco. He reports that he does not drink alcohol or use drugs.  Allergies:  Allergies  Allergen Reactions  . Sulfa Antibiotics Other (See Comments)    Dizziness and vision problems  . Mobic [Meloxicam] Rash  . Percocet [Oxycodone-Acetaminophen] Other (See Comments)    Very sleepy. Sensitive to all narcotics.    Medications Prior to Admission  Medication Sig Dispense Refill  . allopurinol (ZYLOPRIM) 300 MG tablet Take 150 mg by mouth every morning.     . gabapentin (NEURONTIN) 300 MG capsule Take 300 mg by mouth 2 (two) times a day.    . levothyroxine (SYNTHROID, LEVOTHROID) 112 MCG tablet Take 112 mcg by mouth daily before breakfast.   2  . losartan (COZAAR) 50 MG tablet Take 100 mg by mouth daily.     . metoprolol tartrate (LOPRESSOR) 25 MG tablet Take 25 mg by mouth 2 (two) times daily.   2  . Multiple Vitamin (MULTIVITAMIN WITH MINERALS) TABS tablet Take 1 tablet by mouth daily.    Marland Kitchen  vitamin C (ASCORBIC ACID) 500 MG tablet Take 500 mg by mouth daily.    Marland Kitchen aspirin EC 81 MG tablet Take 81 mg by mouth daily.       No results found for this or any previous visit (from the past 48 hour(s)). No results found.  Review of Systems  Constitutional: Negative.   HENT: Negative.   Eyes: Negative.   Respiratory: Negative.   Gastrointestinal: Negative.   Genitourinary: Negative.   Musculoskeletal: Positive for back pain.  Skin: Negative.   Neurological: Negative.   Endo/Heme/Allergies: Negative.   Psychiatric/Behavioral: Negative.     Blood pressure 124/77, pulse 60, temperature 98.5 F (36.9 C), temperature source Oral, resp. rate 18, height 5\' 11"  (1.803 m), weight 102.1  kg. Physical Exam  Constitutional: He is oriented to person, place, and time. He appears well-developed and well-nourished.  HENT:  Head: Normocephalic and atraumatic.  Eyes: Pupils are equal, round, and reactive to light. Conjunctivae and EOM are normal.  Neck: Normal range of motion. Neck supple.  Cardiovascular: Normal rate and regular rhythm.  Respiratory: Effort normal and breath sounds normal.  GI: Soft. Bowel sounds are normal.  Musculoskeletal:     Comments: Positive straight leg raising at 15 degrees.  Patrick's maneuver is negative bilaterally.  Tenderness over the posterior superior iliac crest on the right.  Neurological: He is alert and oriented to person, place, and time.  Motor strength is intact in the iliopsoas and the quadriceps tibialis anterior is weak on the right 4 out of 5 gastroc strength is diminished on the right also to 4 out of 5 absent Achilles reflex on the right 1+ patellar reflex on the right 2+ reflexes on the left side upper extremity strength and reflexes are normal  Cranial nerve examination is normal.  Skin: Skin is warm and dry.  Psychiatric: He has a normal mood and affect. His behavior is normal. Judgment and thought content normal.     Assessment/Plan Spondylosis and spondylolisthesis L4-5 L5-S1 with lumbar radiculopathy and lumbar stenosis.  Plan: Lumbar decompression L4-5 and L5-S1 posterior lumbar interbody arthrodesis with stabilization L4 to the sacrum.  Earleen Newport, MD 06/23/2019, 7:31 AM

## 2019-06-23 NOTE — Anesthesia Preprocedure Evaluation (Signed)
Anesthesia Evaluation  Patient identified by MRN, date of birth, ID band Patient awake    Reviewed: Allergy & Precautions, H&P , NPO status , Patient's Chart, lab work & pertinent test results  Airway Mallampati: II   Neck ROM: full    Dental   Pulmonary neg pulmonary ROS,    breath sounds clear to auscultation       Cardiovascular hypertension,  Rhythm:regular Rate:Normal     Neuro/Psych    GI/Hepatic   Endo/Other  obese  Renal/GU      Musculoskeletal  (+) Arthritis ,   Abdominal   Peds  Hematology   Anesthesia Other Findings   Reproductive/Obstetrics                             Anesthesia Physical Anesthesia Plan  ASA: II  Anesthesia Plan: General   Post-op Pain Management:    Induction: Intravenous  PONV Risk Score and Plan: 2 and Ondansetron, Dexamethasone, Midazolam and Treatment may vary due to age or medical condition  Airway Management Planned: Oral ETT  Additional Equipment:   Intra-op Plan:   Post-operative Plan: Extubation in OR  Informed Consent: I have reviewed the patients History and Physical, chart, labs and discussed the procedure including the risks, benefits and alternatives for the proposed anesthesia with the patient or authorized representative who has indicated his/her understanding and acceptance.       Plan Discussed with: CRNA, Anesthesiologist and Surgeon  Anesthesia Plan Comments:         Anesthesia Quick Evaluation

## 2019-06-23 NOTE — Evaluation (Signed)
Physical Therapy Evaluation Patient Details Name: Nicholas Lewis MRN: [id number] DOB: [birth year] Today's Date: 06/23/2019   History of Present Illness  Patient is a 78 y/o male who presents s/p L4-5, L5-S1 decompression, laminectomy and fusion. PMH includes bil TKAs, HA.  Clinical Impression  Patient presents with pain and post surgical deficits s/p above surgery. 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 Pt tolerated bed mobility, transfers and gait training with min guard-min A for balance/safety. Noted to have what looks like leg length discrepancy during gait training with RLE appearing longer then LLE resulting in left lateral lean and limp during walking. "It feels heavy, like I am stepping up a step." Recommend using RW for support. Pt agreeable. Reports this is new. Rn notified and will inform MD. Education re: back precautions, handout, brace, positioning, walking program and log roll technique. Will follow acutely to maximize independence and mobility prior to return home.     Follow Up Recommendations Home health PT;Supervision - Intermittent    Equipment Recommendations  None recommended by PT    Recommendations for Other Services       Precautions / Restrictions Precautions Precautions: Back;Fall Precaution Booklet Issued: Yes (comment) Precaution Comments: Reviewed back precautions and handout Required Braces or Orthoses: Spinal Brace Spinal Brace: Applied in sitting position Restrictions Weight Bearing Restrictions: No      Mobility  Bed Mobility Overal bed mobility: Needs Assistance Bed Mobility: Rolling;Sidelying to Sit Rolling: Min guard Sidelying to sit: Min guard;HOB elevated       General bed mobility comments: Cues for log roll technique; HOB flat, no use of rails to simulate home. Increased time and effort.  Transfers Overall transfer level: Needs assistance Equipment used: None Transfers: Sit to/from Stand Sit to Stand: Min  assist         General transfer comment: Light Min A to rise from EOB. Slow to rise.  Ambulation/Gait Ambulation/Gait assistance: Min assist Gait Distance (Feet): 120 Feet Assistive device: IV Pole Gait Pattern/deviations: Step-through pattern;Antalgic;Decreased stride length Gait velocity: decreased   General Gait Details: Slow, antaglic like gait; almost looks like pt with longer leg length on RLE, "it feels heavy and like I am stepping up a step with my RLE" Leans to the left during gait.  Stairs            Wheelchair Mobility    Modified Rankin (Stroke Patients Only)       Balance Overall balance assessment: Needs assistance Sitting-balance support: Feet supported;No upper extremity supported Sitting balance-Leahy Scale: Good Sitting balance - Comments: Able to donn brace with setup and cues   Standing balance support: During functional activity Standing balance-Leahy Scale: Fair Standing balance comment: Able to stand statically without Ue support but needs UE support for dynamic standing.                             Pertinent Vitals/Pain Pain Assessment: Faces Faces Pain Scale: Hurts little more Pain Location: back at surgical site Pain Descriptors / Indicators: Sore;Operative site guarding Pain Intervention(s): Monitored during session;Repositioned    Home Living Family/patient expects to be discharged to:: Private residence Living Arrangements: Spouse/significant other Available Help at Discharge: Family;Available 24 hours/day Type of Home: House Home Access: Stairs to enter Entrance Stairs-Rails: None Entrance Stairs-Number of Steps: 1 + 1 Home Layout: One level Home Equipment: Walker - 2 wheels;Bedside commode      Prior Function Level of  Independence: Independent         Comments: Owns a cattle farm     Hand Dominance   Dominant Hand: Right    Extremity/Trunk Assessment   Upper Extremity Assessment Upper Extremity  Assessment: Defer to OT evaluation    Lower Extremity Assessment Lower Extremity Assessment: RLE deficits/detail RLE Deficits / Details: Noted to have what looks like leg length discrepancy functionally with RLE appearing longer then LLE; grossly ~3+/5 throughout RLE Sensation: WNL    Cervical / Trunk Assessment Cervical / Trunk Assessment: Other exceptions Cervical / Trunk Exceptions: s/p spine surgery  Communication   Communication: No difficulties  Cognition Arousal/Alertness: Awake/alert Behavior During Therapy: WFL for tasks assessed/performed Overall Cognitive Status: Within Functional Limits for tasks assessed                                        General Comments      Exercises     Assessment/Plan    PT Assessment Patient needs continued PT services  PT Problem List Decreased strength;Decreased mobility;Decreased knowledge of precautions;Pain;Decreased balance;Decreased skin integrity       PT Treatment Interventions Gait training;Therapeutic activities;Therapeutic exercise;Patient/family education;Stair training;Functional mobility training;Balance training    PT Goals (Current goals can be found in the Care Plan section)  Acute Rehab PT Goals 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 PT Goal Formulation: With patient Time For Goal Achievement: 07/07/19 Potential to Achieve Goals: Good    Frequency Min 5X/week   Barriers to discharge        Co-evaluation               AM-PAC PT "6 Clicks" Mobility  Outcome Measure Help needed turning from your back to your side while in a flat bed without using bedrails?: None Help needed moving from lying on your back to sitting on the side of a flat bed without using bedrails?: A Little Help needed moving to and from a bed to a chair (including a wheelchair)?: A Little Help needed standing up from a chair using your arms (e.g., wheelchair or bedside chair)?: A  Little Help needed to walk in hospital room?: A Little Help needed climbing 3-5 steps with a railing? : A Little 6 Click Score: 19    End of Session Equipment Utilized During Treatment: Back brace;Gait belt Activity Tolerance: Patient tolerated treatment well Patient left: in chair;with call bell/phone within reach Nurse Communication: Mobility status PT Visit Diagnosis: Difficulty in walking, not elsewhere classified (R26.2);Other abnormalities of gait and mobility (R26.89);Muscle weakness (generalized) (M62.81)    Time: 1937-9024 PT Time Calculation (min) (ACUTE ONLY): 23 min   Charges:   PT Evaluation $PT Eval Low Complexity: 1 Low PT Treatments $Gait Training: 8-22 mins        Wray Kearns, [place], DPT Acute Rehabilitation Services Pager [phone] Office [place] 06/23/2019, 3:55 PM

## 2019-06-23 NOTE — Transfer of Care (Signed)
Immediate Anesthesia Transfer of Care Note  Patient: Nicholas Lewis  Procedure(s) Performed: Lumbar 4-5 Lumbar 5 Sacral 1 Posterior lumbar interbody fusion (N/A Back)  Patient Location: PACU  Anesthesia Type:General  Level of Consciousness: awake and patient cooperative  Airway & Oxygen Therapy: Patient Spontanous Breathing  Post-op Assessment: Report given to RN and Post -op Vital signs reviewed and stable  Post vital signs: Reviewed and stable  Last Vitals:  Vitals Value Taken Time  BP 152/78 06/23/19 1250  Temp 37.3 C 06/23/19 1250  Pulse 106 06/23/19 1250  Resp 18 06/23/19 1250  SpO2 95 % 06/23/19 1250  Vitals shown include unvalidated device data.  Last Pain:  Vitals:   06/23/19 0630  TempSrc: Oral  PainSc:          Complications: No apparent anesthesia complications

## 2019-06-23 NOTE — Progress Notes (Signed)
Patient ID: Nicholas Lewis, male   DOB: [birth year], 78 y.o.   MRN: [id number] Vital signs are stable Motor function is intact Doing well postop

## 2019-06-23 NOTE — Anesthesia Procedure Notes (Signed)
Procedure Name: Intubation Date/Time: 06/23/2019 7:43 AM Performed by: Lance Coon, CRNA Pre-anesthesia Checklist: Patient identified, Emergency Drugs available, Suction available, Patient being monitored and Timeout performed Patient Re-evaluated:Patient Re-evaluated prior to induction Oxygen Delivery Method: Circle system utilized Preoxygenation: Pre-oxygenation with 100% oxygen Induction Type: IV induction Ventilation: Mask ventilation without difficulty Laryngoscope Size: Miller and 3 Grade View: Grade I Tube type: Oral Tube size: 7.5 mm Number of attempts: 1 Airway Equipment and Method: Stylet Placement Confirmation: ETT inserted through vocal cords under direct vision,  positive ETCO2 and breath sounds checked- equal and bilateral Secured at: 21 cm Tube secured with: Tape Dental Injury: Teeth and Oropharynx as per pre-operative assessment

## 2019-06-23 NOTE — Op Note (Addendum)
Date of surgery: 06/23/2019 Preoperative diagnosis: Spondylolisthesis with radiculopathy L4-5 and L5-S1 Postoperative diagnosis: Same Procedure: Lumbar laminectomy L4-5 and L5-S1 decompression of the L4 the L5 and S1 nerve roots with more work than required for simple posterior interbody technique.  Posterior lumbar interbody arthrodesis L4-5 L5-S1 using peek spacers local autograft allograft and infuse posterior lateral arthrodesis L4 to sacrum with local autograft allograft and infuse pedicle screw fixation L4 to sacrum. Surgeon: Kristeen Miss Assistant: Mauri Pole NP Anesthesia: General endotracheal Indications: Nicholas Lewis is a 79 year old individuals had progressive worsening back and leg pain on the right side he has a degenerative spondylolisthesis that is been progressive over the last number of years is been advised regarding the need for surgery to decompress and stabilize L4 to the sacrum.  Procedure: The patient was brought to the operating room supine on the stretcher.  After the smooth induction of general endotracheal anesthesia he was turned prone.  The back was prepped with alcohol DuraPrep and draped in a sterile fashion midline incision was created and carried down to the lumbodorsal fascia which was opened on either side of midline to expose the spinous process of L5 and S1.  Laminectomies were then created removing the inferior margin lamina of L4 out to the medial wall of facet.  The thickened redundant yellow ligament particularly on the right side was taken down there was noted to be evidence of synovial cyst formation on that right side involving primarily the path of the L5 nerve root.  This was decompressed.  The space was isolated and the L5 nerve root inferiorly was decompressed via laminectomy at L5 which involved removing the entire laminar arch and both facets.  Once this was performed the L5-S1 disc space was isolated and the L5 and the S1 nerve roots were well decompressed.   There is significant subarticular lateral recess stenosis for the S1 nerve root.  With the disc spaces isolated there were then entered and at L4-5 a complete discectomy was created removing the entirety of the disc and the endplates.  The interspace was then sized for appropriate size spacer and it was felt that an 11 x 9 x 23 mm spacer with 8 degrees of lordosis would fit best and L4-5 this was packed with autograft allograft and infuse and a total of 9 cc of graft was packed into the disc space along with the 2 spacers at L5-S1 a similar decompression of the disc was performed and when prepared a 10 x 9 x 23 mm size spacer with 12 degrees lordosis was placed into the interspace along with an additional 9 cc of bone graft.  Once this was completed pedicle entry sites were identified using fluoroscopic guidance and 6.5 x 45 mm screws were placed in the pedicles at L4-L5 and S1.  Short 55 mm precontoured rods were used to connect the screws together.  The intertransverse space from L4 to the sacrum which had been decorticated prior to placement of the pedicle screws was then packed with the remainder of autologous and allograft bone mixture thus completing the posterior lateral arthrodesis.  Once this was accomplished hemostasis in the soft tissues was checked the pads of the L4 the L5 and the S1 nerve roots was checked for their integrity and when this was verified the lumbodorsal fascia was closed with #1 Vicryl interrupted fashion and 2-0 Vicryl was used in subcutaneous tissues and 3-0 Vicryl subcuticularly.  Blood loss for the entire procedure was estimated at 600 cc and  250 cc of Cell Saver blood was returned to the patient.

## 2019-06-24 LAB — BASIC METABOLIC PANEL
Anion gap: 8 (ref 5–15)
BUN: 28 mg/dL — ABNORMAL HIGH (ref 8–23)
CO2: 26 mmol/L (ref 22–32)
Calcium: 8.5 mg/dL — ABNORMAL LOW (ref 8.9–10.3)
Chloride: 106 mmol/L (ref 98–111)
Creatinine, Ser: 1.38 mg/dL — ABNORMAL HIGH (ref 0.61–1.24)
GFR calc Af Amer: 57 mL/min — ABNORMAL LOW (ref >60)
GFR calc non Af Amer: 49 mL/min — ABNORMAL LOW (ref >60)
Glucose, Bld: 181 mg/dL — ABNORMAL HIGH (ref 70–99)
Potassium: 4.9 mmol/L (ref 3.5–5.1)
Sodium: 140 mmol/L (ref 135–145)

## 2019-06-24 LAB — CBC
HCT: 35.1 % — ABNORMAL LOW (ref 39.0–52.0)
Hemoglobin: 11.8 g/dL — ABNORMAL LOW (ref 13.0–17.0)
MCH: 31.1 pg (ref 26.0–34.0)
MCHC: 33.6 g/dL (ref 30.0–36.0)
MCV: 92.4 fL (ref 80.0–100.0)
Platelets: 133 10*3/uL — ABNORMAL LOW (ref 150–400)
RBC: 3.8 MIL/uL — ABNORMAL LOW (ref 4.22–5.81)
RDW: 12.9 % (ref 11.5–15.5)
WBC: 13.7 10*3/uL — ABNORMAL HIGH (ref 4.0–10.5)
nRBC: 0 % (ref 0.0–0.2)

## 2019-06-24 MED FILL — Gelatin Absorbable MT Powder: OROMUCOSAL | Qty: 1 | Status: AC

## 2019-06-24 MED FILL — Thrombin For Soln 5000 Unit: CUTANEOUS | Qty: 5000 | Status: AC

## 2019-06-24 NOTE — Progress Notes (Signed)
Patient ID: Nicholas Lewis, male   DOB: [birth year], 78 y.o.   MRN: [id number] Vital signs are stable Patient has ambulated down the halls and on the stairs Has a fair amount of soreness and some weakness in the left lower extremity May have some leg length discrepancy Appears to be making good progress status post substantial decompression arthrodesis We will plan discharge for tomorrow

## 2019-06-24 NOTE — Progress Notes (Signed)
Physical Therapy Treatment Patient Details Name: Nicholas Lewis MRN: [id number] DOB: 07-21-1941 Today's Date: 06/24/2019    History of Present Illness Patient is a 78 y/o male who presents s/p L4-5, L5-S1 decompression, laminectomy and fusion. PMH includes bil TKAs, HA.    PT Comments    Pt progressing towards physical therapy goals. Was able to perform transfers and ambulation with gross min guard assist to supervision for safety without an AD. Shoes donned for pt comfort. It appears R side of pelvis is elevated, possibly indicating pt may a slight LLD vs muscle imbalance vs pelvic misalignment. Pt would benefit from more thorough PT examination of this issue when appropriate per post-op protocol. Today pt was educated on this recommendation, and also was instructed in car transfer, brace application/wearing schedule, activity progression, and general maintenance of precautions.  .  Follow Up Recommendations  Outpatient PT (when MD feels it is appropriate per post-op protocol);Supervision - Intermittent     Equipment Recommendations  None recommended by PT    Recommendations for Other Services       Precautions / Restrictions Precautions Precautions: Back;Fall Precaution Booklet Issued: Yes (comment) Precaution Comments: Reviewed back precautions and handout Required Braces or Orthoses: Spinal Brace Spinal Brace: Applied in sitting position Restrictions Weight Bearing Restrictions: No    Mobility  Bed Mobility Overal bed mobility: Needs Assistance Bed Mobility: Rolling;Sidelying to Sit Rolling: Min guard Sidelying to sit: Min guard;HOB elevated       General bed mobility comments: Cues for log roll technique; HOB flat, no use of rails to simulate home. Increased time.  Transfers Overall transfer level: Needs assistance Equipment used: None Transfers: Sit to/from Stand Sit to Stand: Supervision         General transfer comment: Light supervision for safety as pt  powered up to full stand. VC's for hand placement on seated surface for safety prior to initiating stand>sit.   Ambulation/Gait Ambulation/Gait assistance: Min guard Gait Distance (Feet): 400 Feet Assistive device: None Gait Pattern/deviations: Step-through pattern;Antalgic;Decreased stride length Gait velocity: decreased Gait velocity interpretation: 1.31 - 2.62 ft/sec, indicative of limited community ambulator General Gait Details: Shoes donned. Pt with less antalgia today however continues to demonstrate a L lateral dip with LLE weight bearing. In standing, pelvis higher on the R side.    Stairs Stairs: Yes Stairs assistance: Min guard Stair Management: One rail Right;Step to pattern;Forwards Number of Stairs: 10 General stair comments: VC's for sequencing and general safety.    Wheelchair Mobility    Modified Rankin (Stroke Patients Only)       Balance Overall balance assessment: Needs assistance Sitting-balance support: Feet supported;No upper extremity supported Sitting balance-Leahy Scale: Good Sitting balance - Comments: Able to donn brace with setup and cues   Standing balance support: During functional activity Standing balance-Leahy Scale: Fair                              Cognition Arousal/Alertness: Awake/alert Behavior During Therapy: WFL for tasks assessed/performed Overall Cognitive Status: Within Functional Limits for tasks assessed                                        Exercises      General Comments        Pertinent Vitals/Pain Pain Assessment: Faces Faces Pain Scale: Hurts a little bit Pain Location: back  at surgical site Pain Descriptors / Indicators: Sore;Operative site guarding    Home Living Family/patient expects to be discharged to:: Private residence Living Arrangements: Spouse/significant other                  Prior Function            PT Goals (current goals can now be found in the care  plan section) Acute Rehab PT Goals 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 PT Goal Formulation: With patient Time For Goal Achievement: 07/07/19 Potential to Achieve Goals: Good Progress towards PT goals: Progressing toward goals    Frequency    Min 5X/week      PT Plan Current plan remains appropriate    Co-evaluation              AM-PAC PT "6 Clicks" Mobility   Outcome Measure  Help needed turning from your back to your side while in a flat bed without using bedrails?: None Help needed moving from lying on your back to sitting on the side of a flat bed without using bedrails?: A Little Help needed moving to and from a bed to a chair (including a wheelchair)?: A Little Help needed standing up from a chair using your arms (e.g., wheelchair or bedside chair)?: A Little Help needed to walk in hospital room?: A Little Help needed climbing 3-5 steps with a railing? : A Little 6 Click Score: 19    End of Session Equipment Utilized During Treatment: Back brace;Gait belt Activity Tolerance: Patient tolerated treatment well Patient left: in chair;with call bell/phone within reach Nurse Communication: Mobility status PT Visit Diagnosis: Difficulty in walking, not elsewhere classified (R26.2);Other abnormalities of gait and mobility (R26.89);Muscle weakness (generalized) (M62.81)     Time: 1610-9604 PT Time Calculation (min) (ACUTE ONLY): 19 min  Charges:  $Gait Training: 8-22 mins                     Rolinda Roan, PT, DPT Acute Rehabilitation Services Pager: [phone] Office: [phone]    Thelma Comp 06/24/2019, 10:07 AM

## 2019-06-24 NOTE — Anesthesia Postprocedure Evaluation (Signed)
Anesthesia Post Note  Patient: [name]  Procedure(s) Performed: Lumbar 4-5 Lumbar 5 Sacral 1 Posterior lumbar interbody fusion (N/A Back)     Patient location during evaluation: PACU Anesthesia Type: General Level of consciousness: awake and alert Pain management: pain level controlled Vital Signs Assessment: post-procedure vital signs reviewed and stable Respiratory status: spontaneous breathing, nonlabored ventilation, respiratory function stable and patient connected to nasal cannula oxygen Cardiovascular status: blood pressure returned to baseline and stable Postop Assessment: no apparent nausea or vomiting Anesthetic complications: no    Last Vitals:  Vitals:   06/24/19 1200 06/24/19 1639  BP: 119/65 124/61  Pulse: 75 84  Resp: 18 18  Temp: 36.6 C 36.7 C  SpO2: 97% 99%    Last Pain:  Vitals:   06/24/19 1643  TempSrc:   PainSc: [place]

## 2019-06-24 NOTE — Progress Notes (Signed)
Occupational Therapy Evaluation Patient Details Name: Nicholas Lewis MRN: [id number] DOB: 03/06/1941 Today's Date: 06/24/2019    History of present illness Patient is a 78 y/o male who presents s/p L4-5, L5-S1 decompression, laminectomy and fusion. PMH includes bil TKAs, HA.   OT comments  Pt s/p procedure listed above. PTA he is independent with ADLs and lives at home with wife, works as a Physicist, medical.  Pt requires min-mod assist for LB ADLs.  He does have AE at home (shoe horn, reacher, long handled sponge) and requires min assist for LB ADLs when using AE.  Therapist demonstrated and educated pt on use of sock aid, which pt verbalized interest in using at home. Pt required supervision for sit<>stand and min guard for functional mobility.  Will continue to follow acutely in order to maximize safety and independence with ADLs prior to return home.   Follow Up Recommendations  No OT follow up;Supervision - Intermittent    Equipment Recommendations  None recommended by OT    Recommendations for Other Services      Precautions / Restrictions Precautions Precautions: Back;Fall Precaution Booklet Issued: Yes (comment)(issued in previous session) Precaution Comments: Reviewed back precautions and handout Required Braces or Orthoses: Spinal Brace Spinal Brace: Applied in sitting position Restrictions Weight Bearing Restrictions: No       Mobility Bed Mobility Overal bed mobility: (pt up in chair) Bed Mobility: Rolling;Sidelying to Sit Rolling: Min guard Sidelying to sit: Min guard;HOB elevated       General bed mobility comments: Cues for log roll technique; HOB flat, no use of rails to simulate home. Increased time.  Transfers Overall transfer level: Needs assistance Equipment used: None Transfers: Sit to/from Stand Sit to Stand: Supervision         General transfer comment: supervision for safety    Balance Overall balance assessment: Needs  assistance Sitting-balance support: Feet supported;No upper extremity supported Sitting balance-Leahy Scale: Good Sitting balance - Comments: Able to donn brace with setup and cues   Standing balance support: During functional activity Standing balance-Leahy Scale: Fair                             ADL either performed or assessed with clinical judgement   ADL Overall ADL's : Needs assistance/impaired Eating/Feeding: Independent;Sitting   Grooming: Supervision/safety;Standing Grooming Details (indicate cue type and reason): Educated pt on use of 2 cups for oral care at sink. Upper Body Bathing: Set up;Sitting   Lower Body Bathing: Minimal assistance;Sit to/from stand   Upper Body Dressing : Set up;Sitting   Lower Body Dressing: Moderate assistance;Sit to/from stand;Minimal assistance(min with AE, mod without AE)   Toilet Transfer: Min guard;Ambulation Toilet Transfer Details (indicate cue type and reason): simulated         Functional mobility during ADLs: Min guard General ADL Comments: Reviewed back precautions and positioning when in bed or recliner. Pt requires min assist to don shoe with reacher and sock aid. Min assist to doff socks with reacher and to don socks with sockaid. Pt verbalized that he is interested in purchasing sock aid and suggesting online purchase via [employer].  He reports his daughter in law has [place] prime and he will have her order.     Vision Baseline Vision/History: Wears glasses Wears Glasses: At all times     Perception     Praxis      Cognition Arousal/Alertness: Awake/alert Behavior During Therapy: [facility] for tasks assessed/performed Overall  Cognitive Status: Within Functional Limits for tasks assessed                                          Exercises     Shoulder Instructions       General Comments      Pertinent Vitals/ Pain       Pain Assessment: Faces Faces Pain Scale: Hurts a little bit Pain  Location: back at surgical site Pain Descriptors / Indicators: Sore;Operative site guarding Pain Intervention(s): Limited activity within patient's tolerance;Monitored during session;Repositioned  Home Living Family/patient expects to be discharged to:: Private residence Living Arrangements: Spouse/significant other Available Help at Discharge: Family;Available 24 hours/day Type of Home: House Home Access: Stairs to enter [employer] of Steps: 1 + 1 Entrance Stairs-Rails: None Home Layout: One level     Bathroom Shower/Tub: Walk-in shower;Door   Bathroom Toilet: Handicapped height Bathroom Accessibility: No   Home Equipment: Environmental consultant - 2 wheels;Bedside commode;Adaptive equipment Adaptive Equipment: Long-handled shoe horn;Long-handled sponge;Reacher        Prior Functioning/Environment Level of Independence: Independent        Comments: Owns a cattle farm   Frequency  Min 2X/week        Progress Toward Goals  OT Goals(current goals can now be found in the care plan section)     Acute Rehab OT Goals 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 OT Goal Formulation: With patient Time For Goal Achievement: 07/08/19 Potential to Achieve Goals: Good  Plan      Co-evaluation                 AM-PAC OT "6 Clicks" Daily Activity     Outcome Measure   Help from another person eating meals?: None Help from another person taking care of personal grooming?: None Help from another person toileting, which includes using toliet, bedpan, or urinal?: A Little(without AE) Help from another person bathing (including washing, rinsing, drying)?: A Lot Help from another person to put on and taking off regular upper body clothing?: A Little Help from another person to put on and taking off regular lower body clothing?: A Lot(without AE) 6 Click Score: 18    End of Session Equipment Utilized During Treatment: Back brace(AE)  OT Visit  Diagnosis: Pain;Unsteadiness on feet (R26.81)   Activity Tolerance Patient tolerated treatment well   Patient Left in chair;with call bell/phone within reach   Nurse Communication Mobility status        Time: 0254-2706 OT Time Calculation (min): 35 min  Charges: OT General Charges $OT Visit: 1 Visit OT Evaluation $OT Eval Low Complexity: 1 Low OT Treatments $Self Care/Home Management : 8-22 mins     Darrol Jump OTR/L [place] [phone] 06/24/2019, 10:40 AM

## 2019-06-25 MED ORDER — METHOCARBAMOL 500 MG PO TABS: 500.0000 mg | ORAL_TABLET | Freq: Four times a day (QID) | ORAL | 3 refills | Status: DC | PRN

## 2019-06-25 MED ORDER — DEXAMETHASONE 4 MG PO TABS
2.0000 mg | ORAL_TABLET | ORAL | Status: AC
  Administered 2019-06-25: 2 mg via ORAL
  Filled 2019-06-25: qty 1

## 2019-06-25 MED ORDER — HYDROCODONE-ACETAMINOPHEN 5-325 MG PO TABS: 1.0000 | ORAL_TABLET | ORAL | 0 refills | Status: DC | PRN

## 2019-06-25 MED ORDER — DEXAMETHASONE 1 MG PO TABS: ORAL_TABLET | ORAL | 0 refills | Status: DC

## 2019-06-25 NOTE — Progress Notes (Signed)
Patient is discharged from room 3C10 at this time. Alert and in stable condition. IV site d/c'd and instructions read to patient with understanding verbalized. Left unit via wheelchair with all belongings at side. 

## 2019-06-25 NOTE — Care Management (Signed)
Discussed PT recs with Dr Ellene Route. Went to patient's room to set up [facility]. He states that he would like to decline HH at this time. No other CM needs.

## 2019-06-25 NOTE — Progress Notes (Signed)
Physical Therapy Treatment Patient Details Name: Nicholas Lewis MRN: [id number] DOB: 1941/12/09 Today's Date: 06/25/2019    History of Present Illness Patient is a 78 y/o male who presents s/p L4-5, L5-S1 decompression, laminectomy and fusion. PMH includes bil TKAs, HA.    PT Comments    Pt progressing towards physical therapy goals. Pt complaining of bilateral calf aches and believes this is from the increased activity he was able to do yesterday (pt reports walking 800' at a time, multiple times yesterday). Pt was instructed in seated calf stretch and educated on decreasing length of walks for now. We reviewed precautions and car transfer this session as well. Will continue to follow.    Follow Up Recommendations  Home health PT;Supervision - Intermittent     Equipment Recommendations  None recommended by PT    Recommendations for Other Services       Precautions / Restrictions Precautions Precautions: Back;Fall Precaution Booklet Issued: Yes (comment)(issued in previous session) Precaution Comments: Reviewed back precautions and handout Required Braces or Orthoses: Spinal Brace Spinal Brace: Applied in sitting position Restrictions Weight Bearing Restrictions: No    Mobility  Bed Mobility               General bed mobility comments: Pt was received sitting up in the chair.   Transfers Overall transfer level: Needs assistance Equipment used: None Transfers: Sit to/from Stand Sit to Stand: Supervision         General transfer comment: Light supervision for safety  Ambulation/Gait Ambulation/Gait assistance: Supervision;Min guard Gait Distance (Feet): 400 Feet Assistive device: None Gait Pattern/deviations: Step-through pattern;Antalgic;Decreased stride length Gait velocity: decreased Gait velocity interpretation: 1.31 - 2.62 ft/sec, indicative of limited community ambulator General Gait Details: Shoes donned. Pt with less antalgia today however  continues to demonstrate a L lateral dip with LLE weight bearing. Pt grossly supervision however one instance where pt required min guard assist.    Stairs             Wheelchair Mobility    Modified Rankin (Stroke Patients Only)       Balance Overall balance assessment: Needs assistance Sitting-balance support: Feet supported;No upper extremity supported Sitting balance-Leahy Scale: Good Sitting balance - Comments: Able to donn brace with setup and cues   Standing balance support: During functional activity Standing balance-Leahy Scale: Fair Standing balance comment: Able to stand statically without Ue support but needs UE support for dynamic standing.                            Cognition Arousal/Alertness: Awake/alert Behavior During Therapy: WFL for tasks assessed/performed Overall Cognitive Status: Within Functional Limits for tasks assessed                                        Exercises      General Comments        Pertinent Vitals/Pain Pain Assessment: Faces Faces Pain Scale: Hurts a little bit Pain Location: back at surgical site Pain Descriptors / Indicators: Sore;Operative site guarding Pain Intervention(s): Monitored during session    Home Living Family/patient expects to be discharged to:: Private residence Living Arrangements: Spouse/significant other Available Help at Discharge: Family;Available 24 hours/day Type of Home: House Home Access: Stairs to enter Entrance Stairs-Rails: None Home Layout: One level Home Equipment: Environmental consultant - 2 wheels;Bedside commode;Adaptive equipment  Prior Function Level of Independence: Independent      Comments: Owns a cattle farm   PT Goals (current goals can now be found in the care plan section) Acute Rehab PT Goals 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 PT Goal Formulation: With patient Time For Goal Achievement: 07/07/19 Potential to  Achieve Goals: Good Progress towards PT goals: Progressing toward goals    Frequency    Min 5X/week      PT Plan Current plan remains appropriate    Co-evaluation              AM-PAC PT "6 Clicks" Mobility   Outcome Measure  Help needed turning from your back to your side while in a flat bed without using bedrails?: None Help needed moving from lying on your back to sitting on the side of a flat bed without using bedrails?: A Little Help needed moving to and from a bed to a chair (including a wheelchair)?: A Little Help needed standing up from a chair using your arms (e.g., wheelchair or bedside chair)?: A Little Help needed to walk in hospital room?: A Little Help needed climbing 3-5 steps with a railing? : A Little 6 Click Score: 19    End of Session Equipment Utilized During Treatment: Back brace;Gait belt Activity Tolerance: Patient tolerated treatment well Patient left: in chair;with call bell/phone within reach Nurse Communication: Mobility status PT Visit Diagnosis: Difficulty in walking, not elsewhere classified (R26.2);Other abnormalities of gait and mobility (R26.89);Muscle weakness (generalized) (M62.81)     Time: 9629-5284 PT Time Calculation (min) (ACUTE ONLY): 22 min  Charges:  $Gait Training: 8-22 mins                     Rolinda Roan, PT, DPT Acute Rehabilitation Services Pager: [phone] Office: [phone]    Thelma Comp 06/25/2019, 9:09 AM

## 2019-06-25 NOTE — Discharge Summary (Signed)
Physician Discharge Summary  Patient ID: Nicholas Lewis MRN: [id number] DOB/AGE: 1941-05-11 78 y.o.  Admit date: 06/23/2019 Discharge date: 06/25/2019  Admission Diagnoses: Lumbar spondylolisthesis with radiculopathy and neurogenic claudication  Discharge Diagnoses: Lumbar spondylolisthesis with radiculopathy neurogenic claudication Active Problems:   Spondylolisthesis of lumbar region   Discharged Condition: good  Hospital Course: Patient was admitted to undergo surgery to decompress L4-5 and L5-S1 for significant and quite severe right lumbar radiculopathy.  He had good relief of his right lower extremity pain.  His incision has been clean and dry.  He is ambulatory.  Consults: None  Significant Diagnostic Studies: None  Treatments: surgery: Posterior lumbar decompression L4-L5 with posterior lateral fusion interbody fusion pedicle screw fixation L4 to the sacrum  Discharge Exam: Blood pressure 134/65, pulse 78, temperature 97.8 F (36.6 C), temperature source Oral, resp. rate 18, height 5\' 11"  (1.803 m), weight 102.1 kg, SpO2 96 %. Incision is clean and dry motor function is intact Station and gait are intact  Disposition: Discharge disposition: 01-Home or Self Care       Discharge Instructions    Call MD for:  redness, tenderness, or signs of infection (pain, swelling, redness, odor or green/yellow discharge around incision site)   Complete by: As directed    Call MD for:  severe uncontrolled pain   Complete by: As directed    Call MD for:  temperature >100.4   Complete by: As directed    Diet - low sodium heart healthy   Complete by: As directed    Discharge instructions   Complete by: As directed    Okay to shower. Do not apply salves or appointments to incision. No heavy lifting with the upper extremities greater than 15 pounds. May resume driving when not requiring pain medication and patient feels comfortable with doing so.   Incentive spirometry RT   Complete  by: As directed    Increase activity slowly   Complete by: As directed      Allergies as of 06/25/2019      Reactions   Sulfa Antibiotics Other (See Comments)   Dizziness and vision problems   Mobic [meloxicam] Rash   Percocet [oxycodone-acetaminophen] Other (See Comments)   Very sleepy. Sensitive to all narcotics.      Medication List    TAKE these medications   allopurinol 300 MG tablet Commonly known as: ZYLOPRIM Take 150 mg by mouth every morning.   aspirin EC 81 MG tablet Take 81 mg by mouth daily.   dexamethasone 1 MG tablet Commonly known as: DECADRON 2 tablets twice daily for 2 days, one tablet twice daily for 2 days, one tablet daily for 2 days.   gabapentin 300 MG capsule Commonly known as: NEURONTIN Take 300 mg by mouth 2 (two) times a day.   HYDROcodone-acetaminophen 5-325 MG tablet Commonly known as: NORCO/VICODIN Take 1-2 tablets by mouth every 4 (four) hours as needed for moderate pain or severe pain.   levothyroxine 112 MCG tablet Commonly known as: SYNTHROID Take 112 mcg by mouth daily before breakfast.   losartan 50 MG tablet Commonly known as: COZAAR Take 100 mg by mouth daily.   methocarbamol 500 MG tablet Commonly known as: ROBAXIN Take 1 tablet (500 mg total) by mouth every 6 (six) hours as needed for muscle spasms.   metoprolol tartrate 25 MG tablet Commonly known as: LOPRESSOR Take 25 mg by mouth 2 (two) times daily.   multivitamin with minerals Tabs tablet Take 1 tablet by mouth daily.  vitamin C 500 MG tablet Commonly known as: ASCORBIC ACID Take 500 mg by mouth daily.        Signed: Blanchie Dessert Oneil Behney 06/25/2019, 10:13 AM

## 2019-06-25 NOTE — Discharge Instructions (Signed)

## 2019-06-26 MED FILL — Heparin Sodium (Porcine) Inj 1000 Unit/ML: INTRAMUSCULAR | Qty: 30 | Status: AC

## 2019-06-26 MED FILL — Sodium Chloride IV Soln 0.9%: INTRAVENOUS | Qty: 2000 | Status: AC

## 2019-07-10 DIAGNOSIS — M4316 Spondylolisthesis, lumbar region: Secondary | ICD-10-CM | POA: Diagnosis not present

## 2019-07-14 ENCOUNTER — Encounter: Payer: Self-pay | Admitting: *Deleted

## 2019-08-14 DIAGNOSIS — M4316 Spondylolisthesis, lumbar region: Secondary | ICD-10-CM | POA: Diagnosis not present

## 2019-08-14 DIAGNOSIS — S91203A Unspecified open wound of unspecified great toe with damage to nail, initial encounter: Secondary | ICD-10-CM | POA: Diagnosis not present

## 2019-08-14 DIAGNOSIS — Z6835 Body mass index (BMI) 35.0-35.9, adult: Secondary | ICD-10-CM | POA: Diagnosis not present

## 2019-08-14 DIAGNOSIS — R238 Other skin changes: Secondary | ICD-10-CM | POA: Diagnosis not present

## 2019-08-19 DIAGNOSIS — Z6834 Body mass index (BMI) 34.0-34.9, adult: Secondary | ICD-10-CM | POA: Diagnosis not present

## 2019-08-19 DIAGNOSIS — S90822D Blister (nonthermal), left foot, subsequent encounter: Secondary | ICD-10-CM | POA: Diagnosis not present

## 2019-08-19 DIAGNOSIS — R7309 Other abnormal glucose: Secondary | ICD-10-CM | POA: Diagnosis not present

## 2019-08-19 DIAGNOSIS — E6609 Other obesity due to excess calories: Secondary | ICD-10-CM | POA: Diagnosis not present

## 2019-08-27 DIAGNOSIS — Z0120 Encounter for dental examination and cleaning without abnormal findings: Secondary | ICD-10-CM | POA: Diagnosis not present

## 2019-09-21 DIAGNOSIS — H35033 Hypertensive retinopathy, bilateral: Secondary | ICD-10-CM | POA: Diagnosis not present

## 2019-09-21 DIAGNOSIS — H04123 Dry eye syndrome of bilateral lacrimal glands: Secondary | ICD-10-CM | POA: Diagnosis not present

## 2019-09-21 DIAGNOSIS — H2513 Age-related nuclear cataract, bilateral: Secondary | ICD-10-CM | POA: Diagnosis not present

## 2019-09-21 DIAGNOSIS — H25013 Cortical age-related cataract, bilateral: Secondary | ICD-10-CM | POA: Diagnosis not present

## 2019-09-21 DIAGNOSIS — R69 Illness, unspecified: Secondary | ICD-10-CM | POA: Diagnosis not present

## 2019-10-12 DIAGNOSIS — M4316 Spondylolisthesis, lumbar region: Secondary | ICD-10-CM | POA: Diagnosis not present

## 2019-11-10 DIAGNOSIS — E890 Postprocedural hypothyroidism: Secondary | ICD-10-CM | POA: Diagnosis not present

## 2019-11-10 DIAGNOSIS — S81012A Laceration without foreign body, left knee, initial encounter: Secondary | ICD-10-CM | POA: Diagnosis not present

## 2019-11-10 DIAGNOSIS — Z6835 Body mass index (BMI) 35.0-35.9, adult: Secondary | ICD-10-CM | POA: Diagnosis not present

## 2019-11-10 DIAGNOSIS — M109 Gout, unspecified: Secondary | ICD-10-CM | POA: Diagnosis not present

## 2019-11-10 DIAGNOSIS — E6609 Other obesity due to excess calories: Secondary | ICD-10-CM | POA: Diagnosis not present

## 2019-12-21 ENCOUNTER — Other Ambulatory Visit: Payer: Self-pay

## 2019-12-21 ENCOUNTER — Ambulatory Visit: Payer: Medicare Other | Attending: Internal Medicine

## 2019-12-21 DIAGNOSIS — Z20828 Contact with and (suspected) exposure to other viral communicable diseases: Secondary | ICD-10-CM | POA: Diagnosis not present

## 2019-12-21 DIAGNOSIS — Z20822 Contact with and (suspected) exposure to covid-19: Secondary | ICD-10-CM

## 2019-12-22 LAB — NOVEL CORONAVIRUS, NAA: SARS-CoV-2, NAA: NOT DETECTED

## 2020-01-22 DIAGNOSIS — M48061 Spinal stenosis, lumbar region without neurogenic claudication: Secondary | ICD-10-CM | POA: Diagnosis not present

## 2020-02-04 DIAGNOSIS — E041 Nontoxic single thyroid nodule: Secondary | ICD-10-CM | POA: Diagnosis not present

## 2020-02-04 DIAGNOSIS — Z125 Encounter for screening for malignant neoplasm of prostate: Secondary | ICD-10-CM | POA: Diagnosis not present

## 2020-02-04 DIAGNOSIS — E890 Postprocedural hypothyroidism: Secondary | ICD-10-CM | POA: Diagnosis not present

## 2020-02-04 DIAGNOSIS — Z6835 Body mass index (BMI) 35.0-35.9, adult: Secondary | ICD-10-CM | POA: Diagnosis not present

## 2020-02-04 DIAGNOSIS — I10 Essential (primary) hypertension: Secondary | ICD-10-CM | POA: Diagnosis not present

## 2020-02-04 DIAGNOSIS — Z1389 Encounter for screening for other disorder: Secondary | ICD-10-CM | POA: Diagnosis not present

## 2020-02-04 DIAGNOSIS — Z0000 Encounter for general adult medical examination without abnormal findings: Secondary | ICD-10-CM | POA: Diagnosis not present

## 2020-03-16 DIAGNOSIS — Z0120 Encounter for dental examination and cleaning without abnormal findings: Secondary | ICD-10-CM | POA: Diagnosis not present

## 2020-04-06 DIAGNOSIS — T07XXXA Unspecified multiple injuries, initial encounter: Secondary | ICD-10-CM | POA: Diagnosis not present

## 2020-04-06 DIAGNOSIS — Z6835 Body mass index (BMI) 35.0-35.9, adult: Secondary | ICD-10-CM | POA: Diagnosis not present

## 2020-04-06 DIAGNOSIS — W57XXXA Bitten or stung by nonvenomous insect and other nonvenomous arthropods, initial encounter: Secondary | ICD-10-CM | POA: Diagnosis not present

## 2020-04-06 DIAGNOSIS — E6609 Other obesity due to excess calories: Secondary | ICD-10-CM | POA: Diagnosis not present

## 2020-04-20 DIAGNOSIS — H11432 Conjunctival hyperemia, left eye: Secondary | ICD-10-CM | POA: Diagnosis not present

## 2020-07-05 ENCOUNTER — Ambulatory Visit: Payer: Medicare Other | Admitting: Dermatology

## 2020-07-05 ENCOUNTER — Other Ambulatory Visit: Payer: Self-pay

## 2020-07-05 DIAGNOSIS — D485 Neoplasm of uncertain behavior of skin: Secondary | ICD-10-CM

## 2020-07-05 DIAGNOSIS — L570 Actinic keratosis: Secondary | ICD-10-CM

## 2020-07-05 DIAGNOSIS — L729 Follicular cyst of the skin and subcutaneous tissue, unspecified: Secondary | ICD-10-CM

## 2020-07-05 DIAGNOSIS — D0422 Carcinoma in situ of skin of left ear and external auricular canal: Secondary | ICD-10-CM | POA: Diagnosis not present

## 2020-07-05 DIAGNOSIS — D225 Melanocytic nevi of trunk: Secondary | ICD-10-CM | POA: Diagnosis not present

## 2020-07-05 DIAGNOSIS — C4492 Squamous cell carcinoma of skin, unspecified: Secondary | ICD-10-CM

## 2020-07-05 DIAGNOSIS — D229 Melanocytic nevi, unspecified: Secondary | ICD-10-CM

## 2020-07-05 HISTORY — DX: Squamous cell carcinoma of skin, unspecified: C44.92

## 2020-07-07 ENCOUNTER — Telehealth: Payer: Self-pay

## 2020-07-07 NOTE — Telephone Encounter (Signed)
Phone call from patient returning our call. Pathology results given to patient.  

## 2020-07-07 NOTE — Telephone Encounter (Signed)
-----   Message from [name], MD sent at 07/07/2020  6:25 AM EDT ----- Schedule surgery with Dr. Darene Lamer

## 2020-07-07 NOTE — Telephone Encounter (Signed)
Phone call to patient with his pathology results. Voicemail left for patient to give the office a call back.  ?

## 2020-07-24 ENCOUNTER — Encounter: Payer: Self-pay | Admitting: Dermatology

## 2020-07-24 NOTE — Progress Notes (Signed)
   Follow-Up Visit   Subjective  Nicholas Lewis is a 79 y.o. male who presents for the following: Annual Exam (spots on face and ear).  New growths Location: Face and ear Duration:  Quality:  Associated Signs/Symptoms: Modifying Factors:  Severity:  Timing: Context: History of multiple skin cancers and atypical moles  Objective  Well appearing patient in no apparent distress; mood and affect are within normal limits.  All skin waist up examined.   Assessment & Plan  Skin cancer screening performed today.   Neoplasm of uncertain behavior of skin (2) Left Mid Ear Rim  Skin / nail biopsy Type of biopsy: tangential   Informed consent: discussed and consent obtained   Timeout: patient name, date of birth, surgical site, and procedure verified   Procedure prep:  Patient was prepped and draped in usual sterile fashion Prep type:  Chlorhexidine Anesthesia: the lesion was anesthetized in a standard fashion   Anesthetic:  1% lidocaine w/ epinephrine 1-100,000 local infiltration Instrument used: flexible razor blade   Hemostasis achieved with: ferric subsulfate   Outcome: patient tolerated procedure well   Post-procedure details: wound care instructions given    Specimen 1 - Surgical pathology Differential Diagnosis: BCC vs SCC Check Margins: No  Left upper Crus of Antihelix  Skin / nail biopsy Type of biopsy: tangential   Informed consent: discussed and consent obtained   Timeout: patient name, date of birth, surgical site, and procedure verified   Procedure prep:  Patient was prepped and draped in usual sterile fashion Prep type:  Chlorhexidine Anesthesia: the lesion was anesthetized in a standard fashion   Anesthetic:  1% lidocaine w/ epinephrine 1-100,000 local infiltration Instrument used: flexible razor blade   Hemostasis achieved with: ferric subsulfate   Outcome: patient tolerated procedure well   Post-procedure details: wound care instructions given     Specimen 2 - Surgical pathology Differential Diagnosis: BCC vs SCC Check Margins: No  AK (actinic keratosis) Left Buccal Cheek   Destruction of lesion - Left Buccal Cheek  Complexity: simple   Destruction method: cryotherapy   Informed consent: discussed and consent obtained   Timeout:  patient name, date of birth, surgical site, and procedure verified Lesion destroyed using liquid nitrogen: Yes   Region frozen until ice ball extended beyond lesion: Yes   Cryotherapy cycles:  5 Outcome: patient tolerated procedure well with no complications    Cyst of skin Left Forehead  Leave if stable  Nevus Mid Back  Annual skin examination     I, [name], MD, have reviewed all documentation for this visit.  The documentation on 07/24/20 for the exam, diagnosis, procedures, and orders are all accurate and complete.

## 2020-07-25 DIAGNOSIS — H43811 Vitreous degeneration, right eye: Secondary | ICD-10-CM | POA: Diagnosis not present

## 2020-07-25 DIAGNOSIS — H4311 Vitreous hemorrhage, right eye: Secondary | ICD-10-CM | POA: Diagnosis not present

## 2020-07-25 DIAGNOSIS — H35033 Hypertensive retinopathy, bilateral: Secondary | ICD-10-CM | POA: Diagnosis not present

## 2020-07-29 DIAGNOSIS — J069 Acute upper respiratory infection, unspecified: Secondary | ICD-10-CM | POA: Diagnosis not present

## 2020-08-06 ENCOUNTER — Emergency Department (HOSPITAL_COMMUNITY)
Admission: EM | Admit: 2020-08-06 | Discharge: 2020-08-06 | Disposition: A | Discharge status: Home / Self Care | Payer: Medicare Other | Attending: Emergency Medicine | Admitting: Emergency Medicine

## 2020-08-06 ENCOUNTER — Encounter (HOSPITAL_COMMUNITY): Payer: Self-pay

## 2020-08-06 ENCOUNTER — Ambulatory Visit: Payer: Self-pay

## 2020-08-06 ENCOUNTER — Other Ambulatory Visit: Payer: Self-pay

## 2020-08-06 ENCOUNTER — Emergency Department (HOSPITAL_COMMUNITY): Payer: Medicare Other

## 2020-08-06 DIAGNOSIS — D440 Neoplasm of uncertain behavior of thyroid gland: Secondary | ICD-10-CM | POA: Diagnosis not present

## 2020-08-06 DIAGNOSIS — Z96653 Presence of artificial knee joint, bilateral: Secondary | ICD-10-CM | POA: Insufficient documentation

## 2020-08-06 DIAGNOSIS — I10 Essential (primary) hypertension: Secondary | ICD-10-CM | POA: Diagnosis not present

## 2020-08-06 DIAGNOSIS — M791 Myalgia, unspecified site: Secondary | ICD-10-CM | POA: Diagnosis present

## 2020-08-06 DIAGNOSIS — Z79899 Other long term (current) drug therapy: Secondary | ICD-10-CM | POA: Diagnosis not present

## 2020-08-06 DIAGNOSIS — U071 COVID-19: Secondary | ICD-10-CM | POA: Insufficient documentation

## 2020-08-06 DIAGNOSIS — R0602 Shortness of breath: Secondary | ICD-10-CM | POA: Diagnosis not present

## 2020-08-06 DIAGNOSIS — R531 Weakness: Secondary | ICD-10-CM | POA: Diagnosis not present

## 2020-08-06 LAB — COMPREHENSIVE METABOLIC PANEL
ALT: 38 U/L (ref 0–44)
AST: 51 U/L — ABNORMAL HIGH (ref 15–41)
Albumin: 3.4 g/dL — ABNORMAL LOW (ref 3.5–5.0)
Alkaline Phosphatase: 99 U/L (ref 38–126)
Anion gap: 8 (ref 5–15)
BUN: 14 mg/dL (ref 8–23)
CO2: 26 mmol/L (ref 22–32)
Calcium: 8.1 mg/dL — ABNORMAL LOW (ref 8.9–10.3)
Chloride: 105 mmol/L (ref 98–111)
Creatinine, Ser: 0.92 mg/dL (ref 0.61–1.24)
GFR calc Af Amer: >60 mL/min (ref >60)
GFR calc non Af Amer: >60 mL/min (ref >60)
Glucose, Bld: 121 mg/dL — ABNORMAL HIGH (ref 70–99)
Potassium: 4.2 mmol/L (ref 3.5–5.1)
Sodium: 139 mmol/L (ref 135–145)
Total Bilirubin: 0.9 mg/dL (ref 0.3–1.2)
Total Protein: 6.4 g/dL — ABNORMAL LOW (ref 6.5–8.1)

## 2020-08-06 LAB — CBC WITH DIFFERENTIAL/PLATELET
Abs Immature Granulocytes: 0.04 10*3/uL (ref 0.00–0.07)
Basophils Absolute: 0 10*3/uL (ref 0.0–0.1)
Basophils Relative: 0 %
Eosinophils Absolute: 0 10*3/uL (ref 0.0–0.5)
Eosinophils Relative: 0 %
HCT: 42.3 % (ref 39.0–52.0)
Hemoglobin: 14.2 g/dL (ref 13.0–17.0)
Immature Granulocytes: 1 %
Lymphocytes Relative: 19 %
Lymphs Abs: 1.7 10*3/uL (ref 0.7–4.0)
MCH: 30.3 pg (ref 26.0–34.0)
MCHC: 33.6 g/dL (ref 30.0–36.0)
MCV: 90.4 fL (ref 80.0–100.0)
Monocytes Absolute: 0.6 10*3/uL (ref 0.1–1.0)
Monocytes Relative: 6 %
Neutro Abs: 6.6 10*3/uL (ref 1.7–7.7)
Neutrophils Relative %: 74 %
Platelets: 127 10*3/uL — ABNORMAL LOW (ref 150–400)
RBC: 4.68 MIL/uL (ref 4.22–5.81)
RDW: 13 % (ref 11.5–15.5)
WBC: 8.9 10*3/uL (ref 4.0–10.5)
nRBC: 0 % (ref 0.0–0.2)

## 2020-08-06 LAB — URINALYSIS, ROUTINE W REFLEX MICROSCOPIC
Bilirubin Urine: NEGATIVE
Glucose, UA: NEGATIVE mg/dL
Ketones, ur: NEGATIVE mg/dL
Leukocytes,Ua: NEGATIVE
Nitrite: NEGATIVE
Protein, ur: 30 mg/dL — AB
Specific Gravity, Urine: 1.024 (ref 1.005–1.030)
pH: 5 (ref 5.0–8.0)

## 2020-08-06 LAB — SARS CORONAVIRUS 2 BY RT PCR (HOSPITAL ORDER, PERFORMED IN ~~LOC~~ HOSPITAL LAB): SARS Coronavirus 2: POSITIVE — AB

## 2020-08-06 MED ORDER — ALBUTEROL SULFATE HFA 108 (90 BASE) MCG/ACT IN AERS
2.0000 | INHALATION_SPRAY | Freq: Once | RESPIRATORY_TRACT | Status: AC
  Administered 2020-08-06: 2 via RESPIRATORY_TRACT
  Filled 2020-08-06: qty 6.7

## 2020-08-06 MED ORDER — DOXYCYCLINE HYCLATE 100 MG PO CAPS
100.0000 mg | ORAL_CAPSULE | Freq: Two times a day (BID) | ORAL | 0 refills | Status: DC
Start: 2020-08-06 — End: 2020-09-08

## 2020-08-06 NOTE — ED Provider Notes (Signed)
[facility] Provider Note   CSN: [id number] Arrival date & time: 08/06/20  1202     History Chief Complaint  Patient presents with  . Generalized Body Aches  . Covid Exposure    positive    Nicholas Lewis is a 79 y.o. male.  Patient with cough and shortness of breath.  Patient had a positive Covid test recently  The history is provided by the patient. No language interpreter was used.  Shortness of Breath Severity:  Mild Onset quality:  Sudden Timing:  Constant Progression:  Waxing and waning Chronicity:  Recurrent Context: activity   Relieved by:  Nothing Worsened by:  Nothing Ineffective treatments:  None tried Associated symptoms: no abdominal pain, no chest pain, no cough, no headaches and no rash        Past Medical History:  Diagnosis Date  . Abrasion    l arm  . Arthritis   . Atypical nevus 10/11/1997   medial left midcalf-mild (Dr. [place] Crane )  . Atypical nevus 11/24/2002   right back upper- marked (exc)  . Cellulitis 07/2012   after surgery for left knee  . Complication of anesthesia    slow to wake up  . Dog scratch 01/14/2015    office visit note from PCP on chart( to lower lip)   . Gout   . Hypertension   . Pneumonia    hx of 2015   . SCC (squamous cell carcinoma) 12/17/2012   forehead (CX35FU)  . SCC (squamous cell carcinoma) 03/19/2018   right sideburn,sup (CX3FU)   . SCC (squamous cell carcinoma) 03/19/2018   above right eyebrow (CX35FU)  . SCC (squamous cell carcinoma) x 2 05/31/2014   right sideburn (Cx35FU),right shoulder (CX35FU)  . SCCA (squamous cell carcinoma) of skin 07/05/2020   Left Mid Ear Rim (in situ)  . SCCA (squamous cell carcinoma) of skin 07/05/2020   Left Upper Crus of Antihelix (in situ)  . scca in situ 11/21/2004   left sideburn (CX35FU)    Patient Active Problem List   Diagnosis Date Noted  . Spondylolisthesis of lumbar region 06/23/2019  . Lumbar pain 06/30/2018  . Degenerative  spondylolisthesis 06/30/2018  . Spinal stenosis of lumbar region 06/30/2018  . Lumbar radiculopathy 05/27/2018  . Family history of colon cancer 12/27/2015  . History of adenomatous polyp of colon 12/27/2015  . Neoplasm of uncertain behavior of thyroid gland 01/20/2015  . Stiffness of joint, lower leg 10/15/2014  . Difficulty walking 10/15/2014  . Weakness of right leg 10/15/2014  . OA (osteoarthritis) of knee 09/20/2014  . Knee stiffness 09/09/2012  . S/P left TKA 08/09/2012    Past Surgical History:  Procedure Laterality Date  . BACK SURGERY    . COLONOSCOPY  11/10/2010   RMR: internal hemorrhoids , anal papilla, otherwise normal rectum.  Long tortuous colon , polyps  in the cecum and sigmoid segment status post removeal as described above.   . COLONOSCOPY N/A 01/05/2016   Dr.Rourk- normal colonoscopy (redundant colon)   . CYSTECTOMY  2003   from spine  . JOINT REPLACEMENT    . KNEE ARTHROSCOPY  2000   left  . THYROID LOBECTOMY Left 01/20/2015   Procedure: LEFT THYROID LOBECTOMY;  Surgeon: Armandina Gemma, MD;  Location: [facility];  Service: General;  Laterality: Left;  . TOTAL KNEE ARTHROPLASTY  08/06/2012   Procedure: TOTAL KNEE ARTHROPLASTY;  Surgeon: Magnus Sinning, MD;  Location: [facility];  Service: Orthopedics;  Laterality: Left;  . TOTAL  KNEE ARTHROPLASTY Right 09/20/2014   Procedure: RIGHT TOTAL KNEE ARTHROPLASTY;  Surgeon: Gearlean Alf, MD;  Location: [facility];  Service: Orthopedics;  Laterality: Right;       Family History  Problem Relation Age of Onset  . Colon cancer Father 22    Social History   Tobacco Use  . Smoking status: Never Smoker  . Smokeless tobacco: Never Used  Vaping Use  . Vaping Use: Never used  Substance Use Topics  . Alcohol use: No  . Drug use: No    Home Medications Prior to Admission medications   Medication Sig Start Date End Date Taking? Authorizing Provider  acetaminophen (TYLENOL) 325 MG tablet Take 650 mg by mouth every 6 (six) hours  as needed.   Yes [provider]  amoxicillin-clavulanate (AUGMENTIN) 875-125 MG tablet Take 1 tablet by mouth 2 (two) times daily. 07/29/20  Yes [provider]  aspirin EC 81 MG tablet Take 81 mg by mouth daily.    Yes [provider]  levothyroxine (SYNTHROID, LEVOTHROID) 112 MCG tablet Take 112 mcg by mouth daily before breakfast.  01/18/17  Yes [provider]  losartan (COZAAR) 50 MG tablet Take 100 mg by mouth daily.  03/21/19  Yes [provider]  metoprolol tartrate (LOPRESSOR) 25 MG tablet Take 25 mg by mouth 2 (two) times daily.  01/17/17  Yes [provider]  Multiple Vitamin (MULTIVITAMIN WITH MINERALS) TABS tablet Take 1 tablet by mouth daily.   Yes [provider]  vitamin C (ASCORBIC ACID) 500 MG tablet Take 500 mg by mouth daily.   Yes [provider]    Allergies    Sulfa antibiotics, Mobic [meloxicam], and Percocet [oxycodone-acetaminophen]  Review of Systems   Review of Systems  Constitutional: Negative for appetite change and fatigue.  HENT: Negative for congestion, ear discharge and sinus pressure.   Eyes: Negative for discharge.  Respiratory: Positive for shortness of breath. Negative for cough.   Cardiovascular: Negative for chest pain.  Gastrointestinal: Negative for abdominal pain and diarrhea.  Genitourinary: Negative for frequency and hematuria.  Musculoskeletal: Negative for back pain.  Skin: Negative for rash.  Neurological: Negative for seizures and headaches.  Psychiatric/Behavioral: Negative for hallucinations.    Physical Exam Updated Vital Signs BP (!) 152/81 (BP Location: Right Arm)   Pulse 69   Temp 100 F (37.8 C) (Oral)   Ht 5\' 11"  (1.803 m)   Wt 99.8 kg   SpO2 94%   BMI 30.68 kg/m   Physical Exam Vitals and nursing note reviewed.  Constitutional:      Appearance: He is well-developed.  HENT:     Head: Normocephalic.     Nose: Nose normal.  Eyes:     General: No  scleral icterus.    Conjunctiva/sclera: Conjunctivae normal.  Neck:     Thyroid: No thyromegaly.  Cardiovascular:     Rate and Rhythm: Normal rate and regular rhythm.     Heart sounds: No murmur heard.  No friction rub. No gallop.   Pulmonary:     Breath sounds: No stridor. Wheezing present. No rales.  Chest:     Chest wall: No tenderness.  Abdominal:     General: There is no distension.     Tenderness: There is no abdominal tenderness. There is no rebound.  Musculoskeletal:        General: Normal range of motion.     Cervical back: Neck supple.  Lymphadenopathy:     Cervical: No cervical  adenopathy.  Skin:    Findings: No erythema or rash.  Neurological:     Mental Status: He is alert and oriented to person, place, and time.     Motor: No abnormal muscle tone.     Coordination: Coordination normal.  Psychiatric:        Behavior: Behavior normal.     ED Results / Procedures / Treatments   Labs (all labs ordered are listed, but only abnormal results are displayed) Labs Reviewed  SARS CORONAVIRUS 2 BY RT PCR (HOSPITAL ORDER, [facility]) - Abnormal; Notable for the following components:      Result Value   SARS Coronavirus 2 POSITIVE (*)    All other components within normal limits  CBC WITH DIFFERENTIAL/PLATELET - Abnormal; Notable for the following components:   Platelets 127 (*)    All other components within normal limits  COMPREHENSIVE METABOLIC PANEL - Abnormal; Notable for the following components:   Glucose, Bld 121 (*)    Calcium 8.1 (*)    Total Protein 6.4 (*)    Albumin 3.4 (*)    AST 51 (*)    All other components within normal limits  URINALYSIS, ROUTINE W REFLEX MICROSCOPIC - Abnormal; Notable for the following components:   Hgb urine dipstick SMALL (*)    Protein, ur 30 (*)    Bacteria, UA RARE (*)    All other components within normal limits    EKG None  Radiology DG Chest Port 1 View  Result Date:  08/06/2020 CLINICAL DATA:  Shortness of breath. Body aches, fever, weakness. History of Covid-19 exposure. Symptoms for 1 week. EXAM: PORTABLE CHEST 1 VIEW COMPARISON:  12/18/2016 FINDINGS: Heart size is accentuated by technique, probably UPPER limits normal. There are patchy opacities in the lung bases and LEFT mid lung zone, LEFT greater than RIGHT. No pulmonary edema. No pleural effusions. IMPRESSION: Bilateral lower lobe and LEFT mid lung zone infiltrates. Electronically Signed   By: Nolon Nations M.D.   On: 08/06/2020 13:29    Procedures Procedures (including critical care time)  Medications Ordered in ED Medications  albuterol (VENTOLIN HFA) 108 (90 Base) MCG/ACT inhaler 2 puff (2 puffs Inhalation Given 08/06/20 1311)    ED Course  I have reviewed the triage vital signs and the nursing notes.  Pertinent labs & imaging results that were available during my care of the patient were reviewed by me and considered in my medical decision making (see chart for details).    MDM Rules/Calculators/A&P                         Patient with positive Covid and mild hypoxia.  Patient improved with albuterol inhaler.  Patient did get his vaccines.  He is referred to the infusion clinic   This patient presents to the ED for concern of shortness of breath.  Patient could have pneumonia or COVID-19   Lab Tests:   I Ordered, reviewed, and interpreted labs, which included CBC chemistries unremarkable  Medicines ordered:   I ordered medication albuterol for shortness of breath  Imaging Studies ordered:   I ordered imaging studies which included chest x-ray and  I independently visualized and interpreted imaging which showed bilateral lower lobe pneumonia  Additional history obtained:   Additional history obtained from records  Previous records obtained and reviewed.  Consultations Obtained:     Reevaluation:  After the interventions stated above, I reevaluated the patient and  found  improved  Critical Interventions:  .          Lab Tests:   Critical Interventions:  .   Final Clinical Impression(s) / ED Diagnoses Final diagnoses:  [id number]    Rx / DC Orders ED Discharge Orders    None       Milton Ferguson, MD 08/09/20 1048

## 2020-08-06 NOTE — Discharge Instructions (Addendum)
I have contacted the infusion clinic for Covid.  Phone number is [phone].  They should get in touch with you Monday or Tuesday to set up possible IV infusion.  Return to the emergency department very family doctor if getting worse.  Use the albuterol 2 puffs every 4 hours if needed for shortness of breath

## 2020-08-06 NOTE — ED Triage Notes (Signed)
Pt to er, pt states that Sunday he was dx with covid.  Pt states that he has been resting all week, states that he has a non productive cough.  Pt states that he is here today because he isn't getting any better, states that when he has a coughing spell he feels more sob.  Pt 91% on room air, pt placed on 2L O2 via [place], pt satting 94% on 2L

## 2020-08-07 ENCOUNTER — Other Ambulatory Visit (HOSPITAL_COMMUNITY): Payer: Self-pay | Admitting: Oncology

## 2020-08-07 ENCOUNTER — Encounter: Payer: Self-pay | Admitting: Oncology

## 2020-08-07 DIAGNOSIS — U071 COVID-19: Secondary | ICD-10-CM

## 2020-08-07 NOTE — Progress Notes (Signed)
Orders in for MAb infusion.   Faythe Casa, NP 08/07/2020 8:44 AM

## 2020-08-07 NOTE — Progress Notes (Signed)
I connected by phone with   Mr. Benzel on 08/07/2020 at 8:30 to discuss the potential use of an new treatment for mild to moderate COVID-19 viral infection in non-hospitalized patients.   This patient is a age/sex that meets the FDA criteria for Emergency Use Authorization of casirivimab\imdevimab.  Has a (+) direct SARS-CoV-2 viral test result 1. Has mild or moderate COVID-19  2. Is ? 78 years of age and weighs ? 40 kg 3. Is NOT hospitalized due to COVID-19 4. Is NOT requiring oxygen therapy or requiring an increase in baseline oxygen flow rate due to COVID-19 5. Is within 10 days of symptom onset 6. Has at least one of the high risk factor(s) for progression to severe COVID-19 and/or hospitalization as defined in EUA. ? Specific high risk criteria :Age   Symptom onset  07/31/2020.   I have spoken and communicated the following to the patient or parent/caregiver:   1. FDA has authorized the emergency use of casirivimab\imdevimab for the treatment of mild to moderate COVID-19 in adults and pediatric patients with positive results of direct SARS-CoV-2 viral testing who are 38 years of age and older weighing at least 40 kg, and who are at high risk for progressing to severe COVID-19 and/or hospitalization.   2. The significant known and potential risks and benefits of casirivimab\imdevimab, and the extent to which such potential risks and benefits are unknown.   3. Information on available alternative treatments and the risks and benefits of those alternatives, including clinical trials.   4. Patients treated with casirivimab\imdevimab should continue to self-isolate and use infection control measures (e.g., wear mask, isolate, social distance, avoid sharing personal items, clean and disinfect "high touch" surfaces, and frequent handwashing) according to CDC guidelines.    5. The patient or parent/caregiver has the option to accept or refuse casirivimab\imdevimab .   After reviewing this  information with the patient, The patient agreed to proceed with receiving casirivimab\imdevimab infusion and will be provided a copy of the Fact sheet prior to receiving the infusion.Rulon Abide, AGNP-C [phone] ([place])

## 2020-08-08 MED ORDER — SODIUM CHLORIDE 0.9 % IV SOLN
1200.0000 mg | Freq: Once | INTRAVENOUS | Status: AC
  Administered 2020-08-09: 1200 mg via INTRAVENOUS
  Filled 2020-08-08: qty 1200

## 2020-08-09 ENCOUNTER — Ambulatory Visit (HOSPITAL_COMMUNITY)
Admission: RE | Admit: 2020-08-09 | Discharge: 2020-08-09 | Disposition: A | Discharge status: Home / Self Care | Payer: Medicare Other | Source: Ambulatory Visit | Attending: Pulmonary Disease | Admitting: Pulmonary Disease

## 2020-08-09 DIAGNOSIS — Z23 Encounter for immunization: Secondary | ICD-10-CM | POA: Insufficient documentation

## 2020-08-09 DIAGNOSIS — U071 COVID-19: Secondary | ICD-10-CM | POA: Diagnosis not present

## 2020-08-09 MED ORDER — FAMOTIDINE IN NACL 20-0.9 MG/50ML-% IV SOLN: 20.0000 mg | Freq: Once | INTRAVENOUS | Status: DC | PRN

## 2020-08-09 MED ORDER — EPINEPHRINE 0.3 MG/0.3ML IJ SOAJ: 0.3000 mg | Freq: Once | INTRAMUSCULAR | Status: DC | PRN

## 2020-08-09 MED ORDER — ALBUTEROL SULFATE HFA 108 (90 BASE) MCG/ACT IN AERS: 2.0000 | INHALATION_SPRAY | Freq: Once | RESPIRATORY_TRACT | Status: DC | PRN

## 2020-08-09 MED ORDER — DIPHENHYDRAMINE HCL 50 MG/ML IJ SOLN: 50.0000 mg | Freq: Once | INTRAMUSCULAR | Status: DC | PRN

## 2020-08-09 MED ORDER — METHYLPREDNISOLONE SODIUM SUCC 125 MG IJ SOLR: 125.0000 mg | Freq: Once | INTRAMUSCULAR | Status: DC | PRN

## 2020-08-09 MED ORDER — SODIUM CHLORIDE 0.9 % IV SOLN: INTRAVENOUS | Status: DC | PRN

## 2020-08-09 NOTE — Progress Notes (Signed)
  Diagnosis: COVID-19  Physician: Dr. Patrick Wright  Procedure: [facility] Med: casirivimab\imdevimab infusion - Provided patient with casirivimab\imdevimab fact sheet for patients, parents and caregivers prior to infusion.  Complications: No immediate complications noted.  Discharge: Discharged home   Nicholas Lewis S Nicholas Lewis 08/09/2020  

## 2020-08-09 NOTE — Discharge Instructions (Signed)
10 Things You Can Do to Manage Your COVID-19 Symptoms at Home If you have possible or confirmed COVID-19: 1. Stay home from work and school. And stay away from other public places. If you must go out, avoid using any kind of public transportation, ridesharing, or taxis. 2. Monitor your symptoms carefully. If your symptoms get worse, call your healthcare provider immediately. 3. Get rest and stay hydrated. 4. If you have a medical appointment, call the healthcare provider ahead of time and tell them that you have or may have COVID-19. 5. For medical emergencies, call 911 and notify the dispatch personnel that you have or may have COVID-19. 6. Cover your cough and sneezes with a tissue or use the inside of your elbow. 7. Wash your hands often with soap and water for at least 20 seconds or clean your hands with an alcohol-based hand sanitizer that contains at least 60% alcohol. 8. As much as possible, stay in a specific room and away from other people in your home. Also, you should use a separate bathroom, if available. If you need to be around other people in or outside of the home, wear a mask. 9. Avoid sharing personal items with other people in your household, like dishes, towels, and bedding. 10. Clean all surfaces that are touched often, like counters, tabletops, and doorknobs. Use household cleaning sprays or wipes according to the label instructions. cdc.gov/coronavirus 07/01/2019 This information is not intended to replace advice given to you by your health care provider. Make sure you discuss any questions you have with your health care provider. Document Revised: 12/03/2019 Document Reviewed: 12/03/2019 Elsevier Patient Education  2020 Else[employer] Inc.  What types of side effects do monoclonal antibody drugs cause?  Common side effects  In general, the more common side effects caused by monoclonal antibody drugs include: . Allergic reactions, such as hives or itching . Flu-like signs and  symptoms, including chills, fatigue, fever, and muscle aches and pains . Nausea, vomiting . Diarrhea . Skin rashes . Low blood pressure   The CDC is recommending patients who receive monoclonal antibody treatments wait at least 90 days before being vaccinated.  Currently, there are no data on the safety and efficacy of mRNA COVID-19 vaccines in persons who received monoclonal antibodies or convalescent plasma as part of COVID-19 treatment. Based on the estimated half-life of such therapies as well as evidence suggesting that reinfection is uncommon in the 90 days after initial infection, vaccination should be deferred for at least 90 days, as a precautionary measure until additional information becomes available, to avoid interference of the antibody treatment with vaccine-induced immune responses.  What types of side effects do monoclonal antibody drugs cause?  Common side effects  In general, the more common side effects caused by monoclonal antibody drugs include: . Allergic reactions, such as hives or itching . Flu-like signs and symptoms, including chills, fatigue, fever, and muscle aches and pains . Nausea, vomiting . Diarrhea . Skin rashes . Low blood pressure   The CDC is recommending patients who receive monoclonal antibody treatments wait at least 90 days before being vaccinated.  Currently, there are no data on the safety and efficacy of mRNA COVID-19 vaccines in persons who received monoclonal antibodies or convalescent plasma as part of COVID-19 treatment. Based on the estimated half-life of such therapies as well as evidence suggesting that reinfection is uncommon in the 90 days after initial infection, vaccination should be deferred for at least 90 days, as a precautionary measure until   additional information becomes available, to avoid interference of the antibody treatment with vaccine-induced immune responses. 

## 2020-08-12 DIAGNOSIS — R05 Cough: Secondary | ICD-10-CM | POA: Diagnosis not present

## 2020-08-12 DIAGNOSIS — J189 Pneumonia, unspecified organism: Secondary | ICD-10-CM | POA: Diagnosis not present

## 2020-08-12 DIAGNOSIS — U071 COVID-19: Secondary | ICD-10-CM | POA: Diagnosis not present

## 2020-08-12 DIAGNOSIS — R0609 Other forms of dyspnea: Secondary | ICD-10-CM | POA: Diagnosis not present

## 2020-09-07 DIAGNOSIS — H35033 Hypertensive retinopathy, bilateral: Secondary | ICD-10-CM | POA: Diagnosis not present

## 2020-09-07 DIAGNOSIS — H43811 Vitreous degeneration, right eye: Secondary | ICD-10-CM | POA: Diagnosis not present

## 2020-09-07 DIAGNOSIS — H25013 Cortical age-related cataract, bilateral: Secondary | ICD-10-CM | POA: Diagnosis not present

## 2020-09-07 DIAGNOSIS — H2513 Age-related nuclear cataract, bilateral: Secondary | ICD-10-CM | POA: Diagnosis not present

## 2020-09-08 ENCOUNTER — Ambulatory Visit (INDEPENDENT_AMBULATORY_CARE_PROVIDER_SITE_OTHER): Payer: Medicare Other | Admitting: Dermatology

## 2020-09-08 ENCOUNTER — Other Ambulatory Visit: Payer: Self-pay

## 2020-09-08 ENCOUNTER — Encounter: Payer: Self-pay | Admitting: Dermatology

## 2020-09-08 DIAGNOSIS — C4492 Squamous cell carcinoma of skin, unspecified: Secondary | ICD-10-CM

## 2020-09-08 DIAGNOSIS — D0422 Carcinoma in situ of skin of left ear and external auricular canal: Secondary | ICD-10-CM | POA: Diagnosis not present

## 2020-09-08 DIAGNOSIS — D099 Carcinoma in situ, unspecified: Secondary | ICD-10-CM

## 2020-09-08 DIAGNOSIS — L570 Actinic keratosis: Secondary | ICD-10-CM | POA: Diagnosis not present

## 2020-09-08 HISTORY — DX: Squamous cell carcinoma of skin, unspecified: C44.92

## 2020-09-08 NOTE — Patient Instructions (Signed)

## 2020-09-08 NOTE — Progress Notes (Signed)
  Left mid ear rim  Curet cautery 15fu  treatment size 1.5 cm   Left upper crus of antihelix Curet cautery47fu treatment size 1.3 cm  ln2 right side burn

## 2020-09-13 ENCOUNTER — Encounter: Payer: Self-pay | Admitting: *Deleted

## 2020-09-13 ENCOUNTER — Telehealth: Payer: Self-pay | Admitting: *Deleted

## 2020-09-13 NOTE — Telephone Encounter (Signed)
-----   Message from [name], MD sent at 09/13/2020  1:16 PM EDT ----- Schedule surgery with Dr. Darene Lamer

## 2020-09-13 NOTE — Telephone Encounter (Signed)
Pathology to patient, 30 minutes surgery scheduled.

## 2020-09-14 ENCOUNTER — Other Ambulatory Visit (HOSPITAL_COMMUNITY): Payer: Self-pay | Admitting: Internal Medicine

## 2020-09-14 DIAGNOSIS — R059 Cough, unspecified: Secondary | ICD-10-CM

## 2020-09-20 ENCOUNTER — Ambulatory Visit (HOSPITAL_COMMUNITY)
Admission: RE | Admit: 2020-09-20 | Discharge: 2020-09-20 | Disposition: A | Discharge status: Home / Self Care | Payer: Medicare Other | Source: Ambulatory Visit | Attending: Internal Medicine | Admitting: Internal Medicine

## 2020-09-20 ENCOUNTER — Other Ambulatory Visit: Payer: Self-pay

## 2020-09-20 DIAGNOSIS — R05 Cough: Secondary | ICD-10-CM | POA: Diagnosis not present

## 2020-09-20 DIAGNOSIS — R059 Cough, unspecified: Secondary | ICD-10-CM

## 2020-09-20 DIAGNOSIS — Z0120 Encounter for dental examination and cleaning without abnormal findings: Secondary | ICD-10-CM | POA: Diagnosis not present

## 2020-09-20 DIAGNOSIS — I517 Cardiomegaly: Secondary | ICD-10-CM | POA: Diagnosis not present

## 2020-09-20 DIAGNOSIS — J90 Pleural effusion, not elsewhere classified: Secondary | ICD-10-CM | POA: Diagnosis not present

## 2020-10-06 ENCOUNTER — Encounter: Payer: Self-pay | Admitting: Dermatology

## 2020-10-07 NOTE — Progress Notes (Signed)
   Follow-Up Visit   Subjective  Nicholas Lewis is a 79 y.o. male who presents for the following: Procedure (CIS x 2 Left Mid Ear Rim and Left Upper Crus of Antihelix).  Biopsy-proven CIS x2 left ear Location:  Duration:  Quality:  Associated Signs/Symptoms: Modifying Factors:  Severity:  Timing: Context: For treatment  Objective  Well appearing patient in no apparent distress; mood and affect are within normal limits.  A focused examination was performed including Head and neck.. Relevant physical exam findings are noted in the Assessment and Plan.   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  Destruction of lesion Complexity: simple   Destruction method: electrodesiccation and curettage   Informed consent: discussed and consent obtained   Timeout:  patient name, date of birth, surgical site, and procedure verified Anesthesia: the lesion was anesthetized in a standard fashion   Anesthetic:  1% lidocaine w/ epinephrine 1-100,000 local infiltration Curettage performed in three different directions: Yes   Curettage cycles:  3 Lesion length (cm):  1.4 Lesion width (cm):  1 Margin per side (cm):  0 Final wound size (cm):  1.4 Hemostasis achieved with:  ferric subsulfate Outcome: patient tolerated procedure well with no complications   Post-procedure details: wound care instructions given   Additional details:  Inoculated with parenteral 5% fluorouracil  Specimen 1 - Surgical pathology Differential Diagnosis:bcc scc Check Margins: No Curet cautery 32fu treatment size 1.0   Squamous cell carcinoma in situ Left Cymba  Destruction of lesion Complexity: simple   Destruction method: electrodesiccation and curettage   Informed consent: discussed and consent obtained   Timeout:  patient name, date of birth, surgical site, and procedure verified Anesthesia: the lesion was anesthetized in a standard fashion     Anesthetic:  1% lidocaine w/ epinephrine 1-100,000 local infiltration Curettage performed in three different directions: Yes   Curettage cycles:  3 Lesion length (cm):  1.2 Lesion width (cm):  1 Margin per side (cm):  0 Final wound size (cm):  1.2 Hemostasis achieved with:  ferric subsulfate Outcome: patient tolerated procedure well with no complications   Post-procedure details: wound care instructions given   Additional details:  Inoculated with parenteral 5% fluorouracil  Destruction of lesion  AK (actinic keratosis) Right Temporal Scalp  Destruction of lesion - Right Temporal Scalp Complexity: simple   Destruction method: cryotherapy   Informed consent: discussed and consent obtained   Timeout:  patient name, date of birth, surgical site, and procedure verified Lesion destroyed using liquid nitrogen: Yes   Region frozen until ice ball extended beyond lesion: Yes   Cryotherapy cycles:  5 Outcome: patient tolerated procedure well with no complications    The biopsy site of the upper helix is clinically clear and will be watched.  There was a second typical carcinoma in situ of the upper antihelix on the left ear which was biopsied and then treated with curettage plus 5-FU.  Additionally Mr. Nicholas Lewis pointed out a symptomatic actinic keratosis on the right sideburn which was treated with cryosurgery.   I, [name], MD, have reviewed all documentation for this visit.  The documentation on 10/07/20 for the exam, diagnosis, procedures, and orders are all accurate and complete.

## 2020-10-20 ENCOUNTER — Other Ambulatory Visit: Payer: Self-pay

## 2020-10-20 ENCOUNTER — Ambulatory Visit (INDEPENDENT_AMBULATORY_CARE_PROVIDER_SITE_OTHER): Payer: Medicare Other | Admitting: Dermatology

## 2020-10-20 ENCOUNTER — Encounter: Payer: Self-pay | Admitting: Dermatology

## 2020-10-20 DIAGNOSIS — L570 Actinic keratosis: Secondary | ICD-10-CM | POA: Diagnosis not present

## 2020-10-20 DIAGNOSIS — D0422 Carcinoma in situ of skin of left ear and external auricular canal: Secondary | ICD-10-CM

## 2020-10-20 DIAGNOSIS — D099 Carcinoma in situ, unspecified: Secondary | ICD-10-CM

## 2020-10-20 NOTE — Progress Notes (Signed)
Upper left mid crus helix clear [phone]

## 2020-11-08 ENCOUNTER — Ambulatory Visit: Payer: Medicare Other | Admitting: Dermatology

## 2020-11-11 DIAGNOSIS — U071 COVID-19: Secondary | ICD-10-CM | POA: Diagnosis not present

## 2020-11-30 ENCOUNTER — Emergency Department (HOSPITAL_COMMUNITY): Payer: Medicare Other

## 2020-11-30 ENCOUNTER — Emergency Department (HOSPITAL_COMMUNITY)
Admission: EM | Admit: 2020-11-30 | Discharge: 2020-11-30 | Disposition: A | Discharge status: Home / Self Care | Payer: Medicare Other | Attending: Emergency Medicine | Admitting: Emergency Medicine

## 2020-11-30 ENCOUNTER — Encounter (HOSPITAL_COMMUNITY): Payer: Self-pay | Admitting: Emergency Medicine

## 2020-11-30 ENCOUNTER — Other Ambulatory Visit: Payer: Self-pay

## 2020-11-30 DIAGNOSIS — Z85828 Personal history of other malignant neoplasm of skin: Secondary | ICD-10-CM | POA: Diagnosis not present

## 2020-11-30 DIAGNOSIS — Z79899 Other long term (current) drug therapy: Secondary | ICD-10-CM | POA: Diagnosis not present

## 2020-11-30 DIAGNOSIS — W01198A Fall on same level from slipping, tripping and stumbling with subsequent striking against other object, initial encounter: Secondary | ICD-10-CM | POA: Insufficient documentation

## 2020-11-30 DIAGNOSIS — S43401A Unspecified sprain of right shoulder joint, initial encounter: Secondary | ICD-10-CM | POA: Diagnosis not present

## 2020-11-30 DIAGNOSIS — Z85850 Personal history of malignant neoplasm of thyroid: Secondary | ICD-10-CM | POA: Diagnosis not present

## 2020-11-30 DIAGNOSIS — Z7982 Long term (current) use of aspirin: Secondary | ICD-10-CM | POA: Insufficient documentation

## 2020-11-30 DIAGNOSIS — S4991XA Unspecified injury of right shoulder and upper arm, initial encounter: Secondary | ICD-10-CM | POA: Diagnosis present

## 2020-11-30 DIAGNOSIS — M25511 Pain in right shoulder: Secondary | ICD-10-CM | POA: Diagnosis not present

## 2020-11-30 DIAGNOSIS — Z96653 Presence of artificial knee joint, bilateral: Secondary | ICD-10-CM | POA: Diagnosis not present

## 2020-11-30 DIAGNOSIS — Z86010 Personal history of colonic polyps: Secondary | ICD-10-CM | POA: Diagnosis not present

## 2020-11-30 DIAGNOSIS — I10 Essential (primary) hypertension: Secondary | ICD-10-CM | POA: Insufficient documentation

## 2020-11-30 MED ORDER — CELECOXIB 200 MG PO CAPS
200.0000 mg | ORAL_CAPSULE | Freq: Two times a day (BID) | ORAL | 0 refills | Status: DC
Start: 2020-11-30 — End: 2021-03-10

## 2020-11-30 NOTE — ED Notes (Signed)
ED Provider at bedside. 

## 2020-11-30 NOTE — ED Notes (Signed)
Entered room and introduced self to patient. Pt appears to be resting in bed with no obvious signs of distress noted. Bed is locked in the lowest position, side rails x1, call bell within reach. All questions and concerns voiced addressed at this time. Respirations are even and unlabored with equal chest rise and fall.

## 2020-11-30 NOTE — ED Notes (Signed)
Discharge paperwork provided to patient. All questions and concerns voiced addressed at the time of discharge.  Pt unable to sign due to orthopedic device, verbalized understanding using teach back method.

## 2020-11-30 NOTE — ED Triage Notes (Signed)
Pt fell and " jammed" his right arm. Denies LOC, Hitting head, or blood thinners.

## 2020-11-30 NOTE — Discharge Instructions (Signed)
Contact a health care provider if: °Your pain gets worse. °Your pain is not relieved with medicines. °New pain develops in your arm, hand, or fingers. °Get help right away if: °Your arm, hand, or fingers: °Tingle. °Become numb. °Become swollen. °Become painful. °Turn white or blue. °

## 2020-11-30 NOTE — ED Provider Notes (Signed)
[facility] EMERGENCY DEPARTMENT Provider Note   CSN: [id number] Arrival date & time: 11/30/20  1101     History Chief Complaint  Patient presents with  . Arm Pain    Nicholas Lewis is a 79 y.o. male.  The history is provided by the patient. No language interpreter was used.  Arm Pain This is a new problem. The current episode started yesterday. The problem occurs constantly. The problem has not changed since onset.Pertinent negatives include no chest pain, no abdominal pain, no headaches and no shortness of breath. Exacerbated by: any movement of the shoulder. Relieved by: bracing the arm. The treatment provided no relief.   79 year old right-hand-dominant male presents emergency department chief complaint of right shoulder injury.  Patient states that he [place] working [place] when he tripped on a root, fell directly onto his right elbow.  Since that time he has had severe pain in his right shoulder.  He has been bracing the arm.  Pain is less when he holds the arm still worse with any movement of the shoulder joint.  He denies numbness or tingling.  He did not hit his head or lose consciousness.     Past Medical History:  Diagnosis Date  . Abrasion    l arm  . Arthritis   . Atypical nevus 10/11/1997   medial left midcalf-mild (Dr. [place] Crane )  . Atypical nevus 11/24/2002   right back upper- marked (exc)  . Cellulitis 07/2012   after surgery for left knee  . Complication of anesthesia    slow to wake up  . Dog scratch 01/14/2015    office visit note from PCP on chart( to lower lip)   . Gout   . Hypertension   . Pneumonia    hx of 2015   . SCC (squamous cell carcinoma) 12/17/2012   forehead (CX35FU)  . SCC (squamous cell carcinoma) 03/19/2018   right sideburn,sup (CX3FU)   . SCC (squamous cell carcinoma) 03/19/2018   above right eyebrow (CX35FU)  . SCC (squamous cell carcinoma) x 2 05/31/2014   right sideburn (Cx35FU),right shoulder (CX35FU)  . SCCA (squamous cell  carcinoma) of skin 07/05/2020   Left Mid Ear Rim (in situ)  . SCCA (squamous cell carcinoma) of skin 07/05/2020   Left Upper Crus of Antihelix (in situ)  . scca in situ 11/21/2004   left sideburn (CX35FU)  . Squamous cell carcinoma of skin 09/08/2020   in situ- upper left mid crus helix ear(CX35FU)    Patient Active Problem List   Diagnosis Date Noted  . Spondylolisthesis of lumbar region 06/23/2019  . Lumbar pain 06/30/2018  . Degenerative spondylolisthesis 06/30/2018  . Spinal stenosis of lumbar region 06/30/2018  . Lumbar radiculopathy 05/27/2018  . Family history of colon cancer 12/27/2015  . History of adenomatous polyp of colon 12/27/2015  . Neoplasm of uncertain behavior of thyroid gland 01/20/2015  . Stiffness of joint, lower leg 10/15/2014  . Difficulty walking 10/15/2014  . Weakness of right leg 10/15/2014  . OA (osteoarthritis) of knee 09/20/2014  . Knee stiffness 09/09/2012  . S/P left TKA 08/09/2012    Past Surgical History:  Procedure Laterality Date  . BACK SURGERY    . COLONOSCOPY  11/10/2010   RMR: internal hemorrhoids , anal papilla, otherwise normal rectum.  Long tortuous colon , polyps  in the cecum and sigmoid segment status post removeal as described above.   . COLONOSCOPY N/A 01/05/2016   Dr.Rourk- normal colonoscopy (redundant colon)   .  CYSTECTOMY  2003   from spine  . JOINT REPLACEMENT    . KNEE ARTHROSCOPY  2000   left  . THYROID LOBECTOMY Left 01/20/2015   Procedure: LEFT THYROID LOBECTOMY;  Surgeon: Armandina Gemma, MD;  Location: [facility];  Service: General;  Laterality: Left;  . TOTAL KNEE ARTHROPLASTY  08/06/2012   Procedure: TOTAL KNEE ARTHROPLASTY;  Surgeon: Magnus Sinning, MD;  Location: [facility];  Service: Orthopedics;  Laterality: Left;  . TOTAL KNEE ARTHROPLASTY Right 09/20/2014   Procedure: RIGHT TOTAL KNEE ARTHROPLASTY;  Surgeon: Gearlean Alf, MD;  Location: [facility];  Service: Orthopedics;  Laterality: Right;       Family History    Problem Relation Age of Onset  . Colon cancer Father 93    Social History   Tobacco Use  . Smoking status: Never Smoker  . Smokeless tobacco: Never Used  Vaping Use  . Vaping Use: Never used  Substance Use Topics  . Alcohol use: No  . Drug use: No    Home Medications Prior to Admission medications   Medication Sig Start Date End Date Taking? Authorizing Provider  acetaminophen (TYLENOL) 325 MG tablet Take 650 mg by mouth every 6 (six) hours as needed.    [provider]  aspirin EC 81 MG tablet Take 81 mg by mouth daily.     [provider]  levothyroxine (SYNTHROID, LEVOTHROID) 112 MCG tablet Take 112 mcg by mouth daily before breakfast.  01/18/17   [provider]  losartan (COZAAR) 50 MG tablet Take 100 mg by mouth daily.  03/21/19   [provider]  metoprolol tartrate (LOPRESSOR) 25 MG tablet Take 25 mg by mouth 2 (two) times daily.  01/17/17   [provider]  Multiple Vitamin (MULTIVITAMIN WITH MINERALS) TABS tablet Take 1 tablet by mouth daily.    [provider]  vitamin C (ASCORBIC ACID) 500 MG tablet Take 500 mg by mouth daily.    [provider]    Allergies    Sulfa antibiotics, Mobic [meloxicam], and Percocet [oxycodone-acetaminophen]  Review of Systems   Review of Systems  Respiratory: Negative for shortness of breath.   Cardiovascular: Negative for chest pain.  Gastrointestinal: Negative for abdominal pain.  Neurological: Negative for headaches.    Physical Exam Updated Vital Signs BP (!) 171/90 (BP Location: Left Arm)   Pulse 62   Temp 98.3 F (36.8 C) (Oral)   Resp 18   Ht 5\' 11"  (1.803 m)   Wt 101.6 kg   SpO2 96%   BMI 31.24 kg/m   Physical Exam Vitals and nursing note reviewed.  Constitutional:      General: He is not in acute distress.    Appearance: He is well-developed. He is not diaphoretic.  HENT:     Head: Normocephalic and atraumatic.  Eyes:     General: No scleral  icterus.    Conjunctiva/sclera: Conjunctivae normal.  Cardiovascular:     Rate and Rhythm: Normal rate and regular rhythm.     Heart sounds: Normal heart sounds.  Pulmonary:     Effort: Pulmonary effort is normal. No respiratory distress.     Breath sounds: Normal breath sounds.  Abdominal:     Palpations: Abdomen is soft.     Tenderness: There is no abdominal tenderness.  Musculoskeletal:     Cervical back: Normal range of motion and neck supple.     Comments: Exquisitely tender over the anterior right shoulder.  No tenderness at the [facility]  joint.  Patient unable to mobilize the shoulder past about 20 degrees of abduction.  He has some pain with pronation at the elbow but no pain with extension or flexion, normal wrist and hand examination.  No obvious swelling or deformity.  Skin:    General: Skin is warm and dry.  Neurological:     Mental Status: He is alert.  Psychiatric:        Behavior: Behavior normal.     ED Results / Procedures / Treatments   Labs (all labs ordered are listed, but only abnormal results are displayed) Labs Reviewed - No data to display  EKG None  Radiology DG Shoulder Right  Result Date: 11/30/2020 CLINICAL DATA:  Pain following fall EXAM: RIGHT SHOULDER - 2+ VIEW COMPARISON:  None. FINDINGS: Oblique and Y scapular images were obtained. No fracture or dislocation. There is mild generalized joint space narrowing. No erosion. Mild bony overgrowth is noted along the inferior acromioclavicular joint. No intra-articular calcification. Visualized right lung clear. IMPRESSION: Mild generalized osteoarthritic change. Relative bony overgrowth along the inferior acromioclavicular joint potentially places patient at increased risk for impingement syndrome. No fracture or dislocation. No erosion. Electronically Signed   By: Lowella Grip III M.D.   On: 11/30/2020 11:49    Procedures Procedures (including critical care time)  Medications Ordered in ED Medications  - No data to display  ED Course  I have reviewed the triage vital signs and the nursing notes.  Pertinent labs & imaging results that were available during my care of the patient were reviewed by me and considered in my medical decision making (see chart for details).    MDM Rules/Calculators/A&P                          Patient here with complaint of right shoulder injury.  I personally ordered and reviewed the patient's shoulder x-ray which shows significant arthritic changes and spurring no obvious fracture or dislocation.  Patient was placed in a sling immobilizer for comfort.  We reviewed shoulder mobilization techniques to avoid frozen shoulder and he is advised to do this several times a day.  Will discharge with Celebrex, ice.  He has an established relationship with Dr. Wynelle Link at [facility] and I have very referred him back to their practice.  He appears otherwise appropriate for discharge at this time. Final Clinical Impression(s) / ED Diagnoses Final diagnoses:  None    Rx / DC Orders ED Discharge Orders    None       Margarita Mail, PA-C 11/30/20 1337    Milton Ferguson, MD 12/01/20 1110

## 2020-12-07 DIAGNOSIS — M25511 Pain in right shoulder: Secondary | ICD-10-CM | POA: Diagnosis not present

## 2020-12-09 DIAGNOSIS — I10 Essential (primary) hypertension: Secondary | ICD-10-CM | POA: Diagnosis not present

## 2020-12-09 DIAGNOSIS — Z6832 Body mass index (BMI) 32.0-32.9, adult: Secondary | ICD-10-CM | POA: Diagnosis not present

## 2020-12-09 DIAGNOSIS — M48061 Spinal stenosis, lumbar region without neurogenic claudication: Secondary | ICD-10-CM | POA: Diagnosis not present

## 2021-01-04 DIAGNOSIS — M25511 Pain in right shoulder: Secondary | ICD-10-CM | POA: Diagnosis not present

## 2021-01-18 ENCOUNTER — Encounter: Payer: Self-pay | Admitting: Dermatology

## 2021-01-18 NOTE — Progress Notes (Signed)
   Follow-Up Visit   Subjective  Nicholas Lewis is a 80 y.o. male who presents for the following: Procedure (here for treatment- upper left mid crus helix ear- scc insitu x 1 ).  Yes Location: Ear Duration:  Quality:  Associated Signs/Symptoms: Modifying Factors:  Severity:  Timing: Context: Also crust on neck.  Objective  Well appearing patient in no apparent distress; mood and affect are within normal limits. Objective  Neck - Anterior: 5 mm pink hornlike crust  Objective  Left Scaphoid Fossa: Biopsy site identified by nurse and me.  Spot is perfectly smooth with no sign of residual after biopsy.    A focused examination was performed including Head and neck.. Relevant physical exam findings are noted in the Assessment and Plan.   Assessment & Plan    AK (actinic keratosis) Neck - Anterior  Destruction of lesion - Neck - Anterior Complexity: simple   Destruction method: cryotherapy   Informed consent: discussed and consent obtained   Lesion destroyed using liquid nitrogen: Yes   Cryotherapy cycles:  5 Outcome: patient tolerated procedure well with no complications    Squamous cell carcinoma in situ Left Scaphoid Fossa  Patient will return if there is recurrent crusting or bleeding, otherwise recheck 1 year      I, [name], MD, have reviewed all documentation for this visit.  The documentation on 01/18/21 for the exam, diagnosis, procedures, and orders are all accurate and complete.

## 2021-02-01 ENCOUNTER — Encounter: Payer: Self-pay | Admitting: Internal Medicine

## 2021-02-01 DIAGNOSIS — M25511 Pain in right shoulder: Secondary | ICD-10-CM | POA: Diagnosis not present

## 2021-02-14 DIAGNOSIS — Z0000 Encounter for general adult medical examination without abnormal findings: Secondary | ICD-10-CM | POA: Diagnosis not present

## 2021-02-14 DIAGNOSIS — D6949 Other primary thrombocytopenia: Secondary | ICD-10-CM | POA: Diagnosis not present

## 2021-02-14 DIAGNOSIS — Z6835 Body mass index (BMI) 35.0-35.9, adult: Secondary | ICD-10-CM | POA: Diagnosis not present

## 2021-02-14 DIAGNOSIS — Z1389 Encounter for screening for other disorder: Secondary | ICD-10-CM | POA: Diagnosis not present

## 2021-02-14 DIAGNOSIS — I10 Essential (primary) hypertension: Secondary | ICD-10-CM | POA: Diagnosis not present

## 2021-02-14 DIAGNOSIS — R001 Bradycardia, unspecified: Secondary | ICD-10-CM | POA: Diagnosis not present

## 2021-02-14 DIAGNOSIS — E890 Postprocedural hypothyroidism: Secondary | ICD-10-CM | POA: Diagnosis not present

## 2021-02-21 ENCOUNTER — Other Ambulatory Visit: Payer: Self-pay

## 2021-02-21 ENCOUNTER — Encounter: Payer: Self-pay | Admitting: Dermatology

## 2021-02-21 ENCOUNTER — Ambulatory Visit: Payer: Medicare Other | Admitting: Physician Assistant

## 2021-02-21 ENCOUNTER — Ambulatory Visit: Payer: Medicare Other | Admitting: Dermatology

## 2021-02-21 DIAGNOSIS — L739 Follicular disorder, unspecified: Secondary | ICD-10-CM | POA: Diagnosis not present

## 2021-02-21 DIAGNOSIS — R202 Paresthesia of skin: Secondary | ICD-10-CM

## 2021-02-21 DIAGNOSIS — D0422 Carcinoma in situ of skin of left ear and external auricular canal: Secondary | ICD-10-CM

## 2021-02-21 DIAGNOSIS — D485 Neoplasm of uncertain behavior of skin: Secondary | ICD-10-CM | POA: Diagnosis not present

## 2021-02-21 DIAGNOSIS — D0439 Carcinoma in situ of skin of other parts of face: Secondary | ICD-10-CM

## 2021-02-21 DIAGNOSIS — Z1283 Encounter for screening for malignant neoplasm of skin: Secondary | ICD-10-CM

## 2021-02-21 NOTE — Patient Instructions (Signed)

## 2021-02-23 ENCOUNTER — Encounter: Payer: Self-pay | Admitting: Dermatology

## 2021-02-23 NOTE — Progress Notes (Signed)
   Follow-Up Visit   Subjective  Nicholas Lewis is a 80 y.o. male who presents for the following: Skin Problem (Place on back that itches x 1 month-no bleeding-no treatment).   The following portions of the chart were reviewed this encounter and updated as appropriate:      Objective  Well appearing patient in no apparent distress; mood and affect are within normal limits.  All skin waist up examined.  Right Forehead     Right Preauricular Area     Left Superior Crus of Antihelix     Objective  waist up: No atypical nevi    Assessment & Plan  Neoplasm of uncertain behavior of skin (3) Right Forehead  Skin / nail biopsy Type of biopsy: tangential   Informed consent: discussed and consent obtained   Timeout: patient name, date of birth, surgical site, and procedure verified   Anesthesia: the lesion was anesthetized in a standard fashion   Anesthetic:  1% lidocaine w/ epinephrine 1-100,000 local infiltration Instrument used: flexible razor blade   Hemostasis achieved with: aluminum chloride and electrodesiccation   Outcome: patient tolerated procedure well   Post-procedure details: wound care instructions given    Specimen 1 - Surgical pathology Differential Diagnosis: bcc scc  Check Margins: No  Right Preauricular Area  Skin / nail biopsy Type of biopsy: tangential   Informed consent: discussed and consent obtained   Timeout: patient name, date of birth, surgical site, and procedure verified   Anesthesia: the lesion was anesthetized in a standard fashion   Anesthetic:  1% lidocaine w/ epinephrine 1-100,000 local infiltration Instrument used: flexible razor blade   Hemostasis achieved with: aluminum chloride and electrodesiccation   Outcome: patient tolerated procedure well   Post-procedure details: wound care instructions given    Specimen 2 - Surgical pathology Differential Diagnosis: bcc scc  Check Margins: No  Left Superior Crus of  Antihelix  Skin / nail biopsy Type of biopsy: tangential   Informed consent: discussed and consent obtained   Timeout: patient name, date of birth, surgical site, and procedure verified   Anesthesia: the lesion was anesthetized in a standard fashion   Anesthetic:  1% lidocaine w/ epinephrine 1-100,000 local infiltration Instrument used: flexible razor blade   Hemostasis achieved with: aluminum chloride and electrodesiccation   Outcome: patient tolerated procedure well   Post-procedure details: wound care instructions given    Specimen 3 - Surgical pathology Differential Diagnosis:bcc scc  Check Margins: No  Screening exam for skin cancer waist up  Yearly skin exams    I, Raheel Kunkle, PA-C, have reviewed all documentation's for this visit.  The documentation on 02/23/21 for the exam, diagnosis, procedures and orders are all accurate and complete.

## 2021-02-27 ENCOUNTER — Telehealth: Payer: Self-pay | Admitting: *Deleted

## 2021-02-27 NOTE — Telephone Encounter (Signed)
-----   Message from Warren Danes, [place] sent at 02/23/2021  5:15 PM EST ----- 2 and 3 30 min... add on at end of a day.

## 2021-02-27 NOTE — Telephone Encounter (Signed)
Patient is returning our telephone call.

## 2021-02-27 NOTE — Telephone Encounter (Signed)
Left message for patient to return our call.

## 2021-03-03 NOTE — Progress Notes (Addendum)
I, [name], MD, have reviewed all documentation for this visit. The documentation on 03/03/21 for the exam, diagnosis, procedures, and orders are all accurate and complete.  Follow-Up Visit   Subjective  Nicholas Lewis is a 80 y.o. male who presents for the following: Skin Problem (Place on back that itches x 1 month-no bleeding-no treatment).  Several new growths on left ear and face. Location:  Duration:  Quality:  Associated Signs/Symptoms: Modifying Factors:  Severity:  Timing: Context: History of skin cancer.  Objective  Well appearing patient in no apparent distress; mood and affect are within normal limits. Objective  Right Forehead: Pearly 5 mm papule; early BCC versus inflamed sebaceous hyperplasia.     Objective  Right Preauricular Area: Hornlike 6 mm pink crust, early SCCA     Objective  Left Superior Crus of Antihelix: Flat pink-brown focally ulcerated crust, dermoscopy does not show melanocytic lesion.  Pigmented Bowen's.     Objective  waist up: No atypical nevi or recurrence of nonmelanoma skin cancer.  Objective  Right Upper Back: Clear with her itching on back is from a slightly irritated keratosis or notalgia.   All skin waist up examined.   Assessment & Plan    Neoplasm of uncertain behavior of skin (3) Right Forehead  Skin / nail biopsy Type of biopsy: tangential   Informed consent: discussed and consent obtained   Timeout: patient name, date of birth, surgical site, and procedure verified   Anesthesia: the lesion was anesthetized in a standard fashion   Anesthetic:  1% lidocaine w/ epinephrine 1-100,000 local infiltration Instrument used: flexible razor blade   Hemostasis achieved with: aluminum chloride and electrodesiccation   Outcome: patient tolerated procedure well   Post-procedure details: wound care instructions given    Specimen 1 - Surgical pathology Differential Diagnosis: bcc scc  Check Margins:  No  Right Preauricular Area  Skin / nail biopsy Type of biopsy: tangential   Informed consent: discussed and consent obtained   Timeout: patient name, date of birth, surgical site, and procedure verified   Anesthesia: the lesion was anesthetized in a standard fashion   Anesthetic:  1% lidocaine w/ epinephrine 1-100,000 local infiltration Instrument used: flexible razor blade   Hemostasis achieved with: aluminum chloride and electrodesiccation   Outcome: patient tolerated procedure well   Post-procedure details: wound care instructions given    Specimen 2 - Surgical pathology Differential Diagnosis: bcc scc  Check Margins: No  Left Superior Crus of Antihelix  Skin / nail biopsy Type of biopsy: tangential   Informed consent: discussed and consent obtained   Timeout: patient name, date of birth, surgical site, and procedure verified   Anesthesia: the lesion was anesthetized in a standard fashion   Anesthetic:  1% lidocaine w/ epinephrine 1-100,000 local infiltration Instrument used: flexible razor blade   Hemostasis achieved with: aluminum chloride and electrodesiccation   Outcome: patient tolerated procedure well   Post-procedure details: wound care instructions given    Specimen 3 - Surgical pathology Differential Diagnosis:bcc scc  Check Margins: No  Screening exam for skin cancer waist up  Yearly skin exams  Notalgia paresthetica Right Upper Back  Lesion clinically benign and symptoms not severe so no intervention for now.     I, [name], MD, have reviewed all documentation for this visit.  The documentation on 03/03/21 for the exam, diagnosis, procedures, and orders are all accurate and complete.I, [name], MD, have reviewed all documentation for this visit. The documentation on 03/03/21 for the  exam, diagnosis, procedures, and orders are all accurate and complete.

## 2021-03-10 ENCOUNTER — Encounter: Payer: Self-pay | Admitting: Internal Medicine

## 2021-03-10 ENCOUNTER — Encounter: Payer: Self-pay | Admitting: *Deleted

## 2021-03-10 ENCOUNTER — Other Ambulatory Visit: Payer: Self-pay

## 2021-03-10 ENCOUNTER — Other Ambulatory Visit: Payer: Self-pay | Admitting: *Deleted

## 2021-03-10 ENCOUNTER — Ambulatory Visit: Payer: Medicare Other | Admitting: Internal Medicine

## 2021-03-10 VITALS — BP 160/70 | HR 57 | Temp 97.5°F | Ht 69.0 in | Wt 237.6 lb

## 2021-03-10 DIAGNOSIS — Z860100 Personal history of colon polyps, unspecified: Secondary | ICD-10-CM

## 2021-03-10 DIAGNOSIS — Z800 Family history of malignant neoplasm of digestive organs: Secondary | ICD-10-CM

## 2021-03-10 MED ORDER — PEG 3350-KCL-NA BICARB-NACL 420 G PO SOLR: ORAL | 0 refills | Status: DC

## 2021-03-10 NOTE — Patient Instructions (Signed)
We will schedule a high risk screening  Colonoscopy (family history of colon cancer)  ASA 2/ propofol  Further recommendations to follow

## 2021-03-10 NOTE — Progress Notes (Signed)
Primary Care Physician:  Sharilyn Sites, MD Primary Gastroenterologist:  Dr. Gala Romney  Pre-Procedure History & Physical: HPI:  Nicholas Lewis is a 80 y.o. male here for consideration of a high risk screening colonoscopy.  Since I last saw him 5 years ago his sister was diagnosed with colon cancer at age 36.  Father previously diagnosed with colon cancer.  Adenoma removed in the distant past negative colonoscopy (redundant and elongated) 5 years ago.  Patient remains a very active cattle farmer and is in relatively good health.  He is desirous of another colonoscopy for screening purposes at this point in time.  He is not had any rectal bleeding or change in bowel habits no abdominal pain  Past Medical History:  Diagnosis Date  . Abrasion    l arm  . Arthritis   . Atypical nevus 10/11/1997   medial left midcalf-mild (Dr. Nevada Crane )  . Atypical nevus 11/24/2002   right back upper- marked (exc)  . Cellulitis 07/2012   after surgery for left knee  . Complication of anesthesia    slow to wake up  . Dog scratch 01/14/2015    office visit note from PCP on chart( to lower lip)   . Gout   . Hypertension   . Pneumonia    hx of 2015   . SCC (squamous cell carcinoma) 12/17/2012   forehead (CX35FU)  . SCC (squamous cell carcinoma) 03/19/2018   right sideburn,sup (CX3FU)   . SCC (squamous cell carcinoma) 03/19/2018   above right eyebrow (CX35FU)  . SCC (squamous cell carcinoma) x 2 05/31/2014   right sideburn (Cx35FU),right shoulder (CX35FU)  . SCCA (squamous cell carcinoma) of skin 07/05/2020   Left Mid Ear Rim (in situ)  . SCCA (squamous cell carcinoma) of skin 07/05/2020   Left Upper Crus of Antihelix (in situ)  . scca in situ 11/21/2004   left sideburn (CX35FU)  . Squamous cell carcinoma of skin 09/08/2020   in situ- upper left mid crus helix ear(CX35FU)    Past Surgical History:  Procedure Laterality Date  . BACK SURGERY    . COLONOSCOPY  11/10/2010   RMR: internal hemorrhoids ,  anal papilla, otherwise normal rectum.  Long tortuous colon , polyps  in the cecum and sigmoid segment status post removeal as described above.   . COLONOSCOPY N/A 01/05/2016   Dr.Nichollas Perusse- normal colonoscopy (redundant colon)   . CYSTECTOMY  2003   from spine  . JOINT REPLACEMENT    . KNEE ARTHROSCOPY  2000   left  . THYROID LOBECTOMY Left 01/20/2015   Procedure: LEFT THYROID LOBECTOMY;  Surgeon: Armandina Gemma, MD;  Location: [facility];  Service: General;  Laterality: Left;  . TOTAL KNEE ARTHROPLASTY  08/06/2012   Procedure: TOTAL KNEE ARTHROPLASTY;  Surgeon: Magnus Sinning, MD;  Location: [facility];  Service: Orthopedics;  Laterality: Left;  . TOTAL KNEE ARTHROPLASTY Right 09/20/2014   Procedure: RIGHT TOTAL KNEE ARTHROPLASTY;  Surgeon: Gearlean Alf, MD;  Location: [facility];  Service: Orthopedics;  Laterality: Right;    Prior to Admission medications   Medication Sig Start Date End Date Taking? Authorizing Provider  acetaminophen (TYLENOL) 325 MG tablet Take 650 mg by mouth every 6 (six) hours as needed.   Yes [provider]  aspirin EC 81 MG tablet Take 81 mg by mouth daily.    Yes [provider]  cholecalciferol (VITAMIN D3) 25 MCG (1000 UNIT) tablet Take 1,000 Units by mouth daily.   Yes [provider]  ELDERBERRY PO Take 2 tablets by mouth daily.   Yes [provider]  levothyroxine (SYNTHROID, LEVOTHROID) 112 MCG tablet Take 112 mcg by mouth daily before breakfast.  01/18/17  Yes [provider]  losartan (COZAAR) 50 MG tablet Take 100 mg by mouth daily.  03/21/19  Yes [provider]  metoprolol tartrate (LOPRESSOR) 25 MG tablet Take 25 mg by mouth 2 (two) times daily.  01/17/17  Yes [provider]  Multiple Vitamin (MULTIVITAMIN WITH MINERALS) TABS tablet Take 1 tablet by mouth daily.   Yes [provider]  vitamin C (ASCORBIC ACID) 500 MG tablet Take 500 mg by mouth daily.   Yes [provider]  zinc gluconate  50 MG tablet Take 50 mg by mouth daily.   Yes [provider]    Allergies as of 03/10/2021 - Review Complete 03/10/2021  Allergen Reaction Noted  . Sulfa antibiotics Other (See Comments) 07/22/2012  . Mobic [meloxicam] Rash 07/23/2012  . Percocet [oxycodone-acetaminophen] Other (See Comments) 09/20/2014    Family History  Problem Relation Age of Onset  . Colon cancer Father 94    Social History   Socioeconomic History  . Marital status: Married    Spouse name: Not on file  . Number of children: Not on file  . Years of education: Not on file  . Highest education level: Not on file  Occupational History  . Not on file  Tobacco Use  . Smoking status: Never Smoker  . Smokeless tobacco: Never Used  Vaping Use  . Vaping Use: Never used  Substance and Sexual Activity  . Alcohol use: No  . Drug use: No  . Sexual activity: Not on file  Other Topics Concern  . Not on file  Social History Narrative  . Not on file   Social Determinants of Health   Financial Resource Strain: Not on file  Food Insecurity: Not on file  Transportation Needs: Not on file  Physical Activity: Not on file  Stress: Not on file  Social Connections: Not on file  Intimate Partner Violence: Not on file    Review of Systems: See HPI, otherwise negative ROS  Physical Exam: BP (!) 160/70   Pulse (!) 57   Temp (!) 97.5 F (36.4 C) (Temporal)   Ht 5\' 9"  (1.753 m)   Wt 237 lb 9.6 oz (107.8 kg)   BMI 35.09 kg/m  General:   Alert,  pleasant and cooperative in NAD Neck:  Supple; no masses or thyromegaly. No significant cervical adenopathy. Lungs:  Clear throughout to auscultation.   No wheezes, crackles, or rhonchi. No acute distress. Heart:  Regular rate and rhythm; no murmurs, clicks, rubs,  or gallops. Abdomen: Non-distended, normal bowel sounds.  Soft and nontender without appreciable mass or hepatosplenomegaly.  Pulses:  Normal pulses noted. Extremities:  Without clubbing or  edema.  Impression/Plan: 80 year old gentleman with a positive family history of colon cancer in 2 first-degree relatives and a personal history of colonic adenoma.  It is not unreasonable to offer him 1 more colonoscopy. The risks, benefits, limitations, alternatives and imponderables have been reviewed with the patient. Questions have been answered. All parties are agreeable.   We will utilize propofol.  ASA 2.  Further recommendations to follow.     Notice: This dictation was prepared with Dragon dictation along with smaller phrase technology. Any transcriptional errors that result from this process are unintentional and may not be corrected upon review.

## 2021-03-16 ENCOUNTER — Other Ambulatory Visit: Payer: Self-pay

## 2021-03-16 ENCOUNTER — Encounter: Payer: Self-pay | Admitting: Physician Assistant

## 2021-03-16 ENCOUNTER — Ambulatory Visit (INDEPENDENT_AMBULATORY_CARE_PROVIDER_SITE_OTHER): Payer: Medicare Other | Admitting: Physician Assistant

## 2021-03-16 DIAGNOSIS — D0422 Carcinoma in situ of skin of left ear and external auricular canal: Secondary | ICD-10-CM

## 2021-03-16 DIAGNOSIS — L570 Actinic keratosis: Secondary | ICD-10-CM | POA: Diagnosis not present

## 2021-03-16 DIAGNOSIS — D0439 Carcinoma in situ of skin of other parts of face: Secondary | ICD-10-CM

## 2021-03-16 DIAGNOSIS — D0420 Carcinoma in situ of skin of unspecified ear and external auricular canal: Secondary | ICD-10-CM

## 2021-03-16 DIAGNOSIS — C4492 Squamous cell carcinoma of skin, unspecified: Secondary | ICD-10-CM

## 2021-03-16 NOTE — Patient Instructions (Signed)

## 2021-03-30 ENCOUNTER — Encounter: Payer: Self-pay | Admitting: Physician Assistant

## 2021-03-30 NOTE — Progress Notes (Addendum)
   Follow-Up Visit   Subjective  Nicholas Lewis is a 80 y.o. male who presents for the following: Procedure (Here for treatment- right preauricular & left superior Crus of Antihelix-cis x 2).   The following portions of the chart were reviewed this encounter and updated as appropriate:  Tobacco  Allergies  Meds  Problems  Med Hx  Surg Hx  Fam Hx      Objective  Well appearing patient in no apparent distress; mood and affect are within normal limits.  A focused examination was performed including face, and ears. Relevant physical exam findings are noted in the Assessment and Plan.  Objective  Right Forehead: Erythematous patches with gritty scale.  Objective  Left Superior Crus of Antihelix: Scaly pink papule or plaque.   Right Preauricular Area  Objective  Right Preauricular Area: Scaly pink papule or plaque.   Assessment & Plan  AK (actinic keratosis) Right Forehead  Destruction of lesion - Right Forehead Complexity: simple   Destruction method: cryotherapy   Informed consent: discussed and consent obtained   Timeout:  patient name, date of birth, surgical site, and procedure verified Lesion destroyed using liquid nitrogen: Yes   Cryotherapy cycles:  3 Outcome: patient tolerated procedure well with no complications    Carcinoma in situ of skin of ear, unspecified laterality Left Superior Crus of Antihelix  Destruction of lesion Complexity: simple   Destruction method: electrodesiccation and curettage   Informed consent: discussed and consent obtained   Timeout:  patient name, date of birth, surgical site, and procedure verified Anesthesia: the lesion was anesthetized in a standard fashion   Anesthetic:  1% lidocaine w/ epinephrine 1-100,000 local infiltration Curettage performed in three different directions: Yes   Curettage cycles:  3 Margin per side (cm):  0.1 Final wound size (cm):  1 Hemostasis achieved with:  aluminum chloride and ferric  subsulfate Outcome: patient tolerated procedure well with no complications   Post-procedure details: wound care instructions given   Additional details:  Wound innoculated with 5 fluorouracil solution.  Carcinoma in situ of skin of other part of face Right Preauricular Area  Destruction of lesion - Right Preauricular Area Complexity: simple   Destruction method: electrodesiccation and curettage   Informed consent: discussed and consent obtained   Timeout:  patient name, date of birth, surgical site, and procedure verified Anesthesia: the lesion was anesthetized in a standard fashion   Anesthetic:  1% lidocaine w/ epinephrine 1-100,000 local infiltration Curettage performed in three different directions: Yes   Curettage cycles:  3 Final wound size (cm):  1.5 Hemostasis achieved with:  ferric subsulfate Outcome: patient tolerated procedure well with no complications   Additional details:  Wound innoculated with 5 fluorouracil solution.   I, Destiny Hagin, PA-C, have reviewed all documentation's for this visit.  The documentation on 04/13/21 for the exam, diagnosis, procedures and orders are all accurate and complete.

## 2021-04-10 ENCOUNTER — Ambulatory Visit: Payer: Medicare Other | Admitting: Dermatology

## 2021-04-11 DIAGNOSIS — M25511 Pain in right shoulder: Secondary | ICD-10-CM | POA: Diagnosis not present

## 2021-04-18 ENCOUNTER — Other Ambulatory Visit: Payer: Self-pay

## 2021-04-18 ENCOUNTER — Other Ambulatory Visit (HOSPITAL_COMMUNITY)
Admission: RE | Admit: 2021-04-18 | Discharge: 2021-04-18 | Disposition: A | Discharge status: Home / Self Care | Payer: Medicare Other | Source: Ambulatory Visit | Attending: Internal Medicine | Admitting: Internal Medicine

## 2021-04-18 DIAGNOSIS — Z01812 Encounter for preprocedural laboratory examination: Secondary | ICD-10-CM | POA: Insufficient documentation

## 2021-04-18 DIAGNOSIS — Z20822 Contact with and (suspected) exposure to covid-19: Secondary | ICD-10-CM | POA: Diagnosis not present

## 2021-04-18 LAB — SARS CORONAVIRUS 2 (TAT 6-24 HRS): SARS Coronavirus 2: NEGATIVE

## 2021-04-20 ENCOUNTER — Other Ambulatory Visit: Payer: Self-pay

## 2021-04-20 ENCOUNTER — Encounter (HOSPITAL_COMMUNITY): Payer: Self-pay | Admitting: Internal Medicine

## 2021-04-20 ENCOUNTER — Ambulatory Visit (HOSPITAL_COMMUNITY)
Admission: RE | Admit: 2021-04-20 | Discharge: 2021-04-20 | Disposition: A | Discharge status: Home / Self Care | Payer: Medicare Other | Attending: Internal Medicine | Admitting: Internal Medicine

## 2021-04-20 ENCOUNTER — Encounter (HOSPITAL_COMMUNITY)
Admission: RE | Disposition: A | Discharge status: Home / Self Care | Payer: Self-pay | Source: Home / Self Care | Attending: Internal Medicine

## 2021-04-20 ENCOUNTER — Ambulatory Visit (HOSPITAL_COMMUNITY): Payer: Medicare Other | Admitting: Anesthesiology

## 2021-04-20 DIAGNOSIS — K6389 Other specified diseases of intestine: Secondary | ICD-10-CM | POA: Diagnosis not present

## 2021-04-20 DIAGNOSIS — K635 Polyp of colon: Secondary | ICD-10-CM

## 2021-04-20 DIAGNOSIS — Z800 Family history of malignant neoplasm of digestive organs: Secondary | ICD-10-CM | POA: Diagnosis not present

## 2021-04-20 DIAGNOSIS — Z791 Long term (current) use of non-steroidal anti-inflammatories (NSAID): Secondary | ICD-10-CM | POA: Diagnosis not present

## 2021-04-20 DIAGNOSIS — Z885 Allergy status to narcotic agent status: Secondary | ICD-10-CM | POA: Insufficient documentation

## 2021-04-20 DIAGNOSIS — Z882 Allergy status to sulfonamides status: Secondary | ICD-10-CM | POA: Insufficient documentation

## 2021-04-20 DIAGNOSIS — Z888 Allergy status to other drugs, medicaments and biological substances status: Secondary | ICD-10-CM | POA: Diagnosis not present

## 2021-04-20 DIAGNOSIS — Z1211 Encounter for screening for malignant neoplasm of colon: Secondary | ICD-10-CM | POA: Insufficient documentation

## 2021-04-20 DIAGNOSIS — Z79899 Other long term (current) drug therapy: Secondary | ICD-10-CM | POA: Diagnosis not present

## 2021-04-20 DIAGNOSIS — Z85038 Personal history of other malignant neoplasm of large intestine: Secondary | ICD-10-CM | POA: Insufficient documentation

## 2021-04-20 DIAGNOSIS — D120 Benign neoplasm of cecum: Secondary | ICD-10-CM | POA: Insufficient documentation

## 2021-04-20 HISTORY — PX: POLYPECTOMY: SHX5525

## 2021-04-20 HISTORY — PX: COLONOSCOPY WITH PROPOFOL: SHX5780

## 2021-04-20 SURGERY — COLONOSCOPY WITH PROPOFOL
Anesthesia: General

## 2021-04-20 MED ORDER — LACTATED RINGERS IV SOLN: INTRAVENOUS | Status: DC

## 2021-04-20 MED ORDER — PROPOFOL 500 MG/50ML IV EMUL
INTRAVENOUS | Status: DC | PRN
  Administered 2021-04-20: 150 ug/kg/min via INTRAVENOUS
  Administered 2021-04-20: 125 ug/kg/min via INTRAVENOUS

## 2021-04-20 MED ORDER — PROPOFOL 10 MG/ML IV BOLUS
INTRAVENOUS | Status: DC | PRN
  Administered 2021-04-20: 50 mg via INTRAVENOUS
  Administered 2021-04-20: 30 mg via INTRAVENOUS

## 2021-04-20 NOTE — Anesthesia Procedure Notes (Signed)
Performed by: Vista Deck, CRNA Pre-anesthesia Checklist: Patient identified, Emergency Drugs available, Suction available, Timeout performed and Patient being monitored Patient Re-evaluated:Patient Re-evaluated prior to induction Oxygen Delivery Method: Non-rebreather mask

## 2021-04-20 NOTE — Op Note (Signed)
[facility] Patient Name: Nicholas Lewis Procedure Date: 04/20/2021 8:02 AM MRN: [id number] Date of Birth: 16-Jun-1941 Attending MD: Norvel Richards , MD CSN: [id number] Age: 80 Admit Type: Outpatient Procedure:                Colonoscopy Indications:              Screening patient at increased risk: Family history                            of colorectal cancer in multiple 1st-degree                            relatives Providers:                Norvel Richards, MD, Gwenlyn Fudge, RN, Aram Candela Referring MD:              Medicines:                Propofol per Anesthesia Complications:            No immediate complications. Estimated Blood Loss:     Estimated blood loss was minimal. Procedure:                Pre-Anesthesia Assessment:                           - Prior to the procedure, a History and Physical                            was performed, and patient medications and                            allergies were reviewed. The patient's tolerance of                            previous anesthesia was also reviewed. The risks                            and benefits of the procedure and the sedation                            options and risks were discussed with the patient.                            All questions were answered, and informed consent                            was obtained. Prior Anticoagulants: The patient has                            taken no previous anticoagulant or antiplatelet                            agents. ASA Grade Assessment:  III - A patient with                            severe systemic disease. After reviewing the risks                            and benefits, the patient was deemed in                            satisfactory condition to undergo the procedure.                           After obtaining informed consent, the colonoscope                            was passed under direct vision. Throughout  the                            procedure, the patient's blood pressure, pulse, and                            oxygen saturations were monitored continuously. The                            CF-[id number] ([id number]) scope was introduced through                            the anus and advanced to the the cecum, identified                            by appendiceal orifice and ileocecal valve. The                            patient tolerated the procedure well. The quality                            of the bowel preparation was adequate. The                            ileocecal valve, appendiceal orifice, and rectum                            were photographed. The entire colon was well                            visualized. Scope In: 9:01:50 AM Scope Out: 9:35:27 AM Scope Withdrawal Time: 0 hours 8 minutes 35 seconds  Total Procedure Duration: 0 hours 33 minutes 37 seconds  Findings:      The perianal and digital rectal examinations were normal. Markedly       redundant colon requiring external abdominal pressure and changing of       the patient's position to reach the cecum.      A 3 mm polyp was found in the cecum. The polyp was sessile. The polyp  was removed with a cold snare. Resection and retrieval were complete.       Estimated blood loss was minimal.      The exam was otherwise without abnormality on direct and retroflexion       views. Impression:               - One 3 mm polyp in the cecum, removed with a cold                            snare. Resected and retrieved. Redundant, elongated                            colon.                           - The examination was otherwise normal on direct                            and retroflexion views. Moderate Sedation:      Moderate (conscious) sedation was personally administered by an       anesthesia professional. The following parameters were monitored: oxygen       saturation, heart rate, blood pressure, respiratory rate, EKG,  adequacy       of pulmonary ventilation, and response to care. Recommendation:           - Patient has a contact number available for                            emergencies. The signs and symptoms of potential                            delayed complications were discussed with the                            patient. Return to normal activities tomorrow.                            Written discharge instructions were provided to the                            patient.                           - Resume previous diet.                           - Continue present medications.                           - Repeat colonoscopy date to be determined after                            pending pathology results are reviewed for                            surveillance based on pathology results.                           -  Return to GI office (date not yet determined). Procedure Code(s):        --- Professional ---                           [phone], Colonoscopy, flexible; with removal of                            tumor(s), polyp(s), or other lesion(s) by snare                            technique Diagnosis Code(s):        --- Professional ---                           Z80.0, Family history of malignant neoplasm of                            digestive organs                           K63.5, Polyp of colon CPT copyright 2019 [employer]. All rights reserved. The codes documented in this report are preliminary and upon coder review may  be revised to meet current compliance requirements. Cristopher Estimable. Evolet Salminen, MD Norvel Richards, MD 04/20/2021 9:43:32 AM This report has been signed electronically. Number of Addenda: 0

## 2021-04-20 NOTE — Anesthesia Procedure Notes (Signed)
Date/Time: 04/20/2021 8:55 AM Performed by: Vista Deck, CRNA Pre-anesthesia Checklist: Patient identified, Emergency Drugs available, Suction available, Timeout performed and Patient being monitored Patient Re-evaluated:Patient Re-evaluated prior to induction Oxygen Delivery Method: Nasal Cannula

## 2021-04-20 NOTE — Discharge Instructions (Signed)
  Colonoscopy Discharge Instructions  Read the instructions outlined below and refer to this sheet in the next few weeks. These discharge instructions provide you with general information on caring for yourself after you leave the hospital. Your doctor may also give you specific instructions. While your treatment has been planned according to the most current medical practices available, unavoidable complications occasionally occur. If you have any problems or questions after discharge, call Dr. Gala Romney at [phone]. ACTIVITY  You may resume your regular activity, but move at a slower pace for the next 24 hours.   Take frequent rest periods for the next 24 hours.   Walking will help get rid of the air and reduce the bloated feeling in your belly (abdomen).   No driving for 24 hours (because of the medicine (anesthesia) used during the test).    Do not sign any important legal documents or operate any machinery for 24 hours (because of the anesthesia used during the test).  NUTRITION  Drink plenty of fluids.   You may resume your normal diet as instructed by your doctor.   Begin with a light meal and progress to your normal diet. Heavy or fried foods are harder to digest and may make you feel sick to your stomach (nauseated).   Avoid alcoholic beverages for 24 hours or as instructed.  MEDICATIONS  You may resume your normal medications unless your doctor tells you otherwise.  WHAT YOU CAN EXPECT TODAY  Some feelings of bloating in the abdomen.   Passage of more gas than usual.   Spotting of blood in your stool or on the toilet paper.  IF YOU HAD POLYPS REMOVED DURING THE COLONOSCOPY:  No aspirin products for 7 days or as instructed.   No alcohol for 7 days or as instructed.   Eat a soft diet for the next 24 hours.  FINDING OUT THE RESULTS OF YOUR TEST Not all test results are available during your visit. If your test results are not back during the visit, make an appointment  with your caregiver to find out the results. Do not assume everything is normal if you have not heard from your caregiver or the medical facility. It is important for you to follow up on all of your test results.  SEEK IMMEDIATE MEDICAL ATTENTION IF:  You have more than a spotting of blood in your stool.   Your belly is swollen (abdominal distention).   You are nauseated or vomiting.   You have a temperature over 101.   You have abdominal pain or discomfort that is severe or gets worse throughout the day.   1 polyp removed in your colon today  Further recommendations to follow pending review of pathology report  Patient request I called Tora Duck at [phone]

## 2021-04-20 NOTE — Anesthesia Preprocedure Evaluation (Signed)
Anesthesia Evaluation  Patient identified by MRN, date of birth, ID band Patient awake    Reviewed: Allergy & Precautions, H&P , NPO status , Patient's Chart, lab work & pertinent test results, reviewed documented beta blocker date and time   History of Anesthesia Complications (+) PROLONGED EMERGENCE and history of anesthetic complications  Airway Mallampati: II  TM Distance: >3 FB Neck ROM: full    Dental no notable dental hx.    Pulmonary neg pulmonary ROS,    Pulmonary exam normal breath sounds clear to auscultation       Cardiovascular Exercise Tolerance: Good hypertension, negative cardio ROS   Rhythm:regular Rate:Normal     Neuro/Psych negative neurological ROS  negative psych ROS   GI/Hepatic negative GI ROS, Neg liver ROS,   Endo/Other  negative endocrine ROS  Renal/GU negative Renal ROS  negative genitourinary   Musculoskeletal   Abdominal   Peds  Hematology negative hematology ROS (+)   Anesthesia Other Findings   Reproductive/Obstetrics negative OB ROS                             Anesthesia Physical Anesthesia Plan  ASA: II  Anesthesia Plan: General   Post-op Pain Management:    Induction:   PONV Risk Score and Plan: Propofol infusion  Airway Management Planned:   Additional Equipment:   Intra-op Plan:   Post-operative Plan:   Informed Consent: I have reviewed the patients History and Physical, chart, labs and discussed the procedure including the risks, benefits and alternatives for the proposed anesthesia with the patient or authorized representative who has indicated his/her understanding and acceptance.     Dental Advisory Given  Plan Discussed with: CRNA  Anesthesia Plan Comments:         Anesthesia Quick Evaluation

## 2021-04-20 NOTE — H&P (Signed)
@LOGO @   Primary Care Physician:  Sharilyn Sites, MD Primary Gastroenterologist:  Dr. Gala Romney  Pre-Procedure History & Physical: HPI:  Nicholas Lewis is a 80 y.o. male here for high risk screening colonoscopy.  2 first-degree relatives diagnosed with colon cancer previously.  Past Medical History:  Diagnosis Date  . Abrasion    l arm  . Arthritis   . Atypical nevus 10/11/1997   medial left midcalf-mild (Dr. Nevada Crane )  . Atypical nevus 11/24/2002   right back upper- marked (exc)  . Cellulitis 07/2012   after surgery for left knee  . Complication of anesthesia    slow to wake up  . Dog scratch 01/14/2015    office visit note from PCP on chart( to lower lip)   . Gout   . Hypertension   . Pneumonia    hx of 2015   . SCC (squamous cell carcinoma) 12/17/2012   forehead (CX35FU)  . SCC (squamous cell carcinoma) 03/19/2018   right sideburn,sup (CX3FU)   . SCC (squamous cell carcinoma) 03/19/2018   above right eyebrow (CX35FU)  . SCC (squamous cell carcinoma) x 2 05/31/2014   right sideburn (Cx35FU),right shoulder (CX35FU)  . SCCA (squamous cell carcinoma) of skin 07/05/2020   Left Mid Ear Rim (in situ)  . SCCA (squamous cell carcinoma) of skin 07/05/2020   Left Upper Crus of Antihelix (in situ)  . scca in situ 11/21/2004   left sideburn (CX35FU)  . Squamous cell carcinoma of skin 09/08/2020   in situ- upper left mid crus helix ear(CX35FU)  . Squamous cell carcinoma of skin 02/21/2021   right preauricular area- (CX35FU)  . Squamous cell carcinoma of skin 02/21/2021   left superior crus of antilhelix- (CX35FU)    Past Surgical History:  Procedure Laterality Date  . BACK SURGERY    . COLONOSCOPY  11/10/2010   RMR: internal hemorrhoids , anal papilla, otherwise normal rectum.  Long tortuous colon , polyps  in the cecum and sigmoid segment status post removeal as described above.   . COLONOSCOPY N/A 01/05/2016   Dr.Jerry Clyne- normal colonoscopy (redundant colon)   . CYSTECTOMY  2003    from spine  . JOINT REPLACEMENT    . KNEE ARTHROSCOPY  2000   left  . THYROID LOBECTOMY Left 01/20/2015   Procedure: LEFT THYROID LOBECTOMY;  Surgeon: Armandina Gemma, MD;  Location: [facility];  Service: General;  Laterality: Left;  . TOTAL KNEE ARTHROPLASTY  08/06/2012   Procedure: TOTAL KNEE ARTHROPLASTY;  Surgeon: Magnus Sinning, MD;  Location: [facility];  Service: Orthopedics;  Laterality: Left;  . TOTAL KNEE ARTHROPLASTY Right 09/20/2014   Procedure: RIGHT TOTAL KNEE ARTHROPLASTY;  Surgeon: Gearlean Alf, MD;  Location: [facility];  Service: Orthopedics;  Laterality: Right;    Prior to Admission medications   Medication Sig Start Date End Date Taking? Authorizing Provider  acetaminophen (TYLENOL) 325 MG tablet Take 650 mg by mouth every 6 (six) hours as needed.   Yes [provider]  levothyroxine (SYNTHROID, LEVOTHROID) 112 MCG tablet Take 112 mcg by mouth daily before breakfast.  01/18/17  Yes [provider]  losartan (COZAAR) 50 MG tablet Take 100 mg by mouth daily.  03/21/19  Yes [provider]  metoprolol tartrate (LOPRESSOR) 25 MG tablet Take 25 mg by mouth 2 (two) times daily.  01/17/17  Yes [provider]  Multiple Vitamin (MULTIVITAMIN WITH MINERALS) TABS tablet Take 1 tablet by mouth daily.   Yes [provider]  aspirin EC  81 MG tablet Take 81 mg by mouth daily.     [provider]    Allergies as of 03/10/2021 - Review Complete 03/10/2021  Allergen Reaction Noted  . Sulfa antibiotics Other (See Comments) 07/22/2012  . Mobic [meloxicam] Rash 07/23/2012  . Percocet [oxycodone-acetaminophen] Other (See Comments) 09/20/2014    Family History  Problem Relation Age of Onset  . Colon cancer Father 12    Social History   Socioeconomic History  . Marital status: Married    Spouse name: Not on file  . Number of children: Not on file  . Years of education: Not on file  . Highest education level: Not on file  Occupational  History  . Not on file  Tobacco Use  . Smoking status: Never Smoker  . Smokeless tobacco: Never Used  Vaping Use  . Vaping Use: Never used  Substance and Sexual Activity  . Alcohol use: No  . Drug use: No  . Sexual activity: Not on file  Other Topics Concern  . Not on file  Social History Narrative  . Not on file   Social Determinants of Health   Financial Resource Strain: Not on file  Food Insecurity: Not on file  Transportation Needs: Not on file  Physical Activity: Not on file  Stress: Not on file  Social Connections: Not on file  Intimate Partner Violence: Not on file    Review of Systems: See HPI, otherwise negative ROS  Physical Exam: BP (!) 147/80   Temp 97.9 F (36.6 C) (Oral)   Resp 18   Ht 5\' 11"  (1.803 m)   Wt 102.1 kg   SpO2 98%   BMI 31.38 kg/m  General:   Alert,  Well-developed, well-nourished, pleasant and cooperative in NAD Neck:  Supple; no masses or thyromegaly. No significant cervical adenopathy. Lungs:  Clear throughout to auscultation.   No wheezes, crackles, or rhonchi. No acute distress. Heart:  Regular rate and rhythm; no murmurs, clicks, rubs,  or gallops. Abdomen: Non-distended, normal bowel sounds.  Soft and nontender without appreciable mass or hepatosplenomegaly.  Pulses:  Normal pulses noted. Extremities:  Without clubbing or edema.  Impression/Plan: 80 year old gentleman here desirous for 1 more high risk screening colonoscopy per plan. \The risks, benefits, limitations, alternatives and imponderables have been reviewed with the patient. Questions have been answered. All parties are agreeable.      Notice: This dictation was prepared with Dragon dictation along with smaller phrase technology. Any transcriptional errors that result from this process are unintentional and may not be corrected upon review.

## 2021-04-20 NOTE — Transfer of Care (Signed)
Immediate Anesthesia Transfer of Care Note  Patient: Nicholas Lewis  Procedure(s) Performed: COLONOSCOPY WITH PROPOFOL (N/A ) POLYPECTOMY  Patient Location: Endoscopy Unit  Anesthesia Type:General  Level of Consciousness: awake and patient cooperative  Airway & Oxygen Therapy: Patient Spontanous Breathing  Post-op Assessment: Report given to RN and Post -op Vital signs reviewed and stable  Post vital signs: Reviewed and stable  Last Vitals:  Vitals Value Taken Time  BP    Temp    Pulse    Resp    SpO2     SEE VITAL SIGH FLOW SHEET Last Pain:  Vitals:   04/20/21 0856  TempSrc:   PainSc: 0-No pain      Patients Stated Pain Goal: 5 (94/71/25 2712)  Complications: No complications documented.

## 2021-04-20 NOTE — Anesthesia Postprocedure Evaluation (Signed)
Anesthesia Post Note  Patient: Joellyn Haff  Procedure(s) Performed: COLONOSCOPY WITH PROPOFOL (N/A ) POLYPECTOMY  Patient location during evaluation: Phase II Anesthesia Type: General Level of consciousness: awake Pain management: pain level controlled Vital Signs Assessment: post-procedure vital signs reviewed and stable Respiratory status: spontaneous breathing and respiratory function stable Cardiovascular status: blood pressure returned to baseline and stable Postop Assessment: no headache and no apparent nausea or vomiting Anesthetic complications: no Comments: Late entry   No complications documented.   Last Vitals:  Vitals:   04/20/21 0752 04/20/21 0939  BP: (!) 147/80 (!) 100/53  Pulse:  (!) 59  Resp: 18 15  Temp: 36.6 C 36.6 C  SpO2: 98% 100%    Last Pain:  Vitals:   04/20/21 0939  TempSrc: Oral  PainSc: 0-No pain                 Louann Sjogren

## 2021-04-21 LAB — SURGICAL PATHOLOGY

## 2021-04-22 ENCOUNTER — Encounter: Payer: Self-pay | Admitting: Internal Medicine

## 2021-04-24 ENCOUNTER — Encounter (HOSPITAL_COMMUNITY): Payer: Self-pay | Admitting: Internal Medicine

## 2021-05-02 DIAGNOSIS — J029 Acute pharyngitis, unspecified: Secondary | ICD-10-CM | POA: Diagnosis not present

## 2021-05-02 DIAGNOSIS — Z681 Body mass index (BMI) 19 or less, adult: Secondary | ICD-10-CM | POA: Diagnosis not present

## 2021-05-02 DIAGNOSIS — R591 Generalized enlarged lymph nodes: Secondary | ICD-10-CM | POA: Diagnosis not present

## 2021-05-04 DIAGNOSIS — M25511 Pain in right shoulder: Secondary | ICD-10-CM | POA: Diagnosis not present

## 2021-05-12 DIAGNOSIS — M25511 Pain in right shoulder: Secondary | ICD-10-CM | POA: Diagnosis not present

## 2021-06-19 DIAGNOSIS — M549 Dorsalgia, unspecified: Secondary | ICD-10-CM | POA: Diagnosis not present

## 2021-06-19 DIAGNOSIS — E6609 Other obesity due to excess calories: Secondary | ICD-10-CM | POA: Diagnosis not present

## 2021-06-19 DIAGNOSIS — J029 Acute pharyngitis, unspecified: Secondary | ICD-10-CM | POA: Diagnosis not present

## 2021-06-19 DIAGNOSIS — Z6835 Body mass index (BMI) 35.0-35.9, adult: Secondary | ICD-10-CM | POA: Diagnosis not present

## 2021-06-28 ENCOUNTER — Ambulatory Visit: Payer: Medicare Other | Admitting: Physician Assistant

## 2021-06-28 ENCOUNTER — Other Ambulatory Visit: Payer: Self-pay

## 2021-06-28 ENCOUNTER — Encounter: Payer: Self-pay | Admitting: Physician Assistant

## 2021-06-28 DIAGNOSIS — L570 Actinic keratosis: Secondary | ICD-10-CM | POA: Diagnosis not present

## 2021-06-28 DIAGNOSIS — Z86007 Personal history of in-situ neoplasm of skin: Secondary | ICD-10-CM | POA: Diagnosis not present

## 2021-06-28 NOTE — Progress Notes (Signed)
   Follow-Up Visit   Subjective  Nicholas Lewis is a 80 y.o. male who presents for the following: Follow-up (Patient is here to follow up on his face. We froze some lesions last visit and also has CIS treatment on left ear. Patient said there may be a little crust there. Also CIS treatment right preauricular area. This area is doing well per patient. ).   The following portions of the chart were reviewed this encounter and updated as appropriate:  Tobacco  Allergies  Meds  Problems  Med Hx  Surg Hx  Fam Hx       Objective  Well appearing patient in no apparent distress; mood and affect are within normal limits.  A focused examination was performed including face, ears and forearms. Relevant physical exam findings are noted in the Assessment and Plan.  Left Frontal Scalp, Left Superior Helix (3), Right Superior Helix (2) Erythematous patches with gritty scale.  Left Superior Crus of Antihelix, Right Preauricular Area Clear   Assessment & Plan  AK (actinic keratosis) (6) Left Superior Helix (3); Right Superior Helix (2); Left Frontal Scalp  Destruction of lesion - Left Frontal Scalp, Left Superior Helix, Right Superior Helix Complexity: simple   Destruction method: cryotherapy   Informed consent: discussed and consent obtained   Timeout:  patient name, date of birth, surgical site, and procedure verified Lesion destroyed using liquid nitrogen: Yes   Cryotherapy cycles:  1 Outcome: patient tolerated procedure well with no complications   Post-procedure details: wound care instructions given    History of squamous cell carcinoma in situ (SCCIS) of skin (2) Left Superior Crus of Antihelix; Right Preauricular Area  6 month follow up. Return if recurs    I, Velina Drollinger, PA-C, have reviewed all documentation's for this visit.  The documentation on 06/28/21 for the exam, diagnosis, procedures and orders are all accurate and complete.

## 2021-07-05 ENCOUNTER — Ambulatory Visit: Payer: Medicare Other | Admitting: Dermatology

## 2021-07-11 ENCOUNTER — Other Ambulatory Visit: Payer: Self-pay

## 2021-07-11 ENCOUNTER — Other Ambulatory Visit: Payer: Self-pay | Admitting: Family Medicine

## 2021-07-11 ENCOUNTER — Other Ambulatory Visit (HOSPITAL_COMMUNITY): Payer: Self-pay | Admitting: Family Medicine

## 2021-07-11 ENCOUNTER — Ambulatory Visit (HOSPITAL_COMMUNITY)
Admission: RE | Admit: 2021-07-11 | Discharge: 2021-07-11 | Disposition: A | Discharge status: Home / Self Care | Payer: Medicare Other | Source: Ambulatory Visit | Attending: Family Medicine | Admitting: Family Medicine

## 2021-07-11 DIAGNOSIS — R109 Unspecified abdominal pain: Secondary | ICD-10-CM | POA: Insufficient documentation

## 2021-07-11 DIAGNOSIS — Z6835 Body mass index (BMI) 35.0-35.9, adult: Secondary | ICD-10-CM | POA: Diagnosis not present

## 2021-07-11 DIAGNOSIS — E6609 Other obesity due to excess calories: Secondary | ICD-10-CM | POA: Diagnosis not present

## 2021-07-11 LAB — POCT I-STAT CREATININE: Creatinine, Ser: 1.1 mg/dL (ref 0.61–1.24)

## 2021-07-11 MED ORDER — IOHEXOL 300 MG/ML  SOLN
100.0000 mL | Freq: Once | INTRAMUSCULAR | Status: AC | PRN
  Administered 2021-07-11: 100 mL via INTRAVENOUS

## 2021-07-11 MED ORDER — IOHEXOL 9 MG/ML PO SOLN
ORAL | Status: AC
  Filled 2021-07-11: qty 1000

## 2021-07-17 DIAGNOSIS — R850 Abnormal level of enzymes in specimens from digestive organs and abdominal cavity: Secondary | ICD-10-CM | POA: Diagnosis not present

## 2021-07-19 ENCOUNTER — Other Ambulatory Visit (HOSPITAL_COMMUNITY): Payer: Self-pay | Admitting: Family Medicine

## 2021-07-19 DIAGNOSIS — E6609 Other obesity due to excess calories: Secondary | ICD-10-CM | POA: Diagnosis not present

## 2021-07-19 DIAGNOSIS — R109 Unspecified abdominal pain: Secondary | ICD-10-CM | POA: Diagnosis not present

## 2021-07-19 DIAGNOSIS — Z6834 Body mass index (BMI) 34.0-34.9, adult: Secondary | ICD-10-CM | POA: Diagnosis not present

## 2021-07-19 DIAGNOSIS — M5136 Other intervertebral disc degeneration, lumbar region: Secondary | ICD-10-CM | POA: Diagnosis not present

## 2021-07-19 DIAGNOSIS — M51369 Other intervertebral disc degeneration, lumbar region without mention of lumbar back pain or lower extremity pain: Secondary | ICD-10-CM

## 2021-07-20 ENCOUNTER — Encounter: Payer: Self-pay | Admitting: Internal Medicine

## 2021-07-31 ENCOUNTER — Ambulatory Visit (HOSPITAL_COMMUNITY)
Admission: RE | Admit: 2021-07-31 | Discharge: 2021-07-31 | Disposition: A | Discharge status: Home / Self Care | Payer: Medicare Other | Source: Ambulatory Visit | Attending: Family Medicine | Admitting: Family Medicine

## 2021-07-31 ENCOUNTER — Other Ambulatory Visit: Payer: Self-pay

## 2021-07-31 DIAGNOSIS — M5136 Other intervertebral disc degeneration, lumbar region: Secondary | ICD-10-CM | POA: Diagnosis not present

## 2021-07-31 DIAGNOSIS — M545 Low back pain, unspecified: Secondary | ICD-10-CM | POA: Diagnosis not present

## 2021-07-31 DIAGNOSIS — M51369 Other intervertebral disc degeneration, lumbar region without mention of lumbar back pain or lower extremity pain: Secondary | ICD-10-CM

## 2021-08-31 DIAGNOSIS — E6609 Other obesity due to excess calories: Secondary | ICD-10-CM | POA: Diagnosis not present

## 2021-08-31 DIAGNOSIS — R748 Abnormal levels of other serum enzymes: Secondary | ICD-10-CM | POA: Diagnosis not present

## 2021-08-31 DIAGNOSIS — Z6833 Body mass index (BMI) 33.0-33.9, adult: Secondary | ICD-10-CM | POA: Diagnosis not present

## 2021-08-31 DIAGNOSIS — K219 Gastro-esophageal reflux disease without esophagitis: Secondary | ICD-10-CM | POA: Diagnosis not present

## 2021-09-08 DIAGNOSIS — H43811 Vitreous degeneration, right eye: Secondary | ICD-10-CM | POA: Diagnosis not present

## 2021-09-08 DIAGNOSIS — H35033 Hypertensive retinopathy, bilateral: Secondary | ICD-10-CM | POA: Diagnosis not present

## 2021-09-08 DIAGNOSIS — H04123 Dry eye syndrome of bilateral lacrimal glands: Secondary | ICD-10-CM | POA: Diagnosis not present

## 2021-09-08 DIAGNOSIS — H2513 Age-related nuclear cataract, bilateral: Secondary | ICD-10-CM | POA: Diagnosis not present

## 2021-09-12 ENCOUNTER — Ambulatory Visit: Payer: Medicare Other | Admitting: Internal Medicine

## 2021-09-25 DIAGNOSIS — Z23 Encounter for immunization: Secondary | ICD-10-CM | POA: Diagnosis not present

## 2021-10-05 DIAGNOSIS — Z23 Encounter for immunization: Secondary | ICD-10-CM | POA: Diagnosis not present

## 2021-10-24 ENCOUNTER — Ambulatory Visit: Payer: Medicare Other | Admitting: Dermatology

## 2021-12-27 ENCOUNTER — Other Ambulatory Visit: Payer: Self-pay

## 2021-12-27 ENCOUNTER — Encounter: Payer: Self-pay | Admitting: Dermatology

## 2021-12-27 ENCOUNTER — Ambulatory Visit: Payer: Medicare Other | Admitting: Dermatology

## 2021-12-27 DIAGNOSIS — L821 Other seborrheic keratosis: Secondary | ICD-10-CM | POA: Diagnosis not present

## 2021-12-27 DIAGNOSIS — Z1283 Encounter for screening for malignant neoplasm of skin: Secondary | ICD-10-CM | POA: Diagnosis not present

## 2021-12-27 DIAGNOSIS — L570 Actinic keratosis: Secondary | ICD-10-CM

## 2021-12-27 DIAGNOSIS — M721 Knuckle pads: Secondary | ICD-10-CM

## 2022-01-26 ENCOUNTER — Encounter: Payer: Self-pay | Admitting: Dermatology

## 2022-01-26 NOTE — Progress Notes (Signed)
° °  Follow-Up Visit   Subjective  Nicholas Lewis is a 81 y.o. male who presents for the following: Skin Problem (Few scaly spots on face I want check- no itch no bleed).  General skin check, several new crusts Location:  Duration:  Quality:  Associated Signs/Symptoms: Modifying Factors:  Severity:  Timing: Context:   Objective  Well appearing patient in no apparent distress; mood and affect are within normal limits. Mid Back Full body skin exam.  No atypical pigmented lesions or nonmelanoma skin cancer.  Mid Back, Neck - Anterior Several flattopped brown textured papules, typical dermoscopy  Left Dorsal Mid 3rd Finger Textured pink thickening PIP dorsal  Left Ear (3), Left Malar Cheek, Mid Tip of Nose, Right Zygomatic Area (2) Multiple gritty and hornlike 2 to 4 mm pink crust    All skin waist up examined.   Assessment & Plan    Encounter for screening for malignant neoplasm of skin Mid Back  Annual skin examination, encouraged to self examine with spouse twice annually.  Seborrheic keratosis (2) Neck - Anterior; Mid Back  No intervention necessary unless clinical change  Knuckle pad of left hand Left Dorsal Mid 3rd Finger  No intervention initiated  AK (actinic keratosis) (7) Left Ear (3); Mid Tip of Nose; Right Zygomatic Area (2); Left Malar Cheek  Destruction of lesion - Left Ear, Left Malar Cheek, Mid Tip of Nose, Right Zygomatic Area Complexity: simple   Destruction method: cryotherapy   Informed consent: discussed and consent obtained   Timeout:  patient name, date of birth, surgical site, and procedure verified Lesion destroyed using liquid nitrogen: Yes   Cryotherapy cycles:  3 Outcome: patient tolerated procedure well with no complications   Post-procedure details: wound care instructions given        I, [name], MD, have reviewed all documentation for this visit.  The documentation on 01/26/22 for the exam, diagnosis, procedures,  and orders are all accurate and complete.

## 2022-04-04 DIAGNOSIS — E6609 Other obesity due to excess calories: Secondary | ICD-10-CM | POA: Diagnosis not present

## 2022-04-04 DIAGNOSIS — Z6833 Body mass index (BMI) 33.0-33.9, adult: Secondary | ICD-10-CM | POA: Diagnosis not present

## 2022-04-04 DIAGNOSIS — J029 Acute pharyngitis, unspecified: Secondary | ICD-10-CM | POA: Diagnosis not present

## 2022-04-04 DIAGNOSIS — R591 Generalized enlarged lymph nodes: Secondary | ICD-10-CM | POA: Diagnosis not present

## 2022-05-07 DIAGNOSIS — J069 Acute upper respiratory infection, unspecified: Secondary | ICD-10-CM | POA: Diagnosis not present

## 2022-05-07 DIAGNOSIS — Z6833 Body mass index (BMI) 33.0-33.9, adult: Secondary | ICD-10-CM | POA: Diagnosis not present

## 2022-05-07 DIAGNOSIS — E6609 Other obesity due to excess calories: Secondary | ICD-10-CM | POA: Diagnosis not present

## 2022-05-11 ENCOUNTER — Other Ambulatory Visit (HOSPITAL_COMMUNITY): Payer: Self-pay | Admitting: Family Medicine

## 2022-05-11 ENCOUNTER — Ambulatory Visit (HOSPITAL_COMMUNITY)
Admission: RE | Admit: 2022-05-11 | Discharge: 2022-05-11 | Disposition: A | Discharge status: Home / Self Care | Payer: Medicare Other | Source: Ambulatory Visit | Attending: Family Medicine | Admitting: Family Medicine

## 2022-05-11 DIAGNOSIS — R059 Cough, unspecified: Secondary | ICD-10-CM | POA: Diagnosis not present

## 2022-05-11 DIAGNOSIS — J069 Acute upper respiratory infection, unspecified: Secondary | ICD-10-CM | POA: Diagnosis not present

## 2022-05-11 DIAGNOSIS — Z6833 Body mass index (BMI) 33.0-33.9, adult: Secondary | ICD-10-CM | POA: Diagnosis not present

## 2022-05-11 DIAGNOSIS — J9811 Atelectasis: Secondary | ICD-10-CM | POA: Diagnosis not present

## 2022-05-11 DIAGNOSIS — E6609 Other obesity due to excess calories: Secondary | ICD-10-CM | POA: Diagnosis not present

## 2022-05-23 DIAGNOSIS — Z6832 Body mass index (BMI) 32.0-32.9, adult: Secondary | ICD-10-CM | POA: Diagnosis not present

## 2022-05-23 DIAGNOSIS — E782 Mixed hyperlipidemia: Secondary | ICD-10-CM | POA: Diagnosis not present

## 2022-05-23 DIAGNOSIS — Z0001 Encounter for general adult medical examination with abnormal findings: Secondary | ICD-10-CM | POA: Diagnosis not present

## 2022-05-23 DIAGNOSIS — Z1331 Encounter for screening for depression: Secondary | ICD-10-CM | POA: Diagnosis not present

## 2022-05-23 DIAGNOSIS — E7849 Other hyperlipidemia: Secondary | ICD-10-CM | POA: Diagnosis not present

## 2022-05-23 DIAGNOSIS — R591 Generalized enlarged lymph nodes: Secondary | ICD-10-CM | POA: Diagnosis not present

## 2022-05-23 DIAGNOSIS — D6949 Other primary thrombocytopenia: Secondary | ICD-10-CM | POA: Diagnosis not present

## 2022-05-30 DIAGNOSIS — M25561 Pain in right knee: Secondary | ICD-10-CM | POA: Diagnosis not present

## 2022-05-30 DIAGNOSIS — M228X1 Other disorders of patella, right knee: Secondary | ICD-10-CM | POA: Diagnosis not present

## 2022-06-13 DIAGNOSIS — M25561 Pain in right knee: Secondary | ICD-10-CM | POA: Diagnosis not present

## 2022-07-19 DIAGNOSIS — Z6833 Body mass index (BMI) 33.0-33.9, adult: Secondary | ICD-10-CM | POA: Diagnosis not present

## 2022-07-19 DIAGNOSIS — C4400 Unspecified malignant neoplasm of skin of lip: Secondary | ICD-10-CM | POA: Diagnosis not present

## 2022-07-19 DIAGNOSIS — E6609 Other obesity due to excess calories: Secondary | ICD-10-CM | POA: Diagnosis not present

## 2022-07-19 DIAGNOSIS — C4402 Squamous cell carcinoma of skin of lip: Secondary | ICD-10-CM | POA: Diagnosis not present

## 2022-08-22 DIAGNOSIS — C4402 Squamous cell carcinoma of skin of lip: Secondary | ICD-10-CM | POA: Diagnosis not present

## 2022-08-22 DIAGNOSIS — C009 Malignant neoplasm of lip, unspecified: Secondary | ICD-10-CM | POA: Diagnosis not present

## 2022-08-30 ENCOUNTER — Telehealth: Payer: Self-pay | Admitting: *Deleted

## 2022-08-30 ENCOUNTER — Encounter: Payer: Self-pay | Admitting: *Deleted

## 2022-09-04 NOTE — Patient Outreach (Signed)
  Care Coordination   Initial Visit Note   08/30/2022 Name: Nicholas Lewis MRN: [id number] DOB: 1941/06/22  Nicholas Lewis is a 81 y.o. year old male who sees Sharilyn Sites, MD for primary care. I spoke with  Nicholas Lewis by phone today.  What matters to the patients health and wellness today?  Ongoing self health management    Goals Addressed             This Visit's Progress    COMPLETED: Care Coordination Services (no follow-up needed)       Care Coordination Interventions: Assessed social determinant of health barriers Assessed mobility and ability to perform ADLs Assessed family/Social support Provided patient/caregiver with verbal information on [place] [phone]) Encouraged patient to request a referral for [place] from PCP if services are needed in the future          SDOH assessments and interventions completed:  Yes  SDOH Interventions Today    Flowsheet Row Most Recent Value  SDOH Interventions   Housing Interventions Intervention Not Indicated  Transportation Interventions Intervention Not Indicated  Financial Strain Interventions Intervention Not Indicated        Care Coordination Interventions Activated:  Yes  Care Coordination Interventions:  Yes, provided   Follow up plan: No further intervention required.   Encounter Outcome:  Pt. Visit Completed   Chong Sicilian, BSN, RN-BC [facility] Pharmacist, community Dial: [phone]

## 2022-09-10 DIAGNOSIS — H35033 Hypertensive retinopathy, bilateral: Secondary | ICD-10-CM | POA: Diagnosis not present

## 2022-09-10 DIAGNOSIS — H43811 Vitreous degeneration, right eye: Secondary | ICD-10-CM | POA: Diagnosis not present

## 2022-09-10 DIAGNOSIS — H25813 Combined forms of age-related cataract, bilateral: Secondary | ICD-10-CM | POA: Diagnosis not present

## 2022-09-10 DIAGNOSIS — H04123 Dry eye syndrome of bilateral lacrimal glands: Secondary | ICD-10-CM | POA: Diagnosis not present

## 2022-09-12 DIAGNOSIS — Z5189 Encounter for other specified aftercare: Secondary | ICD-10-CM | POA: Diagnosis not present

## 2022-09-12 DIAGNOSIS — Z483 Aftercare following surgery for neoplasm: Secondary | ICD-10-CM | POA: Diagnosis not present

## 2022-09-12 DIAGNOSIS — C4402 Squamous cell carcinoma of skin of lip: Secondary | ICD-10-CM | POA: Diagnosis not present

## 2022-10-01 ENCOUNTER — Ambulatory Visit (HOSPITAL_COMMUNITY)
Admission: RE | Admit: 2022-10-01 | Discharge: 2022-10-01 | Disposition: A | Discharge status: Home / Self Care | Payer: Medicare Other | Source: Ambulatory Visit | Attending: Internal Medicine | Admitting: Internal Medicine

## 2022-10-01 ENCOUNTER — Other Ambulatory Visit (HOSPITAL_COMMUNITY): Payer: Self-pay | Admitting: Internal Medicine

## 2022-10-01 DIAGNOSIS — Z6833 Body mass index (BMI) 33.0-33.9, adult: Secondary | ICD-10-CM | POA: Diagnosis not present

## 2022-10-01 DIAGNOSIS — M159 Polyosteoarthritis, unspecified: Secondary | ICD-10-CM | POA: Diagnosis not present

## 2022-10-01 DIAGNOSIS — M25532 Pain in left wrist: Secondary | ICD-10-CM | POA: Diagnosis not present

## 2022-10-01 DIAGNOSIS — I10 Essential (primary) hypertension: Secondary | ICD-10-CM | POA: Diagnosis not present

## 2022-11-07 DIAGNOSIS — H25812 Combined forms of age-related cataract, left eye: Secondary | ICD-10-CM | POA: Diagnosis not present

## 2022-11-07 DIAGNOSIS — H25813 Combined forms of age-related cataract, bilateral: Secondary | ICD-10-CM | POA: Diagnosis not present

## 2022-11-07 DIAGNOSIS — H35033 Hypertensive retinopathy, bilateral: Secondary | ICD-10-CM | POA: Diagnosis not present

## 2022-11-15 ENCOUNTER — Ambulatory Visit: Payer: Medicare Other | Admitting: Physician Assistant

## 2022-11-28 DIAGNOSIS — L570 Actinic keratosis: Secondary | ICD-10-CM | POA: Diagnosis not present

## 2022-11-28 DIAGNOSIS — Z85828 Personal history of other malignant neoplasm of skin: Secondary | ICD-10-CM | POA: Diagnosis not present

## 2022-11-28 DIAGNOSIS — L821 Other seborrheic keratosis: Secondary | ICD-10-CM | POA: Diagnosis not present

## 2022-12-03 ENCOUNTER — Ambulatory Visit: Payer: BLUE CROSS/BLUE SHIELD | Admitting: Podiatry

## 2022-12-05 DIAGNOSIS — H25812 Combined forms of age-related cataract, left eye: Secondary | ICD-10-CM | POA: Diagnosis not present

## 2022-12-05 DIAGNOSIS — H268 Other specified cataract: Secondary | ICD-10-CM | POA: Diagnosis not present

## 2022-12-10 ENCOUNTER — Ambulatory Visit (INDEPENDENT_AMBULATORY_CARE_PROVIDER_SITE_OTHER): Payer: Medicare Other | Admitting: Podiatry

## 2022-12-10 ENCOUNTER — Encounter: Payer: Self-pay | Admitting: Podiatry

## 2022-12-10 ENCOUNTER — Ambulatory Visit (INDEPENDENT_AMBULATORY_CARE_PROVIDER_SITE_OTHER): Payer: Medicare Other

## 2022-12-10 VITALS — BP 160/89

## 2022-12-10 DIAGNOSIS — M2042 Other hammer toe(s) (acquired), left foot: Secondary | ICD-10-CM

## 2022-12-10 DIAGNOSIS — M2041 Other hammer toe(s) (acquired), right foot: Secondary | ICD-10-CM

## 2022-12-10 NOTE — Progress Notes (Signed)
Subjective:   Patient ID: Nicholas Lewis, male   DOB: 81 y.o.   MRN: [id number]   HPI Patient presents with rigid contracture digit to left is gotten worse over the last few months and he may have had an injury to it.  States it is getting sore he has had some bleeding on top of the toe and it is very hard to wear shoe gear.  He does state it is rigid also has high blood pressure that he will see his family physician for and patient does not smoke likes to be active   Review of Systems  All other systems reviewed and are negative.       Objective:  Physical Exam Vitals and nursing note reviewed.  Constitutional:      Appearance: He is well-developed.  Pulmonary:     Effort: Pulmonary effort is normal.  Musculoskeletal:        General: Normal range of motion.  Skin:    General: Skin is warm.  Neurological:     Mental Status: He is alert.     Neurovascular status intact muscle strength found to be adequate range of motion adequate with rigid contracture digit to left painful when pressed with some abrasion of the skin secondary to the rigid nature of the deformity.  Right shows mild deformity not to the same degree as the left and patient has good digital perfusion well-oriented x 3     Assessment:  Probability for a flexor plate disruption or tear causing the second toe left elevate and become rigidly contracted with pain and has had some abrasion in the past with mild deformity right     Plan:  H&P reviewed both conditions and due to the rigid contracture left I do think digital fusion is best.  I explained procedure I explained risk and reviewed with the patient the surgery and what would be required.  Patient wants surgery understanding what will be required and after extensive review signed consent form after reviewing alternative treatments complications.  Scheduled for outpatient surgery all questions answered today  X-rays indicate significant dorsal dislocation digit to  left over right with rigid contracture noted

## 2022-12-11 ENCOUNTER — Telehealth: Payer: Self-pay | Admitting: Podiatry

## 2022-12-11 NOTE — Telephone Encounter (Signed)
Called patient to confirm he will be filing [employer] Medicare for 2024 and patient stated he would. I asked if he has received his new insurance card yet as the ID will be different than what we have on file for this year. Patient stated he has not received his new card but would call me with his new ID number once he has it. Gave patient my direct number to call and give it to me so I can see if prior authorization for his surgery will be required.

## 2022-12-12 NOTE — Telephone Encounter (Signed)
This is Kayla A with [employer] calling regarding Randon Somera. You can give [place] a call back at [phone].

## 2022-12-27 ENCOUNTER — Ambulatory Visit: Payer: Medicare Other | Admitting: Podiatry

## 2022-12-27 ENCOUNTER — Encounter: Payer: Self-pay | Admitting: Podiatry

## 2022-12-27 DIAGNOSIS — L84 Corns and callosities: Secondary | ICD-10-CM

## 2022-12-27 DIAGNOSIS — M2041 Other hammer toe(s) (acquired), right foot: Secondary | ICD-10-CM

## 2022-12-27 DIAGNOSIS — M2042 Other hammer toe(s) (acquired), left foot: Secondary | ICD-10-CM

## 2022-12-27 NOTE — Progress Notes (Signed)
Subjective:   Patient ID: Nicholas Lewis, male   DOB: 81 y.o.   MRN: [id number]   HPI Patient presents with a painful lesion on the left heel and is concerned about it as its come up in the last few weeks as he is due to have surgery Neuro vas   ROS      Objective:  Physical Exam neurovascular status intact with keratotic lesion subleft heel measuring about 5 x 5 mm painful when pressed with digital deformity second left that will be corrected next week     Assessment:  Painful lesion left plantar foot which may be a corn callus or porokeratotic type tissue     Plan:  Debridement lesion nitrogen and bleeding relieved pain reappoint routine care and having surgery next week to fuse the second digit left

## 2022-12-28 ENCOUNTER — Telehealth: Payer: Self-pay | Admitting: Podiatry

## 2022-12-28 NOTE — Telephone Encounter (Signed)
DOS: 01/01/2023  [employer] Medicare  [employer] w/ Pin 2nd Lt ([id number])  Deductible: $0 Out-of-Pocket: $3,500 with $0 met CoInsurance: $3,500 with $0 met  Prior authorization is not required per Willey Blade.  Call Reference #: [id number]

## 2023-01-01 ENCOUNTER — Encounter: Payer: Self-pay | Admitting: Podiatry

## 2023-01-01 DIAGNOSIS — M2042 Other hammer toe(s) (acquired), left foot: Secondary | ICD-10-CM | POA: Diagnosis not present

## 2023-01-07 ENCOUNTER — Ambulatory Visit (INDEPENDENT_AMBULATORY_CARE_PROVIDER_SITE_OTHER): Payer: Medicare Other | Admitting: *Deleted

## 2023-01-07 ENCOUNTER — Ambulatory Visit (INDEPENDENT_AMBULATORY_CARE_PROVIDER_SITE_OTHER): Payer: Medicare Other

## 2023-01-07 DIAGNOSIS — M2042 Other hammer toe(s) (acquired), left foot: Secondary | ICD-10-CM | POA: Diagnosis not present

## 2023-01-07 DIAGNOSIS — Z9889 Other specified postprocedural states: Secondary | ICD-10-CM

## 2023-01-07 DIAGNOSIS — Z0100 Encounter for examination of eyes and vision without abnormal findings: Secondary | ICD-10-CM | POA: Diagnosis not present

## 2023-01-07 NOTE — Progress Notes (Signed)
Patient presents today for post op visit # 1, patient of [facility].   POV #1 DOS 01/01/2023 HAMMERTOE 2ND LT/DR [facility] PT    Patient presents in his surgical shoe. Denies any falls or injury to the foot. Foot is swollen. No signs of infection. No calf pain or shortness of breath. Bandages dry and intact. Incision is intact. Only taking Ibuprofen or Tylenol for pain control.   BP: 164/83  P: 62   Xrays taken today and reviewed by Dr. Paulla Dolly. He did take a look at him foot today as well.   Foot redressed today with acewrap and placed back in the shoe. He can shower regular per Dr. Mellody Drown instruction. Reviewed icing and elevation. Patient will follow up with Dr. Paulla Dolly in 2 weeks for POV# 2 with xray and suture removal.

## 2023-01-08 ENCOUNTER — Ambulatory Visit: Payer: Medicare Other | Admitting: Dermatology

## 2023-01-21 ENCOUNTER — Ambulatory Visit (INDEPENDENT_AMBULATORY_CARE_PROVIDER_SITE_OTHER): Payer: Medicare Other | Admitting: *Deleted

## 2023-01-21 ENCOUNTER — Ambulatory Visit (INDEPENDENT_AMBULATORY_CARE_PROVIDER_SITE_OTHER): Payer: Medicare Other

## 2023-01-21 DIAGNOSIS — M2042 Other hammer toe(s) (acquired), left foot: Secondary | ICD-10-CM

## 2023-01-21 DIAGNOSIS — Z9889 Other specified postprocedural states: Secondary | ICD-10-CM

## 2023-01-21 NOTE — Progress Notes (Signed)
Patient presents today for post op visit # 2, patient of Dr. Paulla Dolly.   POV #2 DOS 01/01/2023 HAMMERTOE 2ND LT    Sutures removed today. Patient states he hasn't really had any pain. He is still wearing his surgical shoe and splinting the toe with gauze and ace wrap daily.   Reviewed icing and elevation. Patient will follow up with Dr. Paulla Dolly in 2 week for POV# 3, xray and possible kwire removal.

## 2023-02-04 ENCOUNTER — Ambulatory Visit (INDEPENDENT_AMBULATORY_CARE_PROVIDER_SITE_OTHER): Payer: Medicare Other

## 2023-02-04 ENCOUNTER — Ambulatory Visit (INDEPENDENT_AMBULATORY_CARE_PROVIDER_SITE_OTHER): Payer: Medicare Other | Admitting: *Deleted

## 2023-02-04 DIAGNOSIS — Z9889 Other specified postprocedural states: Secondary | ICD-10-CM

## 2023-02-04 DIAGNOSIS — M2042 Other hammer toe(s) (acquired), left foot: Secondary | ICD-10-CM

## 2023-02-04 NOTE — Progress Notes (Signed)
Patient presents today for post op visit # 3, patient of Dr. Paulla Dolly.   POV #3 DOS 01/01/2023 HAMMERTOE 2ND LT    Sutures removed today. Patient states its been a little achy, but no real pain. He is still wearing his surgical shoe and his acewrap today.  Dr. Paulla Dolly removed Herma Ard and xrays obtained and reviewed.    Reviewed icing and elevation. Patient is now able to wear his regular shoe. Patient will follow up with Dr. Paulla Dolly as needed.

## 2023-03-18 DIAGNOSIS — K080 Exfoliation of teeth due to systemic causes: Secondary | ICD-10-CM | POA: Diagnosis not present

## 2023-03-22 DIAGNOSIS — J069 Acute upper respiratory infection, unspecified: Secondary | ICD-10-CM | POA: Diagnosis not present

## 2023-03-22 DIAGNOSIS — Z6833 Body mass index (BMI) 33.0-33.9, adult: Secondary | ICD-10-CM | POA: Diagnosis not present

## 2023-03-22 DIAGNOSIS — E6609 Other obesity due to excess calories: Secondary | ICD-10-CM | POA: Diagnosis not present

## 2023-03-22 DIAGNOSIS — S60459A Superficial foreign body of unspecified finger, initial encounter: Secondary | ICD-10-CM | POA: Diagnosis not present

## 2023-03-25 ENCOUNTER — Ambulatory Visit (HOSPITAL_COMMUNITY)
Admission: RE | Admit: 2023-03-25 | Discharge: 2023-03-25 | Disposition: A | Discharge status: Home / Self Care | Payer: Medicare Other | Source: Ambulatory Visit | Attending: Family Medicine | Admitting: Family Medicine

## 2023-03-25 ENCOUNTER — Other Ambulatory Visit (HOSPITAL_COMMUNITY): Payer: Self-pay | Admitting: Family Medicine

## 2023-03-25 DIAGNOSIS — R0602 Shortness of breath: Secondary | ICD-10-CM | POA: Diagnosis not present

## 2023-03-25 DIAGNOSIS — J069 Acute upper respiratory infection, unspecified: Secondary | ICD-10-CM

## 2023-03-25 DIAGNOSIS — Z6833 Body mass index (BMI) 33.0-33.9, adult: Secondary | ICD-10-CM | POA: Diagnosis not present

## 2023-03-25 DIAGNOSIS — R6889 Other general symptoms and signs: Secondary | ICD-10-CM | POA: Diagnosis not present

## 2023-03-25 DIAGNOSIS — R059 Cough, unspecified: Secondary | ICD-10-CM | POA: Diagnosis not present

## 2023-03-25 DIAGNOSIS — E6609 Other obesity due to excess calories: Secondary | ICD-10-CM | POA: Diagnosis not present

## 2023-03-25 DIAGNOSIS — Z20828 Contact with and (suspected) exposure to other viral communicable diseases: Secondary | ICD-10-CM | POA: Diagnosis not present

## 2023-04-08 DIAGNOSIS — T603X1A Toxic effect of herbicides and fungicides, accidental (unintentional), initial encounter: Secondary | ICD-10-CM | POA: Diagnosis not present

## 2023-04-08 DIAGNOSIS — R059 Cough, unspecified: Secondary | ICD-10-CM | POA: Diagnosis not present

## 2023-04-08 DIAGNOSIS — Z20828 Contact with and (suspected) exposure to other viral communicable diseases: Secondary | ICD-10-CM | POA: Diagnosis not present

## 2023-04-08 DIAGNOSIS — R591 Generalized enlarged lymph nodes: Secondary | ICD-10-CM | POA: Diagnosis not present

## 2023-04-08 DIAGNOSIS — R6889 Other general symptoms and signs: Secondary | ICD-10-CM | POA: Diagnosis not present

## 2023-04-08 DIAGNOSIS — E6609 Other obesity due to excess calories: Secondary | ICD-10-CM | POA: Diagnosis not present

## 2023-04-08 DIAGNOSIS — R7309 Other abnormal glucose: Secondary | ICD-10-CM | POA: Diagnosis not present

## 2023-04-08 DIAGNOSIS — Z6833 Body mass index (BMI) 33.0-33.9, adult: Secondary | ICD-10-CM | POA: Diagnosis not present

## 2023-04-08 DIAGNOSIS — R0981 Nasal congestion: Secondary | ICD-10-CM | POA: Diagnosis not present

## 2023-04-08 DIAGNOSIS — E890 Postprocedural hypothyroidism: Secondary | ICD-10-CM | POA: Diagnosis not present

## 2023-04-08 DIAGNOSIS — R509 Fever, unspecified: Secondary | ICD-10-CM | POA: Diagnosis not present

## 2023-04-08 DIAGNOSIS — I10 Essential (primary) hypertension: Secondary | ICD-10-CM | POA: Diagnosis not present

## 2023-04-09 DIAGNOSIS — I2510 Atherosclerotic heart disease of native coronary artery without angina pectoris: Secondary | ICD-10-CM | POA: Diagnosis not present

## 2023-04-09 DIAGNOSIS — R918 Other nonspecific abnormal finding of lung field: Secondary | ICD-10-CM | POA: Diagnosis not present

## 2023-05-13 DIAGNOSIS — M25562 Pain in left knee: Secondary | ICD-10-CM | POA: Diagnosis not present

## 2023-05-20 DIAGNOSIS — R053 Chronic cough: Secondary | ICD-10-CM | POA: Diagnosis not present

## 2023-05-29 DIAGNOSIS — Z0001 Encounter for general adult medical examination with abnormal findings: Secondary | ICD-10-CM | POA: Diagnosis not present

## 2023-05-29 DIAGNOSIS — Z6833 Body mass index (BMI) 33.0-33.9, adult: Secondary | ICD-10-CM | POA: Diagnosis not present

## 2023-05-29 DIAGNOSIS — E782 Mixed hyperlipidemia: Secondary | ICD-10-CM | POA: Diagnosis not present

## 2023-05-29 DIAGNOSIS — E6609 Other obesity due to excess calories: Secondary | ICD-10-CM | POA: Diagnosis not present

## 2023-05-29 DIAGNOSIS — Z125 Encounter for screening for malignant neoplasm of prostate: Secondary | ICD-10-CM | POA: Diagnosis not present

## 2023-05-29 DIAGNOSIS — E890 Postprocedural hypothyroidism: Secondary | ICD-10-CM | POA: Diagnosis not present

## 2023-05-29 DIAGNOSIS — Z1331 Encounter for screening for depression: Secondary | ICD-10-CM | POA: Diagnosis not present

## 2023-07-17 DIAGNOSIS — H35033 Hypertensive retinopathy, bilateral: Secondary | ICD-10-CM | POA: Diagnosis not present

## 2023-07-17 DIAGNOSIS — H04123 Dry eye syndrome of bilateral lacrimal glands: Secondary | ICD-10-CM | POA: Diagnosis not present

## 2023-07-17 DIAGNOSIS — H25811 Combined forms of age-related cataract, right eye: Secondary | ICD-10-CM | POA: Diagnosis not present

## 2023-07-17 DIAGNOSIS — Z961 Presence of intraocular lens: Secondary | ICD-10-CM | POA: Diagnosis not present

## 2023-09-04 DIAGNOSIS — Z8589 Personal history of malignant neoplasm of other organs and systems: Secondary | ICD-10-CM | POA: Diagnosis not present

## 2023-09-04 DIAGNOSIS — L570 Actinic keratosis: Secondary | ICD-10-CM | POA: Diagnosis not present

## 2023-10-01 DIAGNOSIS — H268 Other specified cataract: Secondary | ICD-10-CM | POA: Diagnosis not present

## 2023-10-01 DIAGNOSIS — H25811 Combined forms of age-related cataract, right eye: Secondary | ICD-10-CM | POA: Diagnosis not present

## 2023-10-01 DIAGNOSIS — H2511 Age-related nuclear cataract, right eye: Secondary | ICD-10-CM | POA: Diagnosis not present

## 2023-10-08 DIAGNOSIS — K080 Exfoliation of teeth due to systemic causes: Secondary | ICD-10-CM | POA: Diagnosis not present

## 2023-10-23 DIAGNOSIS — M4316 Spondylolisthesis, lumbar region: Secondary | ICD-10-CM | POA: Diagnosis not present

## 2023-10-23 DIAGNOSIS — M48061 Spinal stenosis, lumbar region without neurogenic claudication: Secondary | ICD-10-CM | POA: Diagnosis not present

## 2023-10-23 DIAGNOSIS — Z6831 Body mass index (BMI) 31.0-31.9, adult: Secondary | ICD-10-CM | POA: Diagnosis not present

## 2023-12-04 DIAGNOSIS — L570 Actinic keratosis: Secondary | ICD-10-CM | POA: Diagnosis not present

## 2023-12-04 DIAGNOSIS — L821 Other seborrheic keratosis: Secondary | ICD-10-CM | POA: Diagnosis not present

## 2023-12-04 DIAGNOSIS — Z85828 Personal history of other malignant neoplasm of skin: Secondary | ICD-10-CM | POA: Diagnosis not present

## 2023-12-04 DIAGNOSIS — F458 Other somatoform disorders: Secondary | ICD-10-CM | POA: Diagnosis not present

## 2024-03-27 ENCOUNTER — Other Ambulatory Visit (HOSPITAL_COMMUNITY): Payer: Self-pay | Admitting: Family Medicine

## 2024-03-27 DIAGNOSIS — R911 Solitary pulmonary nodule: Secondary | ICD-10-CM

## 2024-04-01 DIAGNOSIS — E6609 Other obesity due to excess calories: Secondary | ICD-10-CM | POA: Diagnosis not present

## 2024-04-01 DIAGNOSIS — Z6832 Body mass index (BMI) 32.0-32.9, adult: Secondary | ICD-10-CM | POA: Diagnosis not present

## 2024-04-01 DIAGNOSIS — K589 Irritable bowel syndrome without diarrhea: Secondary | ICD-10-CM | POA: Diagnosis not present

## 2024-04-01 DIAGNOSIS — E890 Postprocedural hypothyroidism: Secondary | ICD-10-CM | POA: Diagnosis not present

## 2024-04-01 DIAGNOSIS — K591 Functional diarrhea: Secondary | ICD-10-CM | POA: Diagnosis not present

## 2024-04-02 ENCOUNTER — Ambulatory Visit (HOSPITAL_COMMUNITY)
Admission: RE | Admit: 2024-04-02 | Discharge: 2024-04-02 | Disposition: A | Discharge status: Home / Self Care | Source: Ambulatory Visit | Attending: Family Medicine | Admitting: Family Medicine

## 2024-04-02 DIAGNOSIS — R911 Solitary pulmonary nodule: Secondary | ICD-10-CM | POA: Diagnosis not present

## 2024-04-02 DIAGNOSIS — I700 Atherosclerosis of aorta: Secondary | ICD-10-CM | POA: Diagnosis not present

## 2024-04-02 DIAGNOSIS — R918 Other nonspecific abnormal finding of lung field: Secondary | ICD-10-CM | POA: Diagnosis not present

## 2024-04-14 DIAGNOSIS — K080 Exfoliation of teeth due to systemic causes: Secondary | ICD-10-CM | POA: Diagnosis not present

## 2024-04-21 DIAGNOSIS — M5442 Lumbago with sciatica, left side: Secondary | ICD-10-CM | POA: Diagnosis not present

## 2024-04-22 ENCOUNTER — Other Ambulatory Visit (HOSPITAL_BASED_OUTPATIENT_CLINIC_OR_DEPARTMENT_OTHER): Payer: Self-pay | Admitting: Family Medicine

## 2024-04-22 DIAGNOSIS — Z0001 Encounter for general adult medical examination with abnormal findings: Secondary | ICD-10-CM

## 2024-04-26 DIAGNOSIS — R918 Other nonspecific abnormal finding of lung field: Secondary | ICD-10-CM | POA: Insufficient documentation

## 2024-04-26 NOTE — Progress Notes (Signed)
 [facility] [address], [place] [address]   Clinic Day:  04/27/2024  Referring physician: Minus Amel, MD  Patient Care Team: Minus Amel, MD as PCP - General (Family Medicine) Riley Cheadle Windsor Hatcher, MD as Consulting Physician (Gastroenterology) Devon Fogo, MD (Inactive) as Consulting Physician (Dermatology) Dorthey Gave, PA-C as Physician Assistant (Dermatology)   ASSESSMENT & PLAN:   Assessment:  1.  Multiple lung nodules: - CT chest (04/09/2023 at [facility]): Left lower lobe lung nodules 5 and 6 mm - CT chest (04/02/2024): Pleural-based nodule/scarring in the anterior right apex at 1.4 cm, new.  Subpleural left lower lobe 1.5 cm nodule may correspond to 6 mm detailed on prior report from 2024.  Posterior left lower lobe pleural-based 1 cm nodule may correspond to 5 mm nodule detailed in 2024.  Multiple parenchymal pulmonary nodules which were not detailed on the prior and presumably new, right lower lobe 9 mm and left lower lobe 8 mm.  Total of at least 30 nodules are identified.  Gastrohepatic ligament soft tissue density could represent venous collaterals/adenopathy. - Weight loss 10-15 LB/1 year. - Colonoscopy (04/20/2021): 3 mm cecal tubular adenoma. - Multiple squamous cell carcinoma in situ and invasive cancers from the skin removed over the past several years. - 07/19/2022: Lip biopsy with invasive squamous cell carcinoma extending to the deep margin, well-differentiated - History of left thyroidectomy (01/20/2015): Benign follicular adenoma. - Intermittent diarrhea for the past 3 months, seeing Dr. Riley Cheadle later today.  He has a recent flareup of sciatica with the left leg radiation.  2.  Social/family history: - Lives at home with his wife [name].  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  No prior exposure to asbestos.  Non-smoker. - Father had colon cancer.   Sister had colon cancer.  Mother had scleroderma.  Plan:  1.  Multiple lung nodules: - I have reviewed images of the CT scan with the patient and his wife. - Recommend further workup with PET CT scan and blood work including LDH, PSA, CEA, CA 19-9, AFP and routine labs. - RTC after the PET scan.   Orders Placed This Encounter  Procedures   NM PET Image Initial (PI) Skull Base To Thigh    Standing Status:   Future    Expected Date:   05/04/2024    Expiration Date:   04/27/2025    If indicated for the ordered procedure, I authorize the administration of a radiopharmaceutical per Radiology protocol:   Yes    Preferred imaging location?:   [facility]   CBC with Differential    Standing Status:   Future    Number of Occurrences:   1    Expected Date:   04/27/2024    Expiration Date:   04/27/2025   Comprehensive metabolic panel    Standing Status:   Future    Number of Occurrences:   1    Expected Date:   04/27/2024    Expiration Date:   04/27/2025   Lactate dehydrogenase    Standing Status:   Future    Number of Occurrences:   1    Expected Date:   04/27/2024    Expiration Date:   04/27/2025   PSA    Standing Status:   Future    Number of Occurrences:   1    Expected Date:   04/27/2024  Expiration Date:   04/27/2025   CEA    Standing Status:   Future    Number of Occurrences:   1    Expected Date:   04/27/2024    Expiration Date:   04/27/2025   AFP tumor marker    Standing Status:   Future    Number of Occurrences:   1    Expected Date:   04/27/2024    Expiration Date:   04/27/2025   Cancer antigen 19-9    Standing Status:   Future    Number of Occurrences:   1    Expected Date:   04/27/2024    Expiration Date:   04/27/2025      I,Katie Daubenspeck,acting as a scribe for Paulett Boros, MD.,have documented all relevant documentation on the behalf of Paulett Boros, MD,as directed by  Paulett Boros, MD while in the presence of Paulett Boros, MD.   I,  Paulett Boros MD, have reviewed the above documentation for accuracy and completeness, and I agree with the above.   Paulett Boros, MD   4/28/20258:54 AM  CHIEF COMPLAINT/PURPOSE OF CONSULT:   Diagnosis: abnormal chest CT with multiple lung nodules   Cancer Staging  No matching staging information was found for the patient.    Prior Therapy: none  Current Therapy: Under workup   HISTORY OF PRESENT ILLNESS:   Oncology History   No history exists.      Nicholas Lewis is a 83 y.o. male presenting to clinic today for evaluation of abnormal chest CT at the request of Dr. Glady Laming.  He recently underwent chest CT on 04/02/24 showing: multiple pulmonary nodules, with pattern and morphology suspicious for metastatic disease; suspect upper abdominal portosystemic collaterals, new since remote CT from 2015; gastrohepatic ligament soft tissue density, venous collateral vs adenopathy.  Today, he states that he is doing well overall. His appetite level is at 100%. His energy level is at 75%.  PAST MEDICAL HISTORY:   Past Medical History: Past Medical History:  Diagnosis Date   Abrasion    l arm   Arthritis    Atypical nevus 10/11/1997   medial left midcalf-mild (Dr. Del Favia )   Atypical nevus 11/24/2002   right back upper- marked (exc)   Cellulitis 07/2012   after surgery for left knee   Complication of anesthesia    slow to wake up   Dog scratch 01/14/2015    office visit note from PCP on chart( to lower lip)    Gout    Hypertension    Pneumonia    hx of 2015    SCC (squamous cell carcinoma) 12/17/2012   forehead (CX35FU)   SCC (squamous cell carcinoma) 03/19/2018   right sideburn,sup (CX3FU)    SCC (squamous cell carcinoma) 03/19/2018   above right eyebrow (CX35FU)   SCC (squamous cell carcinoma) x 2 05/31/2014   right sideburn (Cx35FU),right shoulder (CX35FU)   SCCA (squamous cell carcinoma) of skin 07/05/2020   Left Mid Ear Rim (in situ)   SCCA (squamous cell  carcinoma) of skin 07/05/2020   Left Upper Crus of Antihelix (in situ)   scca in situ 11/21/2004   left sideburn (CX35FU)   Squamous cell carcinoma of skin 09/08/2020   in situ- upper left mid crus helix ear(CX35FU)   Squamous cell carcinoma of skin 02/21/2021   right preauricular area- (CX35FU)   Squamous cell carcinoma of skin 02/21/2021   left superior crus of antilhelix- (CX35FU)    Surgical History: Past Surgical History:  Procedure Laterality Date   BACK SURGERY     COLONOSCOPY  11/10/2010   RMR: internal hemorrhoids , anal papilla, otherwise normal rectum.  Long tortuous colon , polyps  in the cecum and sigmoid segment status post removeal as described above.    COLONOSCOPY N/A 01/05/2016   Dr.Rourk- normal colonoscopy (redundant colon)    COLONOSCOPY WITH PROPOFOL  N/A 04/20/2021   Procedure: COLONOSCOPY WITH PROPOFOL ;  Surgeon: Suzette Espy, MD;  Location: [facility];  Service: Endoscopy;  Laterality: N/A;  am appt   CYSTECTOMY  2003   from spine   JOINT REPLACEMENT     KNEE ARTHROSCOPY  2000   left   POLYPECTOMY  04/20/2021   Procedure: POLYPECTOMY;  Surgeon: Suzette Espy, MD;  Location: [facility];  Service: Endoscopy;;   THYROID  LOBECTOMY Left 01/20/2015   Procedure: LEFT THYROID  LOBECTOMY;  Surgeon: Oralee Billow, MD;  Location: [facility];  Service: General;  Laterality: Left;   TOTAL KNEE ARTHROPLASTY  08/06/2012   Procedure: TOTAL KNEE ARTHROPLASTY;  Surgeon: Verlinda Gloss, MD;  Location: [facility];  Service: Orthopedics;  Laterality: Left;   TOTAL KNEE ARTHROPLASTY Right 09/20/2014   Procedure: RIGHT TOTAL KNEE ARTHROPLASTY;  Surgeon: Aurther Blue, MD;  Location: [facility];  Service: Orthopedics;  Laterality: Right;    Social History: Social History   Socioeconomic History   Marital status: Married    Spouse name: Not on file   Number of children: Not on file   Years of education: Not on file   Highest education level: Not on file  Occupational History    Not on file  Tobacco Use   Smoking status: Never   Smokeless tobacco: Never  Vaping Use   Vaping status: Never Used  Substance and Sexual Activity   Alcohol use: No   Drug use: No   Sexual activity: Not on file  Other Topics Concern   Not on file  Social History Narrative   ** Merged History Encounter **       Social Drivers of Health   Financial Resource Strain: Low Risk  (08/30/2022)   Overall Financial Resource Strain (CARDIA)    Difficulty of Paying Living Expenses: Not hard at all  Food Insecurity: No Food Insecurity (04/27/2024)   Hunger Vital Sign    Worried About Running Out of Food in the Last Year: Never true    Ran Out of Food in the Last Year: Never true  Transportation Needs: No Transportation Needs (08/30/2022)   PRAPARE - Administrator, Civil Service (Medical): No    Lack of Transportation (Non-Medical): No  Physical Activity: Not on file  Stress: Not on file  Social Connections: Not on file  Intimate Partner Violence: Not At Risk (04/27/2024)   Humiliation, Afraid, Rape, and Kick questionnaire    Fear of Current or Ex-Partner: No    Emotionally Abused: No    Physically Abused: No    Sexually Abused: No    Family History: Family History  Problem Relation Age of Onset   Colon cancer Father 64    Current Medications:  Current Outpatient Medications:    acetaminophen  (TYLENOL ) 325 MG tablet, Take 650 mg by mouth every 6 (six) hours as needed., Disp: , Rfl:    ascorbic acid (VITAMIN C) 500 MG tablet, ascorbic acid (vitamin C) 500 mg tablet   500 mg every day by oral route., Disp: , Rfl:    dicyclomine (BENTYL) 10 MG capsule, Take 10  mg by mouth 4 (four) times daily., Disp: , Rfl:    diphenoxylate-atropine  (LOMOTIL) 2.5-0.025 MG tablet, Take 1 tablet by mouth 4 (four) times daily as needed., Disp: , Rfl:    Docusate Sodium  (DSS) 100 MG CAPS, Take 200 mg by mouth., Disp: , Rfl:    levothyroxine  (SYNTHROID ) 112 MCG tablet, levothyroxine  112 mcg  tablet   1 tablet every day by oral route., Disp: , Rfl:    levothyroxine  (SYNTHROID , LEVOTHROID) 112 MCG tablet, Take 112 mcg by mouth daily before breakfast. , Disp: , Rfl: 2   losartan  (COZAAR ) 50 MG tablet, Take 100 mg by mouth daily. , Disp: , Rfl:    losartan  (COZAAR ) 50 MG tablet, losartan  50 mg tablet   2 tablets every day by oral route., Disp: , Rfl:    metoprolol  tartrate (LOPRESSOR ) 25 MG tablet, Take 25 mg by mouth 2 (two) times daily., Disp: , Rfl:    Multiple Vitamin (MULTIVITAMIN WITH MINERALS) TABS tablet, Take 1 tablet by mouth daily., Disp: , Rfl:    predniSONE (DELTASONE) 5 MG tablet, Take 1 dose pk by oral route in the morning., Disp: , Rfl:    RABEprazole (ACIPHEX) 20 MG tablet, Take 20 mg by mouth daily., Disp: , Rfl:    tiZANidine (ZANAFLEX) 2 MG tablet, Take 1 tablet twice a day by oral route at bedtime for 10 days, for low back pain., Disp: , Rfl:    Allergies: Allergies  Allergen Reactions   Sulfa Antibiotics Other (See Comments)    Dizziness and vision problems   Mobic [Meloxicam] Rash   Percocet [Oxycodone -Acetaminophen ] Other (See Comments)    Very sleepy. Sensitive to all narcotics.    REVIEW OF SYSTEMS:   Review of Systems  Constitutional:  Negative for chills, fatigue and fever.  HENT:   Negative for lump/mass, mouth sores, nosebleeds, sore throat and trouble swallowing.   Eyes:  Negative for eye problems.  Respiratory:  Negative for cough and shortness of breath.   Cardiovascular:  Negative for chest pain, leg swelling and palpitations.  Gastrointestinal:  Negative for abdominal pain, constipation, diarrhea, nausea and vomiting.  Genitourinary:  Negative for bladder incontinence, difficulty urinating, dysuria, frequency, hematuria and nocturia.   Musculoskeletal:  Positive for back pain. Negative for arthralgias, flank pain, myalgias and neck pain.  Skin:  Negative for itching and rash.  Neurological:  Negative for dizziness, headaches and numbness.   Hematological:  Does not bruise/bleed easily.  Psychiatric/Behavioral:  Negative for depression, sleep disturbance and suicidal ideas. The patient is not nervous/anxious.   All other systems reviewed and are negative.    VITALS:   Blood pressure 133/73, pulse (!) 51, temperature 97.6 F (36.4 C), temperature source Oral, resp. rate 18, weight 219 lb 11.2 oz (99.7 kg), SpO2 98%.  Wt Readings from Last 3 Encounters:  04/27/24 219 lb 11.2 oz (99.7 kg)  04/20/21 225 lb (102.1 kg)  03/10/21 237 lb 9.6 oz (107.8 kg)    Body mass index is 30.64 kg/m.  Performance status (ECOG): 1 - Symptomatic but completely ambulatory  PHYSICAL EXAM:   Physical Exam Vitals and nursing note reviewed. Exam conducted with a chaperone present.  Constitutional:      Appearance: Normal appearance.  Cardiovascular:     Rate and Rhythm: Normal rate and regular rhythm.     Pulses: Normal pulses.     Heart sounds: Normal heart sounds.  Pulmonary:     Effort: Pulmonary effort is normal.     Breath sounds:  Normal breath sounds.  Abdominal:     Palpations: Abdomen is soft. There is no hepatomegaly, splenomegaly or mass.     Tenderness: There is no abdominal tenderness.  Musculoskeletal:     Right lower leg: No edema.     Left lower leg: No edema.  Lymphadenopathy:     Cervical: No cervical adenopathy.     Right cervical: No superficial, deep or posterior cervical adenopathy.    Left cervical: No superficial, deep or posterior cervical adenopathy.     Upper Body:     Right upper body: No supraclavicular or axillary adenopathy.     Left upper body: No supraclavicular or axillary adenopathy.  Neurological:     General: No focal deficit present.     Mental Status: He is alert and oriented to person, place, and time.  Psychiatric:        Mood and Affect: Mood normal.        Behavior: Behavior normal.     LABS:   CBC    Component Value Date/Time   WBC 8.9 08/06/2020 1301   RBC 4.68 08/06/2020  1301   HGB 14.2 08/06/2020 1301   HCT 42.3 08/06/2020 1301   PLT 127 (L) 08/06/2020 1301   MCV 90.4 08/06/2020 1301   MCH 30.3 08/06/2020 1301   MCHC 33.6 08/06/2020 1301   RDW 13.0 08/06/2020 1301   LYMPHSABS 1.7 08/06/2020 1301   MONOABS 0.6 08/06/2020 1301   EOSABS 0.0 08/06/2020 1301   BASOSABS 0.0 08/06/2020 1301    CMP    Component Value Date/Time   NA 139 08/06/2020 1301   K 4.2 08/06/2020 1301   CL 105 08/06/2020 1301   CO2 26 08/06/2020 1301   GLUCOSE 121 (H) 08/06/2020 1301   BUN 14 08/06/2020 1301   CREATININE 1.10 07/11/2021 1121   CALCIUM 8.1 (L) 08/06/2020 1301   PROT 6.4 (L) 08/06/2020 1301   ALBUMIN  3.4 (L) 08/06/2020 1301   AST 51 (H) 08/06/2020 1301   ALT 38 08/06/2020 1301   ALKPHOS 99 08/06/2020 1301   BILITOT 0.9 08/06/2020 1301   GFRNONAA >60 08/06/2020 1301   GFRAA >60 08/06/2020 1301     No results found for: "CEA1", "CEA" / No results found for: "CEA1", "CEA" No results found for: "PSA1" No results found for: "WGN562" No results found for: "CAN125"  No results found for: "TOTALPROTELP", "ALBUMINELP", "A1GS", "A2GS", "BETS", "BETA2SER", "GAMS", "MSPIKE", "SPEI" No results found for: "TIBC", "FERRITIN", "IRONPCTSAT" No results found for: "LDH"   STUDIES:   CT CHEST WO CONTRAST Result Date: 04/21/2024 CLINICAL DATA:  Follow-up of pulmonary nodule. * Tracking Code: BO * EXAM: CT CHEST WITHOUT CONTRAST TECHNIQUE: Multidetector CT imaging of the chest was performed following the standard protocol without IV contrast. RADIATION DOSE REDUCTION: This exam was performed according to the departmental dose-optimization program which includes automated exposure control, adjustment of the mA and/or kV according to patient size and/or use of iterative reconstruction technique. COMPARISON:  Report of an outside CT dated 04/09/2023. This describes a 5 mm left lower lobe subpleural pulmonary nodule and a 6 mm anterior left lower lobe Peri fissural pulmonary  nodule. Chest CT of 10/04/2014 also reviewed. FINDINGS: Cardiovascular: Aortic atherosclerosis. Mild cardiomegaly, without pericardial effusion. Pulmonary artery enlargement, including a right pulmonary artery 3.1 cm. Lad coronary artery calcification. Mediastinum/Nodes: No mediastinal or hilar adenopathy, given limitations of unenhanced CT. At least partial thyroidectomy. Lungs/Pleura: No pleural fluid. Pleural base nodule versus area of scarring in the anterior right  apex at 1.4 cm on 36/3, new since 2015. anterior subpleural left lower lobe 1.5 cm nodule on 100/3 may correspond to the 6 mm nodule detailed on the prior report. Posterior left lower lobe pleural based 1.0 cm nodule on 108/3 may correspond to the 5 mm nodule detailed on the prior exam report. There are multiple parenchymal pulmonary nodules which were not detailed on the prior and are presumably new. Example within the right lower lobe at 9 mm on 69/3. Left lower lobe at 8 mm on 118/3. A total of at least 30 nodules are identified. Upper Abdomen: Normal imaged portions of the upper spleen, stomach, pancreas, adrenal glands, kidneys. Suspect portosystemic collaterals within the anterior abdomen, including on 136/2, new since 2015. Soft tissue density within the gastrohepatic ligament of 1.7 x 3.0 cm on 137/2. Musculoskeletal: Lower thoracic spondylosis. IMPRESSION: 1. Multiple pulmonary nodules, with a pattern and morphology which are suspicious for metastatic disease. EMR describes a history of thyroid  cancer and the patient is status post at least partial thyroidectomy. Consider thoracic oncology consultation for eventual tissue sampling. 2. Suspect upper abdominal portosystemic collaterals, new since the remote CT of 2015. Gastrohepatic ligament soft tissue density could represent venous collateral or adenopathy, given limitation of noncontrast CT. Consider dedicated pre and post-contrast abdominopelvic CT versus further evaluation with PET. 3.  Pulmonary artery enlargement suggests pulmonary arterial hypertension. 4. Coronary artery atherosclerosis. Aortic Atherosclerosis (ICD10-I70.0). These results will be called to the ordering clinician or representative by the Radiologist Assistant, and communication documented in the PACS or [employer]. Electronically Signed   By: Lore Rode M.D.   On: 04/21/2024 14:38

## 2024-04-27 ENCOUNTER — Inpatient Hospital Stay: Attending: Hematology | Admitting: Hematology

## 2024-04-27 ENCOUNTER — Encounter: Payer: Self-pay | Admitting: Internal Medicine

## 2024-04-27 ENCOUNTER — Ambulatory Visit: Admitting: Internal Medicine

## 2024-04-27 ENCOUNTER — Inpatient Hospital Stay

## 2024-04-27 VITALS — BP 187/82 | HR 48 | Temp 97.6°F | Ht 70.0 in | Wt 220.0 lb

## 2024-04-27 VITALS — BP 133/73 | HR 51 | Temp 97.6°F | Resp 18 | Wt 219.7 lb

## 2024-04-27 DIAGNOSIS — R918 Other nonspecific abnormal finding of lung field: Secondary | ICD-10-CM | POA: Insufficient documentation

## 2024-04-27 DIAGNOSIS — R937 Abnormal findings on diagnostic imaging of other parts of musculoskeletal system: Secondary | ICD-10-CM | POA: Diagnosis not present

## 2024-04-27 DIAGNOSIS — Z800 Family history of malignant neoplasm of digestive organs: Secondary | ICD-10-CM

## 2024-04-27 DIAGNOSIS — R748 Abnormal levels of other serum enzymes: Secondary | ICD-10-CM | POA: Diagnosis not present

## 2024-04-27 DIAGNOSIS — Z860101 Personal history of adenomatous and serrated colon polyps: Secondary | ICD-10-CM | POA: Diagnosis not present

## 2024-04-27 DIAGNOSIS — Z85828 Personal history of other malignant neoplasm of skin: Secondary | ICD-10-CM | POA: Insufficient documentation

## 2024-04-27 DIAGNOSIS — R197 Diarrhea, unspecified: Secondary | ICD-10-CM

## 2024-04-27 DIAGNOSIS — R634 Abnormal weight loss: Secondary | ICD-10-CM | POA: Insufficient documentation

## 2024-04-27 LAB — COMPREHENSIVE METABOLIC PANEL WITH GFR
ALT: 24 U/L (ref 0–44)
AST: 22 U/L (ref 15–41)
Albumin: 3.4 g/dL — ABNORMAL LOW (ref 3.5–5.0)
Alkaline Phosphatase: 220 U/L — ABNORMAL HIGH (ref 38–126)
Anion gap: 8 (ref 5–15)
BUN: 21 mg/dL (ref 8–23)
CO2: 28 mmol/L (ref 22–32)
Calcium: 8.9 mg/dL (ref 8.9–10.3)
Chloride: 105 mmol/L (ref 98–111)
Creatinine, Ser: 1.02 mg/dL (ref 0.61–1.24)
GFR, Estimated: >60 mL/min (ref >60)
Glucose, Bld: 149 mg/dL — ABNORMAL HIGH (ref 70–99)
Potassium: 3.6 mmol/L (ref 3.5–5.1)
Sodium: 141 mmol/L (ref 135–145)
Total Bilirubin: 0.8 mg/dL (ref 0.0–1.2)
Total Protein: 6.3 g/dL — ABNORMAL LOW (ref 6.5–8.1)

## 2024-04-27 LAB — CBC WITH DIFFERENTIAL/PLATELET
Abs Immature Granulocytes: 0.04 10*3/uL (ref 0.00–0.07)
Basophils Absolute: 0 10*3/uL (ref 0.0–0.1)
Basophils Relative: 0 %
Eosinophils Absolute: 0.1 10*3/uL (ref 0.0–0.5)
Eosinophils Relative: 1 %
HCT: 39.9 % (ref 39.0–52.0)
Hemoglobin: 13.7 g/dL (ref 13.0–17.0)
Immature Granulocytes: 1 %
Lymphocytes Relative: 31 %
Lymphs Abs: 2.1 10*3/uL (ref 0.7–4.0)
MCH: 31 pg (ref 26.0–34.0)
MCHC: 34.3 g/dL (ref 30.0–36.0)
MCV: 90.3 fL (ref 80.0–100.0)
Monocytes Absolute: 0.5 10*3/uL (ref 0.1–1.0)
Monocytes Relative: 8 %
Neutro Abs: 3.9 10*3/uL (ref 1.7–7.7)
Neutrophils Relative %: 59 %
Platelets: 141 10*3/uL — ABNORMAL LOW (ref 150–400)
RBC: 4.42 MIL/uL (ref 4.22–5.81)
RDW: 13 % (ref 11.5–15.5)
WBC: 6.7 10*3/uL (ref 4.0–10.5)
nRBC: 0 % (ref 0.0–0.2)

## 2024-04-27 LAB — LACTATE DEHYDROGENASE: LDH: 141 U/L (ref 98–192)

## 2024-04-27 LAB — PSA: Prostatic Specific Antigen: 4.95 ng/mL — ABNORMAL HIGH (ref 0.00–4.00)

## 2024-04-27 NOTE — Patient Instructions (Addendum)
 You were seen and examined today by Dr. Cheree Cords. Dr. Katragadda is a medical oncologist, meaning that he specializes in the treatment of cancer diagnoses. Dr. Cheree Cords discussed your past medical history, family history of cancers, and the events that led to you being here today.  The recent CT you had is showing the spots in your left lung have grown in size since your last CT.   We will arrange for you to have a PET scan to investigate this further.   We will check lab work today.  We will see you back after the PET to discuss the results of the lab and scan.

## 2024-04-27 NOTE — Patient Instructions (Addendum)
 It was good to see you again today!  Further evaluation as to the cause of your diarrhea needed  We will start with stool studies, GIP and C. Difficile  CRP with stool labs  May use dicyclomine 10 mg every morning to prevent diarrhea episodes.  Try not to use any more than that and do not use Lomotil.  The Aciphex or rabeprazole is probably not making much difference 1 way or to the other  Because one of your liver enzymes alkaline phosphatase was up a little bit, we will simply repeat your liver profile in 6 weeks  Telephone follow-up in the near future

## 2024-04-27 NOTE — Progress Notes (Unsigned)
 Primary Care Physician:  Minus Amel, MD Primary Gastroenterologist:  Dr. Riley Cheadle  Pre-Procedure History & Physical: HPI:  Nicholas Lewis is a 83 y.o. male here for 4 month history of altered bowel function patient had what he describes as normal bowel function until 3 to 4 months ago when he started having episodes of urgent explosive watery diarrhea punctuated by a day or 2 of fairly normal bowel function.  No bleeding.  No associated abdominal pain no fever.  No recent travel no sick contacts no new medications no antibiotics.  Has not had any stool studies as of yet he was prescribed dicyclomine and Lomotil along with rabeprazole recently.  Has taken rabeprazole only cannot tell any difference in his symptoms.  Colonoscopy back in 2022 yielded a small adenoma.  Positive family history colon cancer slated for another colonoscopy in 2028.  Over the past 3 years he has cut back and developed more high-quality eating habits and has lost 17 pounds over the past 3 years.  He denies any change in dietary habits appears not to have any trouble with dairy no excess sugar-free products, etc.  He is seeing oncology for pulmonary nodules which have enlarged slightly recently.  Labs from today show elevated alkaline phosphatase of 220 normal bilirubin and aminotransferases.  3 years ago his alkaline phosphatase was normal at 99.  Past Medical History:  Diagnosis Date   Abrasion    l arm   Arthritis    Atypical nevus 10/11/1997   medial left midcalf-mild (Dr. Del Favia )   Atypical nevus 11/24/2002   right back upper- marked (exc)   Cellulitis 07/2012   after surgery for left knee   Complication of anesthesia    slow to wake up   Dog scratch 01/14/2015    office visit note from PCP on chart( to lower lip)    Gout    Hypertension    Pneumonia    hx of 2015    SCC (squamous cell carcinoma) 12/17/2012   forehead (CX35FU)   SCC (squamous cell carcinoma) 03/19/2018   right sideburn,sup (CX3FU)     SCC (squamous cell carcinoma) 03/19/2018   above right eyebrow (CX35FU)   SCC (squamous cell carcinoma) x 2 05/31/2014   right sideburn (Cx35FU),right shoulder (CX35FU)   SCCA (squamous cell carcinoma) of skin 07/05/2020   Left Mid Ear Rim (in situ)   SCCA (squamous cell carcinoma) of skin 07/05/2020   Left Upper Crus of Antihelix (in situ)   scca in situ 11/21/2004   left sideburn (CX35FU)   Squamous cell carcinoma of skin 09/08/2020   in situ- upper left mid crus helix ear(CX35FU)   Squamous cell carcinoma of skin 02/21/2021   right preauricular area- (CX35FU)   Squamous cell carcinoma of skin 02/21/2021   left superior crus of antilhelix- (CX35FU)    Past Surgical History:  Procedure Laterality Date   BACK SURGERY     COLONOSCOPY  11/10/2010   RMR: internal hemorrhoids , anal papilla, otherwise normal rectum.  Long tortuous colon , polyps  in the cecum and sigmoid segment status post removeal as described above.    COLONOSCOPY N/A 01/05/2016   Dr.[place]- normal colonoscopy (redundant colon)    COLONOSCOPY WITH PROPOFOL  N/A 04/20/2021   Procedure: COLONOSCOPY WITH PROPOFOL ;  Surgeon: Suzette Espy, MD;  Location: [facility];  Service: Endoscopy;  Laterality: N/A;  am appt   CYSTECTOMY  2003   from spine   JOINT REPLACEMENT  KNEE ARTHROSCOPY  2000   left   POLYPECTOMY  04/20/2021   Procedure: POLYPECTOMY;  Surgeon: Suzette Espy, MD;  Location: [facility];  Service: Endoscopy;;   THYROID  LOBECTOMY Left 01/20/2015   Procedure: LEFT THYROID  LOBECTOMY;  Surgeon: Oralee Billow, MD;  Location: [facility];  Service: General;  Laterality: Left;   TOTAL KNEE ARTHROPLASTY  08/06/2012   Procedure: TOTAL KNEE ARTHROPLASTY;  Surgeon: Verlinda Gloss, MD;  Location: [facility];  Service: Orthopedics;  Laterality: Left;   TOTAL KNEE ARTHROPLASTY Right 09/20/2014   Procedure: RIGHT TOTAL KNEE ARTHROPLASTY;  Surgeon: Aurther Blue, MD;  Location: [facility];  Service: Orthopedics;  Laterality:  Right;    Prior to Admission medications   Medication Sig Start Date End Date Taking? Authorizing Provider  acetaminophen  (TYLENOL ) 325 MG tablet Take 650 mg by mouth every 6 (six) hours as needed.   Yes [provider]  ascorbic acid (VITAMIN C) 500 MG tablet ascorbic acid (vitamin C) 500 mg tablet   500 mg every day by oral route. 12/31/18  Yes [provider]  dicyclomine (BENTYL) 10 MG capsule Take 10 mg by mouth 4 (four) times daily. 04/01/24  Yes [provider]  diphenoxylate-atropine  (LOMOTIL) 2.5-0.025 MG tablet Take 1 tablet by mouth 4 (four) times daily as needed. 04/01/24  Yes [provider]  Docusate Sodium  (DSS) 100 MG CAPS Take 200 mg by mouth. 07/19/22  Yes [provider]  levothyroxine  (SYNTHROID ) 112 MCG tablet levothyroxine  112 mcg tablet   1 tablet every day by oral route. 10/01/19  Yes [provider]  levothyroxine  (SYNTHROID , LEVOTHROID) 112 MCG tablet Take 112 mcg by mouth daily before breakfast.  01/18/17  Yes [provider]  losartan  (COZAAR ) 50 MG tablet Take 100 mg by mouth daily.  03/21/19  Yes [provider]  losartan  (COZAAR ) 50 MG tablet losartan  50 mg tablet   2 tablets every day by oral route. 12/31/17  Yes [provider]  metoprolol  tartrate (LOPRESSOR ) 25 MG tablet Take 25 mg by mouth 2 (two) times daily.   Yes [provider]  Multiple Vitamin (MULTIVITAMIN WITH MINERALS) TABS tablet Take 1 tablet by mouth daily.   Yes [provider]  predniSONE (DELTASONE) 5 MG tablet Take 1 dose pk by oral route in the morning. 04/21/24  Yes [provider]  RABEprazole (ACIPHEX) 20 MG tablet Take 20 mg by mouth daily. 04/01/24  Yes [provider]  tiZANidine (ZANAFLEX) 2 MG tablet Take 1 tablet twice a day by oral route at bedtime for 10 days, for low back pain. 04/21/24  Yes [provider]    Allergies as of 04/27/2024 - Review Complete 04/27/2024   Allergen Reaction Noted   Sulfa antibiotics Other (See Comments) 07/22/2012   Mobic [meloxicam] Rash 07/23/2012   Percocet [oxycodone -acetaminophen ] Other (See Comments) 09/20/2014    Family History  Problem Relation Age of Onset   Colon cancer Father 31    Social History   Socioeconomic History   Marital status: Married    Spouse name: Not on file   Number of children: Not on file   Years of education: Not on file   Highest education level: Not on file  Occupational History   Not on file  Tobacco Use   Smoking status: Never   Smokeless tobacco: Never  Vaping Use   Vaping status: Never Used  Substance and Sexual Activity   Alcohol use: No   Drug use: No  Sexual activity: Not on file  Other Topics Concern   Not on file  Social History Narrative   ** Merged History Encounter **       Social Drivers of Health   Financial Resource Strain: Low Risk  (08/30/2022)   Overall Financial Resource Strain (CARDIA)    Difficulty of Paying Living Expenses: Not hard at all  Food Insecurity: No Food Insecurity (04/27/2024)   Hunger Vital Sign    Worried About Running Out of Food in the Last Year: Never true    Ran Out of Food in the Last Year: Never true  Transportation Needs: No Transportation Needs (08/30/2022)   PRAPARE - Administrator, Civil Service (Medical): No    Lack of Transportation (Non-Medical): No  Physical Activity: Not on file  Stress: Not on file  Social Connections: Not on file  Intimate Partner Violence: Not At Risk (04/27/2024)   Humiliation, Afraid, Rape, and Kick questionnaire    Fear of Current or Ex-Partner: No    Emotionally Abused: No    Physically Abused: No    Sexually Abused: No    Review of Systems: See HPI, otherwise negative ROS  Physical Exam: BP (!) 187/82 (BP Location: Right Arm, Patient Position: Sitting, Cuff Size: Large)   Pulse (!) 48   Temp 97.6 F (36.4 C) (Oral)   Ht 5\' 10"  (1.778 m)   Wt 220 lb (99.8 kg)   SpO2  97%   BMI 31.57 kg/m  General:   Alert,  Well-developed, well-nourished, pleasant and cooperative in NAD nontoxic-appearing. Abdomen: Non-distended, normal bowel sounds.  Soft and nontender without appreciable mass or hepatosplenomegaly.   Impression/Plan: 83 year old gentleman who remains very active now with a 51-month history of altered bowel function.  Frequent bouts of nonbloody watery diarrhea with occasional normal bowel function interspersed.  This is a marked contrast to his lifelong normal bowel function.  The fact that he has normal bowel function occasionally somewhat mitigates against inflammatory process.  However, an infectious laboratory process needs to be kept in the differential.  I would be hesitant to make a diagnosis of late onset irritable bowel syndrome in this very nice 83 year old gentleman.  History of colonic adenoma and a positive family history of colon cancer; due for surveillance examination in 2 years so long as  his overall health permits  Recommendations: We will start with stool studies, GIP and C. Difficile  CRP with stool labs  May use dicyclomine 10 mg every morning to prevent diarrhea episodes.  Try not to use any more than that and do not use Lomotil.  Minimize use of dairy products the time being.  Elevated  alkaline phosphatase today with normal bilirubin aminotransferases-nonspecific; will simply repeat in 6 weeks to see if elevation is sustained.  Telephone follow-up in the near future    Notice: This dictation was prepared with Dragon dictation along with smaller phrase technology. Any transcriptional errors that result from this process are unintentional and may not be corrected upon review.

## 2024-04-28 LAB — CEA: CEA: 4.5 ng/mL (ref 0.0–4.7)

## 2024-04-28 LAB — CANCER ANTIGEN 19-9: CA 19-9: 597 U/mL — ABNORMAL HIGH (ref 0–35)

## 2024-04-28 LAB — AFP TUMOR MARKER: AFP, Serum, Tumor Marker: 5 ng/mL (ref 0.0–6.4)

## 2024-04-29 DIAGNOSIS — R197 Diarrhea, unspecified: Secondary | ICD-10-CM | POA: Diagnosis not present

## 2024-04-30 LAB — GI PROFILE, STOOL, PCR

## 2024-04-30 LAB — C-REACTIVE PROTEIN: CRP: 4 mg/L (ref 0–10)

## 2024-05-01 ENCOUNTER — Other Ambulatory Visit: Payer: Self-pay

## 2024-05-01 DIAGNOSIS — M5432 Sciatica, left side: Secondary | ICD-10-CM | POA: Diagnosis not present

## 2024-05-01 DIAGNOSIS — R7989 Other specified abnormal findings of blood chemistry: Secondary | ICD-10-CM

## 2024-05-01 DIAGNOSIS — Z6831 Body mass index (BMI) 31.0-31.9, adult: Secondary | ICD-10-CM | POA: Diagnosis not present

## 2024-05-01 DIAGNOSIS — E6609 Other obesity due to excess calories: Secondary | ICD-10-CM | POA: Diagnosis not present

## 2024-05-01 DIAGNOSIS — R197 Diarrhea, unspecified: Secondary | ICD-10-CM

## 2024-05-03 LAB — C DIFFICILE, CYTOTOXIN B

## 2024-05-03 LAB — C DIFFICILE TOXINS A+B W/RFLX: C difficile Toxins A+B, EIA: NEGATIVE

## 2024-05-06 DIAGNOSIS — M5442 Lumbago with sciatica, left side: Secondary | ICD-10-CM | POA: Diagnosis not present

## 2024-05-07 ENCOUNTER — Ambulatory Visit (HOSPITAL_COMMUNITY)
Admission: RE | Admit: 2024-05-07 | Discharge: 2024-05-07 | Disposition: A | Discharge status: Home / Self Care | Source: Ambulatory Visit | Attending: Hematology | Admitting: Hematology

## 2024-05-07 DIAGNOSIS — R918 Other nonspecific abnormal finding of lung field: Secondary | ICD-10-CM | POA: Insufficient documentation

## 2024-05-11 DIAGNOSIS — M5442 Lumbago with sciatica, left side: Secondary | ICD-10-CM | POA: Diagnosis not present

## 2024-05-13 ENCOUNTER — Inpatient Hospital Stay: Admitting: Hematology

## 2024-05-13 DIAGNOSIS — M5416 Radiculopathy, lumbar region: Secondary | ICD-10-CM | POA: Diagnosis not present

## 2024-05-13 DIAGNOSIS — Z6829 Body mass index (BMI) 29.0-29.9, adult: Secondary | ICD-10-CM | POA: Diagnosis not present

## 2024-05-14 ENCOUNTER — Other Ambulatory Visit (HOSPITAL_COMMUNITY): Payer: Self-pay | Admitting: Neurological Surgery

## 2024-05-14 ENCOUNTER — Ambulatory Visit (HOSPITAL_COMMUNITY)
Admission: RE | Admit: 2024-05-14 | Discharge: 2024-05-14 | Disposition: A | Discharge status: Home / Self Care | Source: Ambulatory Visit | Attending: Neurological Surgery | Admitting: Neurological Surgery

## 2024-05-14 ENCOUNTER — Encounter (HOSPITAL_COMMUNITY)
Admission: RE | Admit: 2024-05-14 | Discharge: 2024-05-14 | Disposition: A | Discharge status: Home / Self Care | Source: Ambulatory Visit | Attending: Hematology | Admitting: Hematology

## 2024-05-14 DIAGNOSIS — M5416 Radiculopathy, lumbar region: Secondary | ICD-10-CM

## 2024-05-14 DIAGNOSIS — M4316 Spondylolisthesis, lumbar region: Secondary | ICD-10-CM | POA: Diagnosis not present

## 2024-05-14 DIAGNOSIS — M5115 Intervertebral disc disorders with radiculopathy, thoracolumbar region: Secondary | ICD-10-CM | POA: Diagnosis not present

## 2024-05-14 DIAGNOSIS — M5116 Intervertebral disc disorders with radiculopathy, lumbar region: Secondary | ICD-10-CM | POA: Diagnosis not present

## 2024-05-14 DIAGNOSIS — R918 Other nonspecific abnormal finding of lung field: Secondary | ICD-10-CM | POA: Diagnosis not present

## 2024-05-14 DIAGNOSIS — R911 Solitary pulmonary nodule: Secondary | ICD-10-CM | POA: Diagnosis not present

## 2024-05-14 DIAGNOSIS — M48061 Spinal stenosis, lumbar region without neurogenic claudication: Secondary | ICD-10-CM | POA: Diagnosis not present

## 2024-05-14 MED ORDER — GADOBUTROL 1 MMOL/ML IV SOLN
8.0000 mL | Freq: Once | INTRAVENOUS | Status: AC | PRN
  Administered 2024-05-14: 8 mL via INTRAVENOUS

## 2024-05-14 MED ORDER — FLUDEOXYGLUCOSE F - 18 (FDG) INJECTION
11.2600 | Freq: Once | INTRAVENOUS | Status: AC | PRN
  Administered 2024-05-14: 11.26 via INTRAVENOUS

## 2024-05-15 DIAGNOSIS — M5442 Lumbago with sciatica, left side: Secondary | ICD-10-CM | POA: Diagnosis not present

## 2024-05-18 DIAGNOSIS — M5442 Lumbago with sciatica, left side: Secondary | ICD-10-CM | POA: Diagnosis not present

## 2024-05-19 ENCOUNTER — Other Ambulatory Visit: Payer: Self-pay

## 2024-05-19 ENCOUNTER — Ambulatory Visit (HOSPITAL_COMMUNITY)
Admission: RE | Admit: 2024-05-19 | Discharge: 2024-05-19 | Disposition: A | Discharge status: Home / Self Care | Source: Ambulatory Visit | Attending: Family Medicine | Admitting: Family Medicine

## 2024-05-19 DIAGNOSIS — R7989 Other specified abnormal findings of blood chemistry: Secondary | ICD-10-CM

## 2024-05-19 DIAGNOSIS — R197 Diarrhea, unspecified: Secondary | ICD-10-CM

## 2024-05-19 DIAGNOSIS — Z0001 Encounter for general adult medical examination with abnormal findings: Secondary | ICD-10-CM | POA: Insufficient documentation

## 2024-05-20 ENCOUNTER — Inpatient Hospital Stay: Attending: Hematology | Admitting: Hematology

## 2024-05-20 ENCOUNTER — Inpatient Hospital Stay

## 2024-05-20 VITALS — BP 159/77 | HR 55 | Temp 98.2°F | Resp 20 | Wt 206.1 lb

## 2024-05-20 DIAGNOSIS — Z85828 Personal history of other malignant neoplasm of skin: Secondary | ICD-10-CM | POA: Insufficient documentation

## 2024-05-20 DIAGNOSIS — Z800 Family history of malignant neoplasm of digestive organs: Secondary | ICD-10-CM | POA: Insufficient documentation

## 2024-05-20 DIAGNOSIS — M899 Disorder of bone, unspecified: Secondary | ICD-10-CM | POA: Insufficient documentation

## 2024-05-20 DIAGNOSIS — K869 Disease of pancreas, unspecified: Secondary | ICD-10-CM | POA: Diagnosis not present

## 2024-05-20 DIAGNOSIS — R918 Other nonspecific abnormal finding of lung field: Secondary | ICD-10-CM | POA: Insufficient documentation

## 2024-05-20 DIAGNOSIS — K8689 Other specified diseases of pancreas: Secondary | ICD-10-CM

## 2024-05-20 DIAGNOSIS — R739 Hyperglycemia, unspecified: Secondary | ICD-10-CM | POA: Insufficient documentation

## 2024-05-20 DIAGNOSIS — M545 Low back pain, unspecified: Secondary | ICD-10-CM | POA: Diagnosis not present

## 2024-05-20 DIAGNOSIS — C259 Malignant neoplasm of pancreas, unspecified: Secondary | ICD-10-CM | POA: Diagnosis not present

## 2024-05-20 LAB — HEMOGLOBIN A1C
Hgb A1c MFr Bld: 5.5 % (ref 4.8–5.6)
Mean Plasma Glucose: 111.15 mg/dL

## 2024-05-20 LAB — GENETIC SCREENING ORDER

## 2024-05-20 NOTE — Patient Instructions (Addendum)
 [facility] at [facility] Discharge Instructions   You were seen and examined today by Dr. Cheree Cords.  He reviewed the results of your lab work. It is showing a tumor marker called CA 19-9 is elevated. This is commonly elevated in pancreatic cancer.  He reviewed the results of your PET scan. It is showing multiple areas lighting up, suspicious for cancer. There are areas in the sternum, spine, and a pancreatic mass lighting up.   We will arrange for you to have a biopsy of the lesion on your lumbar spine to confirm the diagnosis. We will send special testing on this biopsy to look for any mutations to target for treatment. We will also do blood work today - genetic testing and blood test that is a liquid (blood) biopsy.   Return as scheduled.    Thank you for choosing [facility] at [facility] to provide your oncology and hematology care.  To afford each patient quality time with our provider, please arrive at least 15 minutes before your scheduled appointment time.   If you have a lab appointment with the Cancer Center please come in thru the Main Entrance and check in at the main information desk.  You need to re-schedule your appointment should you arrive 10 or more minutes late.  We strive to give you quality time with our providers, and arriving late affects you and other patients whose appointments are after yours.  Also, if you no show three or more times for appointments you may be dismissed from the clinic at the providers discretion.     Again, thank you for choosing [facility].  Our hope is that these requests will decrease the amount of time that you wait before being seen by our physicians.       _____________________________________________________________  Should you have questions after your visit to [facility], please contact our office at [phone] and follow the prompts.  Our office hours are  8:00 a.m. and 4:30 p.m. Monday - Friday.  Please note that voicemails left after 4:00 p.m. may not be returned until the following business day.  We are closed weekends and major holidays.  You do have access to a nurse 24-7, just call the main number to the clinic [phone] and do not press any options, hold on the line and a nurse will answer the phone.    For prescription refill requests, have your pharmacy contact our office and allow 72 hours.    Due to Covid, you will need to wear a mask upon entering the hospital. If you do not have a mask, a mask will be given to you at the Main Entrance upon arrival. For doctor visits, patients may have 1 support person age 1 or older with them. For treatment visits, patients can not have anyone with them due to social distancing guidelines and our immunocompromised population.

## 2024-05-20 NOTE — Progress Notes (Signed)
 [facility] [address] [place] [address]   Clinic Day:  05/20/2024  Referring physician: Minus Amel, MD  Patient Care Team: Minus Amel, MD as PCP - General (Family Medicine) Riley Cheadle Windsor Hatcher, MD as Consulting Physician (Gastroenterology) Devon Fogo, MD (Inactive) as Consulting Physician (Dermatology) Dorthey Gave, PA-C as Physician Assistant (Dermatology)   ASSESSMENT & PLAN:   Assessment:  1.  Multiple lung nodules: - CT chest (04/09/2023 at [facility]): Left lower lobe lung nodules 5 and 6 mm - CT chest (04/02/2024): Pleural-based nodule/scarring in the anterior right apex at 1.4 cm, new.  Subpleural left lower lobe 1.5 cm nodule may correspond to 6 mm detailed on prior report from 2024.  Posterior left lower lobe pleural-based 1 cm nodule may correspond to 5 mm nodule detailed in 2024.  Multiple parenchymal pulmonary nodules which were not detailed on the prior and presumably new, right lower lobe 9 mm and left lower lobe 8 mm.  Total of at least 30 nodules are identified.  Gastrohepatic ligament soft tissue density could represent venous collaterals/adenopathy. - Weight loss 10-15 LB/1 year. - Colonoscopy (04/20/2021): 3 mm cecal tubular adenoma. - Multiple squamous cell carcinoma in situ and invasive cancers from the skin removed over the past several years. - 07/19/2022: Lip biopsy with invasive squamous cell carcinoma extending to the deep margin, well-differentiated - History of left thyroidectomy (01/20/2015): Benign follicular adenoma. - Intermittent diarrhea for the past 3 months, seeing Dr. Riley Cheadle later today.  He has a recent flareup of sciatica with the left leg radiation.  2.  Social/family history: - Lives at home with his wife Nicholas Lewis.  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  No prior exposure to asbestos.  Non-smoker. - Father had colon cancer.   Sister had colon cancer.  Mother had scleroderma.  Plan:  1.  Pancreatic tail mass with bone lesions: - We reviewed labs from 04/27/2024.  Alk phos is elevated at 220.  CBC grossly normal with mild thrombocytopenia of 141.  aFP and CEA were normal.  CA 19-9 was elevated at 597.  PSA mildly elevated at 4.95. - Reviewed PET scan from 05/14/2024: Focal area of abnormal uptake in the tail of the pancreas with SUV 4.5 corresponding to a low-attenuation lesion measuring 2.7 cm.  Abnormal lymph node in the left upper abdomen 3.4 cm with no uptake.  Few other prominent but not pathologically enlarged nodes in the periceliac, periaortic and central mesentery.  Spleen is enlarged measuring 13.4 cm.  Lucent lesion in the sternum with SUV 6.6.  L3 vertebral body lesion.  Multiple small lung nodules are not hypermetabolic. - Clinically suspicious for metastatic pancreatic cancer. - Recommend CT abdomen pancreatic protocol.  Will ask IR to do biopsy of the L3 vertebral body. - Will send Guardant360.  Will also send NGS testing on the biopsy.  Also recommend genetic testing. - He reports elevated fasting blood glucose levels.  Will also check hemoglobin A1c. - RTC 1 week after the biopsy to discuss results.  2.  Left low back pain: - Also reviewed MRI of the lumbar spine from 05/14/2024: Enhancing mass lesion along the posteroinferior left side of the L3 vertebral body measuring 2.6 x 2.7 x 1.2 cm.  Extraosseous enhancing soft tissue extends into the left L3-4 foramen. - After the biopsy, will consider radiation oncology evaluation for pain control.  Orders Placed This Encounter  Procedures   CT Biopsy    Standing Status:   Future    Expiration Date:   05/20/2025    Lab orders requested (DO NOT place separate lab orders, these will be automatically ordered during procedure specimen collection)::   Surgical Pathology    Reason for Exam (SYMPTOM  OR DIAGNOSIS REQUIRED):   biopsy L3 vertebral lesion    Preferred  location?:   [facility]   CT ABDOMEN W WO CONTRAST    PANCREATIC PROTOCOL    Standing Status:   Future    Expected Date:   05/27/2024    Expiration Date:   05/20/2025    If indicated for the ordered procedure, I authorize the administration of contrast media per Radiology protocol:   Yes    Does the patient have a contrast media/X-ray dye allergy?:   No    Preferred imaging location?:   [facility]    If indicated for the ordered procedure, I authorize the administration of oral contrast media per Radiology protocol:   Yes   Genetic Screening Order    Standing Status:   Future    Number of Occurrences:   1    Expected Date:   05/20/2024    Expiration Date:   05/20/2025   Hemoglobin A1c    Standing Status:   Future    Number of Occurrences:   1    Expected Date:   05/20/2024    Expiration Date:   05/20/2025   Total time spent is 40 minutes reviewing images, discussing further workup.   Nadeen Augusta Teague,acting as a Neurosurgeon for Paulett Boros, MD.,have documented all relevant documentation on the behalf of Paulett Boros, MD,as directed by  Paulett Boros, MD while in the presence of Paulett Boros, MD.  I, Paulett Boros MD, have reviewed the above documentation for accuracy and completeness, and I agree with the above.    Paulett Boros, MD   5/21/20259:38 AM  CHIEF COMPLAINT/PURPOSE OF CONSULT:   Diagnosis: abnormal chest CT with multiple lung nodules   Cancer Staging  No matching staging information was found for the patient.    Prior Therapy: none  Current Therapy: Under workup   HISTORY OF PRESENT ILLNESS:   Oncology History   No history exists.      Nicholas Lewis is a 83 y.o. male presenting to clinic today for evaluation of abnormal chest CT at the request of Dr. Glady Laming.  He recently underwent chest CT on 04/02/24 showing: multiple pulmonary nodules, with pattern and morphology suspicious for metastatic disease; suspect  upper abdominal portosystemic collaterals, new since remote CT from 2015; gastrohepatic ligament soft tissue density, venous collateral vs adenopathy.  Today, he states that he is doing well overall. His appetite level is at 100%. His energy level is at 75%.  INTERVAL HISTORY:   Nicholas Lewis is a 83 y.o. male presenting to the clinic today for follow-up of multiple lung nodules. He was last seen by me on 04/27/24 in consultation.  Since his last visit, he underwent initial PET on 05/14/24 that found: Focal mass lesion along the tail the pancreas with abnormal uptake. Worrisome for a pancreatic neoplasm. There is an abnormal lymph node identified in the left upper abdomen. However this does not show abnormal uptake. This is still suspicious based on the size of the lymph node. There is some others small but prominent upper retroperitoneal nodes identified as well which are not hypermetabolic including periceliac, portacaval  and periaortic. Hypermetabolic lesions identified along the lower sternum and at the L4 vertebral body. Bony metastatic lesions are possible. Numerous small lung nodules are identified these are unchanged from the recent prior and are not hypermetabolic. In light of the other findings these are suspicious.  Today, he states that he is doing well overall. His appetite level is at 25%. His energy level is at 25%.   PAST MEDICAL HISTORY:   Past Medical History: Past Medical History:  Diagnosis Date   Abrasion    l arm   Arthritis    Atypical nevus 10/11/1997   medial left midcalf-mild (Dr. Del Favia )   Atypical nevus 11/24/2002   right back upper- marked (exc)   Cellulitis 07/2012   after surgery for left knee   Complication of anesthesia    slow to wake up   Dog scratch 01/14/2015    office visit note from PCP on chart( to lower lip)    Gout    Hypertension    Pneumonia    hx of 2015    SCC (squamous cell carcinoma) 12/17/2012   forehead (CX35FU)   SCC (squamous cell  carcinoma) 03/19/2018   right sideburn,sup (CX3FU)    SCC (squamous cell carcinoma) 03/19/2018   above right eyebrow (CX35FU)   SCC (squamous cell carcinoma) x 2 05/31/2014   right sideburn (Cx35FU),right shoulder (CX35FU)   SCCA (squamous cell carcinoma) of skin 07/05/2020   Left Mid Ear Rim (in situ)   SCCA (squamous cell carcinoma) of skin 07/05/2020   Left Upper Crus of Antihelix (in situ)   scca in situ 11/21/2004   left sideburn (CX35FU)   Squamous cell carcinoma of skin 09/08/2020   in situ- upper left mid crus helix ear(CX35FU)   Squamous cell carcinoma of skin 02/21/2021   right preauricular area- (CX35FU)   Squamous cell carcinoma of skin 02/21/2021   left superior crus of antilhelix- (CX35FU)    Surgical History: Past Surgical History:  Procedure Laterality Date   BACK SURGERY     COLONOSCOPY  11/10/2010   RMR: internal hemorrhoids , anal papilla, otherwise normal rectum.  Long tortuous colon , polyps  in the cecum and sigmoid segment status post removeal as described above.    COLONOSCOPY N/A 01/05/2016   Dr.Rourk- normal colonoscopy (redundant colon)    COLONOSCOPY WITH PROPOFOL  N/A 04/20/2021   Procedure: COLONOSCOPY WITH PROPOFOL ;  Surgeon: Suzette Espy, MD;  Location: [facility];  Service: Endoscopy;  Laterality: N/A;  am appt   CYSTECTOMY  2003   from spine   JOINT REPLACEMENT     KNEE ARTHROSCOPY  2000   left   POLYPECTOMY  04/20/2021   Procedure: POLYPECTOMY;  Surgeon: Suzette Espy, MD;  Location: [facility];  Service: Endoscopy;;   THYROID  LOBECTOMY Left 01/20/2015   Procedure: LEFT THYROID  LOBECTOMY;  Surgeon: Oralee Billow, MD;  Location: [facility];  Service: General;  Laterality: Left;   TOTAL KNEE ARTHROPLASTY  08/06/2012   Procedure: TOTAL KNEE ARTHROPLASTY;  Surgeon: Verlinda Gloss, MD;  Location: [facility];  Service: Orthopedics;  Laterality: Left;   TOTAL KNEE ARTHROPLASTY Right 09/20/2014   Procedure: RIGHT TOTAL KNEE ARTHROPLASTY;  Surgeon: Aurther Blue, MD;  Location: [facility];  Service: Orthopedics;  Laterality: Right;    Social History: Social History   Socioeconomic History   Marital status: Married    Spouse name: Not on file   Number of children: Not on file   Years of education:  Not on file   Highest education level: Not on file  Occupational History   Not on file  Tobacco Use   Smoking status: Never   Smokeless tobacco: Never  Vaping Use   Vaping status: Never Used  Substance and Sexual Activity   Alcohol use: No   Drug use: No   Sexual activity: Not on file  Other Topics Concern   Not on file  Social History Narrative   ** Merged History Encounter **       Social Drivers of Health   Financial Resource Strain: Low Risk  (08/30/2022)   Overall Financial Resource Strain (CARDIA)    Difficulty of Paying Living Expenses: Not hard at all  Food Insecurity: No Food Insecurity (04/27/2024)   Hunger Vital Sign    Worried About Running Out of Food in the Last Year: Never true    Ran Out of Food in the Last Year: Never true  Transportation Needs: No Transportation Needs (08/30/2022)   PRAPARE - Administrator, Civil Service (Medical): No    Lack of Transportation (Non-Medical): No  Physical Activity: Not on file  Stress: Not on file  Social Connections: Not on file  Intimate Partner Violence: Not At Risk (04/27/2024)   Humiliation, Afraid, Rape, and Kick questionnaire    Fear of Current or Ex-Partner: No    Emotionally Abused: No    Physically Abused: No    Sexually Abused: No    Family History: Family History  Problem Relation Age of Onset   Colon cancer Father 22    Current Medications:  Current Outpatient Medications:    acetaminophen  (TYLENOL ) 325 MG tablet, Take 650 mg by mouth every 6 (six) hours as needed., Disp: , Rfl:    ascorbic acid (VITAMIN C) 500 MG tablet, ascorbic acid (vitamin C) 500 mg tablet   500 mg every day by oral route., Disp: , Rfl:    dicyclomine (BENTYL) 10 MG  capsule, Take 10 mg by mouth 4 (four) times daily., Disp: , Rfl:    diphenoxylate-atropine  (LOMOTIL) 2.5-0.025 MG tablet, Take 1 tablet by mouth 4 (four) times daily as needed., Disp: , Rfl:    Docusate Sodium  (DSS) 100 MG CAPS, Take 200 mg by mouth. (Patient not taking: Reported on 05/20/2024), Disp: , Rfl:    HYDROcodone -acetaminophen  (NORCO/VICODIN) 5-325 MG tablet, , Disp: , Rfl:    levothyroxine  (SYNTHROID ) 112 MCG tablet, levothyroxine  112 mcg tablet   1 tablet every day by oral route., Disp: , Rfl:    levothyroxine  (SYNTHROID , LEVOTHROID) 112 MCG tablet, Take 112 mcg by mouth daily before breakfast. , Disp: , Rfl: 2   losartan  (COZAAR ) 50 MG tablet, losartan  50 mg tablet   2 tablets every day by oral route., Disp: , Rfl:    metoprolol  tartrate (LOPRESSOR ) 25 MG tablet, Take 25 mg by mouth 2 (two) times daily., Disp: , Rfl:    Multiple Vitamin (MULTIVITAMIN WITH MINERALS) TABS tablet, Take 1 tablet by mouth daily., Disp: , Rfl:    predniSONE (DELTASONE) 5 MG tablet, Take 1 dose pk by oral route in the morning. (Patient not taking: Reported on 05/20/2024), Disp: , Rfl:    RABEprazole (ACIPHEX) 20 MG tablet, Take 20 mg by mouth daily. (Patient not taking: Reported on 05/20/2024), Disp: , Rfl:    tiZANidine (ZANAFLEX) 2 MG tablet, Take 1 tablet twice a day by oral route at bedtime for 10 days, for low back pain., Disp: , Rfl:    Allergies: Allergies  Allergen Reactions   Sulfa Antibiotics Other (See Comments)    Dizziness and vision problems   Mobic [Meloxicam] Rash   Percocet [Oxycodone -Acetaminophen ] Other (See Comments)    Very sleepy. Sensitive to all narcotics.    REVIEW OF SYSTEMS:   Review of Systems  Constitutional:  Negative for chills, fatigue and fever.  HENT:   Negative for lump/mass, mouth sores, nosebleeds, sore throat and trouble swallowing.   Eyes:  Negative for eye problems.  Respiratory:  Negative for cough and shortness of breath.   Cardiovascular:  Negative for chest  pain, leg swelling and palpitations.  Gastrointestinal:  Negative for abdominal pain, constipation, diarrhea, nausea and vomiting.  Genitourinary:  Negative for bladder incontinence, difficulty urinating, dysuria, frequency, hematuria and nocturia.   Musculoskeletal:  Positive for back pain. Negative for arthralgias, flank pain, myalgias and neck pain.  Skin:  Negative for itching and rash.  Neurological:  Negative for dizziness, headaches and numbness.  Hematological:  Does not bruise/bleed easily.  Psychiatric/Behavioral:  Positive for sleep disturbance. Negative for depression and suicidal ideas. The patient is not nervous/anxious.   All other systems reviewed and are negative.    VITALS:   Blood pressure (!) 159/77, pulse (!) 55, temperature 98.2 F (36.8 C), temperature source Tympanic, resp. rate 20, weight 206 lb 2.1 oz (93.5 kg), SpO2 98%.  Wt Readings from Last 3 Encounters:  05/20/24 206 lb 2.1 oz (93.5 kg)  04/27/24 220 lb (99.8 kg)  04/27/24 219 lb 11.2 oz (99.7 kg)    Body mass index is 29.58 kg/m.  Performance status (ECOG): 1 - Symptomatic but completely ambulatory  PHYSICAL EXAM:   Physical Exam Vitals and nursing note reviewed. Exam conducted with a chaperone present.  Constitutional:      Appearance: Normal appearance.  Cardiovascular:     Rate and Rhythm: Normal rate and regular rhythm.     Pulses: Normal pulses.     Heart sounds: Normal heart sounds.  Pulmonary:     Effort: Pulmonary effort is normal.     Breath sounds: Normal breath sounds.  Abdominal:     Palpations: Abdomen is soft. There is no hepatomegaly, splenomegaly or mass.     Tenderness: There is no abdominal tenderness.  Musculoskeletal:     Right lower leg: No edema.     Left lower leg: No edema.  Lymphadenopathy:     Cervical: No cervical adenopathy.     Right cervical: No superficial, deep or posterior cervical adenopathy.    Left cervical: No superficial, deep or posterior cervical  adenopathy.     Upper Body:     Right upper body: No supraclavicular or axillary adenopathy.     Left upper body: No supraclavicular or axillary adenopathy.  Neurological:     General: No focal deficit present.     Mental Status: He is alert and oriented to person, place, and time.  Psychiatric:        Mood and Affect: Mood normal.        Behavior: Behavior normal.     LABS:   CBC    Component Value Date/Time   WBC 6.7 04/27/2024 0851   RBC 4.42 04/27/2024 0851   HGB 13.7 04/27/2024 0851   HCT 39.9 04/27/2024 0851   PLT 141 (L) 04/27/2024 0851   MCV 90.3 04/27/2024 0851   MCH 31.0 04/27/2024 0851   MCHC 34.3 04/27/2024 0851   RDW 13.0 04/27/2024 0851   LYMPHSABS 2.1 04/27/2024 0851   MONOABS 0.5 04/27/2024  0851   EOSABS 0.1 04/27/2024 0851   BASOSABS 0.0 04/27/2024 0851    CMP    Component Value Date/Time   NA 141 04/27/2024 0851   K 3.6 04/27/2024 0851   CL 105 04/27/2024 0851   CO2 28 04/27/2024 0851   GLUCOSE 149 (H) 04/27/2024 0851   BUN 21 04/27/2024 0851   CREATININE 1.02 04/27/2024 0851   CALCIUM 8.9 04/27/2024 0851   PROT 6.3 (L) 04/27/2024 0851   ALBUMIN  3.4 (L) 04/27/2024 0851   AST 22 04/27/2024 0851   ALT 24 04/27/2024 0851   ALKPHOS 220 (H) 04/27/2024 0851   BILITOT 0.8 04/27/2024 0851   GFRNONAA >60 04/27/2024 0851   GFRAA >60 08/06/2020 1301     Lab Results  Component Value Date   CEA1 4.5 04/27/2024   /  CEA  Date Value Ref Range Status  04/27/2024 4.5 0.0 - 4.7 ng/mL Final    Comment:    (NOTE)                             Nonsmokers          <3.9                             Smokers             <5.6 [employer] Electrochemiluminescence Immunoassay (ECLIA) Values obtained with different assay methods or kits cannot be used interchangeably.  Results cannot be interpreted as absolute evidence of the presence or absence of malignant disease. Performed At: [facility] [address] [place], [place] [id number] Pearlean Botts MD BJ:[id number]    No results found for: "PSA1" Lab Results  Component Value Date   CAN199 597 (H) 04/27/2024   No results found for: "CAN125"  No results found for: "TOTALPROTELP", "ALBUMINELP", "A1GS", "A2GS", "BETS", "BETA2SER", "GAMS", "MSPIKE", "SPEI" No results found for: "TIBC", "FERRITIN", "IRONPCTSAT" Lab Results  Component Value Date   LDH 141 04/27/2024     STUDIES:   CT CARDIAC SCORING (SELF PAY ONLY) Result Date: 05/19/2024 CLINICAL DATA:  Cardiovascular Disease Risk stratification EXAM: Coronary Calcium Score TECHNIQUE: A gated, non-contrast computed tomography scan of the heart was performed using 2.5 mm slice thickness. Axial images were analyzed on a dedicated workstation. Calcium scoring of the coronary arteries was performed using the Agatston method. FINDINGS: Coronary Calcium Score: Left main: 0 Left anterior descending artery: 17.2 Left circumflex artery: 48.1 Right coronary artery: 0 Total: 65.3 Percentile: 17th Pericardium: Normal. Non-cardiac: See separate report from [facility]. IMPRESSION: 1. Coronary calcium score of 65.3. This was 17th percentile for age-, race-, and sex-matched controls. RECOMMENDATIONS: Coronary artery calcium (CAC) score is a strong predictor of incident coronary heart disease (CHD) and provides predictive information beyond traditional risk factors. CAC scoring is reasonable to use in the decision to withhold, postpone, or initiate statin therapy in intermediate-risk or selected borderline-risk asymptomatic adults (age 32-75 years and LDL-C >=70 to <190 mg/dL) who do not have diabetes or established atherosclerotic cardiovascular disease (ASCVD).* In intermediate-risk (10-year ASCVD risk >=7.5% to <20%) adults or selected borderline-risk (10-year ASCVD risk >=5% to <7.5%) adults in whom a CAC score is measured for the purpose of making a treatment decision the following recommendations have been made: If CAC=0, it is reasonable  to withhold statin therapy and reassess in 5 to 10 years, as long as higher risk conditions are absent (  diabetes mellitus, family history of premature CHD in first degree relatives (males <55 years; females <65 years), cigarette smoking, or LDL >=190 mg/dL). If CAC is 1 to 99, it is reasonable to initiate statin therapy for patients >=23 years of age. If CAC is >=100 or >=75th percentile, it is reasonable to initiate statin therapy at any age. Cardiology referral should be considered for patients with CAC scores >=400 or >=75th percentile. *2018 AHA/ACC/AACVPR/AAPA/ABC/ACPM/ADA/AGS/APhA/ASPC/NLA/PCNA Guideline on the Management of Blood Cholesterol: A Report of the American College of Cardiology/American Heart Association Task Force on Clinical Practice Guidelines. J Am Coll Cardiol. 2019;73(24):3168-3209. Maudine Sos, MD Electronically Signed   By: Maudine Sos M.D.   On: 05/19/2024 16:29   NM PET Image Initial (PI) Skull Base To Thigh Addendum Date: 05/15/2024 ADDENDUM REPORT: 05/15/2024 14:57 CLINICAL DATA:  Initial treatment strategy for lung nodules. EXAM: NUCLEAR MEDICINE PET SKULL BASE TO THIGH TECHNIQUE: 11.26 mCi F-18 FDG was injected intravenously. Full-ring PET imaging was performed from the skull base to thigh after the radiotracer. CT data was obtained and used for attenuation correction and anatomic localization. Fasting blood glucose: 132 mg/dl COMPARISON:  CT chest without contrast 04/02/2024. Lumbar spine MRI 05/14/2024. FINDINGS: Mediastinal blood pool activity: SUV max 2.5 Liver activity: SUV max 3.1 NECK: No specific abnormal uptake seen in the neck including along lymph node change of the submandibular, posterior triangle internal jugular region. Near symmetric uptake of the visualized intracranial compartment. Incidental CT findings: Slight mucosal thickening along left maxillary sinus. Other paranasal sinuses are clear. Mastoid air cells are clear. The parotid glands,  submandibular glands are unremarkable. Atrophic thyroid  gland. Surgical clips at the thoracic inlet on the left side. CHEST: No specific abnormal uptake identified in the axillary region, hilum or mediastinum. No abnormal uptake in the along the lungs. As on previous there are numerous tiny lung nodules scattered throughout the lower lungs. Many are under 5 mm. Few larger foci are identified. For example the lesion seen left lower lobe anterolateral measuring 15 x 8 mm. Today on image 66 12 x 8 mm. No abnormal uptake. Maximum SUV of this area of 1.8. Right apical lesion which previously measured 14 mm is stable on image 42. This has maximum SUV of 2.1. Due to their small size, many of these lesions may be below threshold for uptake on PET Incidental CT findings: Heart is slightly enlarged. No pericardial effusion. Thoracic aorta is normal course and caliber with some calcified plaque. Breathing motion. No pleural effusion or pneumothorax. No consolidation. Chronic lung changes. ABDOMEN/PELVIS: There is a focal area of abnormal uptake along the tail the pancreas with maximum SUV value of 4.5. There is a low-attenuation lesion in this location on image 85 measuring 2.7 cm. There is an abnormal lymph node identified in the left upper abdomen measuring 3.4 x 2.0 cm. This does not have abnormal uptake. There are a few other prominent but not pathologic enlarged nodes seen in the abdomen including periceliac, periaortic and possible central mesentery. Incidental CT findings: Slightly nodular contour to liver. Stones in the contracted gallbladder. Spleen is enlarged measuring 13.4 cm in AP length. The adrenal glands are preserved. No renal or ureteral stones. Preserved contour to the urinary bladder. Large bowel is normal course and caliber with scattered colonic stool. The stomach and small bowel are nondilated. No free air or free fluid. SKELETON: There is a lucent lesion along the sternum identified on image 68. This  has maximum SUV value 6.6. This lesion appears to  be new compared to the prior examination and in retrospect there is a subtle area of irregularity in this location. Aggressive lesion is possible. Lesion extends over a longitudinal length of the sternum of close to 6 cm. Please correlate for any known history. There is some focal uptake within the L4 vertebral body with maximum SUV value of 5.7. Heterogeneous appearance of this vertebral body. Incidental CT findings: Fixation hardware along the lower lumbar spine. Scattered degenerative changes along the spine and pelvis. IMPRESSION: Focal mass lesion along the tail the pancreas with abnormal uptake. Worrisome for a pancreatic neoplasm. Recommend further evaluation with dedicated pancreatic imaging. There is an abnormal lymph node identified in the left upper abdomen. However this does not show abnormal uptake. This is still suspicious based on the size of the lymph node. There is some others small but prominent upper retroperitoneal nodes identified as well which are not hypermetabolic including periceliac, portacaval and periaortic. Hypermetabolic lesions identified along the lower sternum and at the L4 vertebral body. Bony metastatic lesions are possible. Recommend further evaluation. Numerous small lung nodules are identified these are unchanged from the recent prior and are not hypermetabolic. In light of the other findings these are suspicious. Electronically Signed   By: Adrianna Horde M.D.   On: 05/15/2024 14:57   Result Date: 05/15/2024 CLINICAL DATA:  Initial treatment strategy for lung nodules. EXAM: NUCLEAR MEDICINE PET SKULL BASE TO THIGH TECHNIQUE: 11.26 mCi F-18 FDG was injected intravenously. Full-ring PET imaging was performed from the skull base to thigh after the radiotracer. CT data was obtained and used for attenuation correction and anatomic localization. Fasting blood glucose: 132 mg/dl COMPARISON:  CT chest without contrast 04/02/2024  FINDINGS: Mediastinal blood pool activity: SUV max 2.5 Liver activity: SUV max 3.1 NECK: No specific abnormal uptake seen in the neck including along lymph node change of the submandibular, posterior triangle internal jugular region. Near symmetric uptake of the visualized intracranial compartment. Incidental CT findings: Slight mucosal thickening along left maxillary sinus. Other paranasal sinuses are clear. Mastoid air cells are clear. The parotid glands, submandibular glands are unremarkable. Atrophic thyroid  gland. Surgical clips at the thoracic inlet on the left side. CHEST: No specific abnormal uptake identified in the axillary region, hilum or mediastinum. No abnormal uptake in the along the lungs. As on previous there are numerous tiny lung nodules scattered throughout the lower lungs. Many are under 5 mm. Few larger foci are identified. For example the lesion seen left lower lobe anterolateral measuring 15 x 8 mm. Today on image 66 12 x 8 mm. No abnormal uptake. Maximum SUV of this area of 1.8. Right apical lesion which previously measured 14 mm is stable on image 42. This has maximum SUV of 2.1. Incidental CT findings: Heart is slightly enlarged. No pericardial effusion. Thoracic aorta is normal course and caliber with some calcified plaque. Breathing motion. No pleural effusion or pneumothorax. No consolidation. Chronic lung changes. ABDOMEN/PELVIS: There is a focal area of abnormal uptake along the tail the pancreas with maximum SUV value of 4.5. There is a low-attenuation lesion in this location on image 85 measuring 2.7 cm. There is an abnormal lymph node identified in the left upper abdomen measuring 3.4 x 2.0 cm. This does not have abnormal uptake. Incidental CT findings: Slightly nodular contour to liver. Stones in the contracted gallbladder. Spleen is enlarged measuring 13.4 cm in AP length. The adrenal glands are preserved. No renal or ureteral stones. Preserved contour to the urinary bladder.  Large  bowel is normal course and caliber with scattered colonic stool. The stomach and small bowel are nondilated. There are a few prominent retroperitoneal nodes identified but these are less than a cm in short axis not pathologic by size criteria. SKELETON: There is a lucent lesion along the sternum identified on image 68. This has maximum SUV value 6.6. This lesion appears to be new compared to the prior examination and in retrospect there is a subtle area of irregularity in this location. Aggressive lesion is possible. Lesion extends over a longitudinal length of the sternum of close to 6 cm. Please correlate for any known history. There is some focal uptake within the L4 vertebral body with maximum SUV value of 5.7. Heterogeneous appearance of this vertebral body. Incidental CT findings: Fixation hardware along the lower lumbar spine. Scattered degenerative changes along the spine and pelvis. IMPRESSION: Focal mass lesion along the tail the pancreas with abnormal uptake. Worrisome for a pancreatic neoplasm. Recommend further evaluation with dedicated pancreatic imaging. There is an abnormal lymph node identified in the left upper abdomen. However this is not show abnormal uptake. This is still suspicious based on the size of the lymph node. There is some others small but prominent upper retroperitoneal nodes identified as well which are not hypermetabolic including periceliac, portacaval and periaortic. Hypermetabolic lesions identified along the lower sternum and at the L4 vertebral body. Bony metastatic lesions are possible. Recommend further evaluation. Numerous small lung nodules are identified these are unchanged from the recent prior but are not hypermetabolic. In light of the other findings these are still suspicious. Electronically Signed: By: Adrianna Horde M.D. On: 05/15/2024 13:32   MR Lumbar Spine W Wo Contrast Result Date: 05/14/2024 CLINICAL DATA:  Lumbar radiculopathy. EXAM: MRI LUMBAR SPINE  WITHOUT AND WITH CONTRAST TECHNIQUE: Multiplanar and multiecho pulse sequences of the lumbar spine were obtained without and with intravenous contrast. CONTRAST:  8mL GADAVIST  GADOBUTROL  1 MMOL/ML IV SOLN COMPARISON:  MRI of lumbar spine without contrast 07/31/2021 FINDINGS: Segmentation: 5 non rib-bearing lumbar type vertebral bodies are present. The lowest fully formed vertebral body is L5. Alignment: Grade 1 anterolisthesis at L4-5 is stable infused. Slight degenerative retrolisthesis at T12-L1, L1-2 and L2-3 is similar to the prior exam. Vertebrae: Enhancing mass lesion is present along the posteroinferior left side of the L3 vertebral body measuring 2.6 x 2.7 x 1.2 cm. Extraosseous enhancing soft tissue extends into the left L3-4 foramen. No other focal osseous lesions are present. Vertebral body heights are normal. Type 2 Modic changes are present at L4-5 and L5-S1. Marrow signal is otherwise within normal limits. Conus medullaris and cauda equina: Conus extends to the L1-2 level. Conus and cauda equina appear normal. Paraspinal and other soft tissues: Limited imaging the abdomen is unremarkable. There is no significant adenopathy. No solid organ lesions are present. Disc levels: T12-L1: Normal disc signal and height is present. No focal protrusion or stenosis is present. L1-2: Mild disc bulging and facet hypertrophy is present. No focal stenosis is present. L2-3: A broad-based disc protrusion is present. Moderate facet hypertrophy is noted bilaterally. Mild subarticular and moderate bilateral foraminal stenosis is present. L3-4: A leftward disc protrusion is present. Moderate left foraminal stenosis is secondary to disc disease and enhancing tumor. The right foramen is patent. L4-5: Solid fusion is present. Grade 1 anterolisthesis is stable. No residual or recurrent stenosis is present. L5-S1: Solid fusion is present. Grade 1 anterolisthesis is stable. No residual or recurrent stenosis is present. IMPRESSION:  1. Enhancing  mass lesion along the posteroinferior left side of the L3 vertebral body measuring 2.6 x 2.7 x 1.2 cm. Extraosseous enhancing soft tissue extends into the left L3-4 foramen. Findings are concerning for metastatic disease. 2. Solid fusion at L4-5 and L5-S1 without residual or recurrent stenosis. 3. Mild subarticular and moderate bilateral foraminal stenosis at L2-3 is similar to the prior exam. 4. Moderate left foraminal stenosis at L3-4 is secondary to disc disease and enhancing tumor. Electronically Signed   By: Audree Leas M.D.   On: 05/14/2024 17:06

## 2024-05-22 ENCOUNTER — Ambulatory Visit (HOSPITAL_COMMUNITY)
Admission: RE | Admit: 2024-05-22 | Discharge: 2024-05-22 | Disposition: A | Discharge status: Home / Self Care | Source: Ambulatory Visit | Attending: Hematology | Admitting: Hematology

## 2024-05-22 DIAGNOSIS — K8689 Other specified diseases of pancreas: Secondary | ICD-10-CM | POA: Diagnosis not present

## 2024-05-22 DIAGNOSIS — R161 Splenomegaly, not elsewhere classified: Secondary | ICD-10-CM | POA: Diagnosis not present

## 2024-05-22 DIAGNOSIS — K8020 Calculus of gallbladder without cholecystitis without obstruction: Secondary | ICD-10-CM | POA: Diagnosis not present

## 2024-05-22 DIAGNOSIS — N281 Cyst of kidney, acquired: Secondary | ICD-10-CM | POA: Diagnosis not present

## 2024-05-22 MED ORDER — IOHEXOL 300 MG/ML  SOLN
100.0000 mL | Freq: Once | INTRAMUSCULAR | Status: AC | PRN
  Administered 2024-05-22: 100 mL via INTRAVENOUS

## 2024-05-22 NOTE — Progress Notes (Signed)
 Myrlene Asper, DO  Michaux, Jennifer L; Deshawnda Acrey, [place], can we please have D review this for L3 bone biopsy?  I think we should probably create a pathway such that spine lesion biopsy requests are forwarded to the [facility] service directly.  Mabel Savage       Previous Messages    ----- Message ----- From: Melanny Wire Sent: 05/21/2024   4:30 PM EDT To: Rosetta Cons, MD; Ir Procedure Requests Subject: FW: CT Biopsy                                   ----- Message ----- From: Nellie Pester Sent: 05/20/2024   8:38 AM EDT To: Rosetta Cons, MD; Joelle Musca; * Subject: CT Biopsy                                      Procedure : Ct biopsy  Reason : biopsy L3 vertebral lesion Dx: Bone lesion [M89.9 (ICD-10-CM)]    History : CT cardiac scoring , NM PET Skull base to thigh , mr lumbar spine w/wo , CT Chest wo  Provider : Paulett Boros, MD  Provider contact :  [phone]

## 2024-05-26 DIAGNOSIS — M5442 Lumbago with sciatica, left side: Secondary | ICD-10-CM | POA: Diagnosis not present

## 2024-05-28 ENCOUNTER — Encounter: Payer: Self-pay | Admitting: Surgery

## 2024-06-01 DIAGNOSIS — D492 Neoplasm of unspecified behavior of bone, soft tissue, and skin: Secondary | ICD-10-CM | POA: Diagnosis not present

## 2024-06-01 DIAGNOSIS — Z6829 Body mass index (BMI) 29.0-29.9, adult: Secondary | ICD-10-CM | POA: Diagnosis not present

## 2024-06-01 DIAGNOSIS — M5416 Radiculopathy, lumbar region: Secondary | ICD-10-CM | POA: Diagnosis not present

## 2024-06-02 ENCOUNTER — Other Ambulatory Visit: Payer: Self-pay | Admitting: Neurosurgery

## 2024-06-03 NOTE — Progress Notes (Signed)
 Surgical Instructions   Your procedure is scheduled on Tuesday June 09, 2024. Report to [facility] Main Entrance "A" at 11:00 A.M., then check in with the Admitting office. Any questions or running late day of surgery: call [phone]  Questions prior to your surgery date: call [phone], Monday-Friday, 8am-4pm. If you experience any cold or flu symptoms such as cough, fever, chills, shortness of breath, etc. between now and your scheduled surgery, please notify us  at the above number.     Remember:  Do not eat or drink after midnight the night before your surgery  Take these medicines the morning of surgery with A SIP OF WATER   levothyroxine  (SYNTHROID , LEVOTHROID)  metoprolol  tartrate (LOPRESSOR )   May take these medicines IF NEEDED: acetaminophen  (TYLENOL )  HYDROcodone -acetaminophen  (NORCO/VICODIN)   One week prior to surgery, STOP taking any Aspirin (unless otherwise instructed by your surgeon) Aleve, Naproxen, Ibuprofen, Motrin, Advil, Goody's, BC's, all herbal medications, fish oil, and non-prescription vitamins.                     Do NOT Smoke (Tobacco/Vaping) for 24 hours prior to your procedure.  If you use a CPAP at night, you may bring your mask/headgear for your overnight stay.   You will be asked to remove any contacts, glasses, piercing's, hearing aid's, dentures/partials prior to surgery. Please bring cases for these items if needed.    Patients discharged the day of surgery will not be allowed to drive home, and someone needs to stay with them for 24 hours.  SURGICAL WAITING ROOM VISITATION Patients may have no more than 2 support people in the waiting area - these visitors may rotate.   Pre-op nurse will coordinate an appropriate time for 1 ADULT support person, who may not rotate, to accompany patient in pre-op.  Children under the age of 61 must have an adult with them who is not the patient and must remain in the main waiting area with an adult.  If the  patient needs to stay at the hospital during part of their recovery, the visitor guidelines for inpatient rooms apply.  Please refer to the [facility] website for the visitor guidelines for any additional information.   If you received a COVID test during your pre-op visit  it is requested that you wear a mask when out in public, stay away from anyone that may not be feeling well and notify your surgeon if you develop symptoms. If you have been in contact with anyone that has tested positive in the last 10 days please notify you surgeon.      Pre-operative 5 CHG Bathing Instructions   You can play a key role in reducing the risk of infection after surgery. Your skin needs to be as free of germs as possible. You can reduce the number of germs on your skin by washing with CHG (chlorhexidine  gluconate) soap before surgery. CHG is an antiseptic soap that kills germs and continues to kill germs even after washing.   DO NOT use if you have an allergy to chlorhexidine /CHG or antibacterial soaps. If your skin becomes reddened or irritated, stop using the CHG and notify one of our RNs at [phone].   Please shower with the CHG soap starting 4 days before surgery using the following schedule:     Please keep in mind the following:  You may shave your face at any point before/day of surgery.  Place clean sheets on your bed the day you start using  CHG soap. Use a clean washcloth (not used since being washed) for each shower. DO NOT sleep with pets once you start using the CHG.   CHG Shower Instructions:  Wash your face and private area with normal soap. If you choose to wash your hair, wash first with your normal shampoo.  After you use shampoo/soap, rinse your hair and body thoroughly to remove shampoo/soap residue.  Turn the water  OFF and apply about 3 tablespoons (45 ml) of CHG soap to a CLEAN washcloth.  Apply CHG soap ONLY FROM YOUR NECK DOWN TO YOUR TOES (washing for 3-5 minutes)  DO NOT  use CHG soap on face, private areas, open wounds, or sores.  Pay special attention to the area where your surgery is being performed.  If you are having back surgery, having someone wash your back for you may be helpful. Wait 2 minutes after CHG soap is applied, then you may rinse off the CHG soap.  Pat dry with a clean towel  Put on clean clothes/pajamas   If you choose to wear lotion, please use ONLY the CHG-compatible lotions that are listed below.  Additional instructions for the day of surgery: DO NOT APPLY any lotions, deodorants or cologne   Do not bring valuables to the hospital. [facility] is not responsible for any belongings/valuables. Do not wear jewelry Put on clean/comfortable clothes.  Please brush your teeth.  Ask your nurse before applying any prescription medications to the skin.     CHG Compatible Lotions   Aveeno Moisturizing lotion  Cetaphil Moisturizing Cream  Cetaphil Moisturizing Lotion  Clairol Herbal Essence Moisturizing Lotion, Dry Skin  Clairol Herbal Essence Moisturizing Lotion, Extra Dry Skin  Clairol Herbal Essence Moisturizing Lotion, Normal Skin  Curel Age Defying Therapeutic Moisturizing Lotion with Alpha Hydroxy  Curel Extreme Care Body Lotion  Curel Soothing Hands Moisturizing Hand Lotion  Curel Therapeutic Moisturizing Cream, Fragrance-Free  Curel Therapeutic Moisturizing Lotion, Fragrance-Free  Curel Therapeutic Moisturizing Lotion, Original Formula  Eucerin Daily Replenishing Lotion  Eucerin Dry Skin Therapy Plus Alpha Hydroxy Crme  Eucerin Dry Skin Therapy Plus Alpha Hydroxy Lotion  Eucerin Original Crme  Eucerin Original Lotion  Eucerin Plus Crme Eucerin Plus Lotion  Eucerin TriLipid Replenishing Lotion  Keri Anti-Bacterial Hand Lotion  Keri Deep Conditioning Original Lotion Dry Skin Formula Softly Scented  Keri Deep Conditioning Original Lotion, Fragrance Free Sensitive Skin Formula  Keri Lotion Fast Absorbing Fragrance Free  Sensitive Skin Formula  Keri Lotion Fast Absorbing Softly Scented Dry Skin Formula  Keri Original Lotion  Keri Skin Renewal Lotion Keri Silky Smooth Lotion  Keri Silky Smooth Sensitive Skin Lotion  Nivea Body Creamy Conditioning Oil  Nivea Body Extra Enriched Lotion  Nivea Body Original Lotion  Nivea Body Sheer Moisturizing Lotion Nivea Crme  Nivea Skin Firming Lotion  NutraDerm 30 Skin Lotion  NutraDerm Skin Lotion  NutraDerm Therapeutic Skin Cream  NutraDerm Therapeutic Skin Lotion  ProShield Protective Hand Cream  Provon moisturizing lotion  Please read over the following fact sheets that you were given.

## 2024-06-04 ENCOUNTER — Encounter (HOSPITAL_COMMUNITY): Payer: Self-pay

## 2024-06-04 ENCOUNTER — Telehealth (HOSPITAL_COMMUNITY): Payer: Self-pay

## 2024-06-04 ENCOUNTER — Encounter (HOSPITAL_COMMUNITY)
Admission: RE | Admit: 2024-06-04 | Discharge: 2024-06-04 | Disposition: A | Discharge status: Home / Self Care | Source: Ambulatory Visit | Attending: Neurosurgery | Admitting: Neurosurgery

## 2024-06-04 ENCOUNTER — Other Ambulatory Visit: Payer: Self-pay

## 2024-06-04 VITALS — BP 161/83 | HR 65 | Temp 98.0°F | Resp 18 | Ht 70.0 in | Wt 204.0 lb

## 2024-06-04 DIAGNOSIS — Z01818 Encounter for other preprocedural examination: Secondary | ICD-10-CM | POA: Insufficient documentation

## 2024-06-04 LAB — CBC
HCT: 40.7 % (ref 39.0–52.0)
Hemoglobin: 14.2 g/dL (ref 13.0–17.0)
MCH: 31.7 pg (ref 26.0–34.0)
MCHC: 34.9 g/dL (ref 30.0–36.0)
MCV: 90.8 fL (ref 80.0–100.0)
Platelets: 122 10*3/uL — ABNORMAL LOW (ref 150–400)
RBC: 4.48 MIL/uL (ref 4.22–5.81)
RDW: 13.2 % (ref 11.5–15.5)
WBC: 7.5 10*3/uL (ref 4.0–10.5)
nRBC: 0 % (ref 0.0–0.2)

## 2024-06-04 LAB — SURGICAL PCR SCREEN
MRSA, PCR: NEGATIVE
Staphylococcus aureus: NEGATIVE

## 2024-06-04 NOTE — Progress Notes (Addendum)
 PCP - Dr. Minus Amel Cardiologist - denies  PPM/ICD - denies Device Orders - na Rep Notified - na  Chest x-ray - na EKG - PAT, 06/04/2024 Stress Test - 02/17/2017 ECHO -  Cardiac Cath -   Sleep Study - denies CPAP - na  Non-diabetic  Blood Thinner Instructions: denies Aspirin Instructions:denies  ERAS Protcol - NPO  Anesthesia review: Yes, HTN.  EKG obtained in PAT shows first degree block, no old EKG to compare to.   Patient denies shortness of breath, fever, cough and chest pain at PAT appointment   All instructions explained to the patient, with a verbal understanding of the material. Patient agrees to go over the instructions while at home for a better understanding. Patient also instructed to self quarantine after being tested for COVID-19. The opportunity to ask questions was provided.

## 2024-06-04 NOTE — Telephone Encounter (Signed)
Called to schedule biopsy, no answer, left vm. AB

## 2024-06-04 NOTE — Telephone Encounter (Signed)
 Ok per Dr. Alvira Josephs for L3 left sided biopsy

## 2024-06-04 NOTE — Telephone Encounter (Signed)
 Called to schedule L3 biopsy but pt is already scheduled with neurosurgery on 6/10. AB

## 2024-06-05 DIAGNOSIS — R197 Diarrhea, unspecified: Secondary | ICD-10-CM | POA: Diagnosis not present

## 2024-06-05 DIAGNOSIS — R7989 Other specified abnormal findings of blood chemistry: Secondary | ICD-10-CM | POA: Diagnosis not present

## 2024-06-05 DIAGNOSIS — H35033 Hypertensive retinopathy, bilateral: Secondary | ICD-10-CM | POA: Diagnosis not present

## 2024-06-05 DIAGNOSIS — H43813 Vitreous degeneration, bilateral: Secondary | ICD-10-CM | POA: Diagnosis not present

## 2024-06-05 DIAGNOSIS — C801 Malignant (primary) neoplasm, unspecified: Secondary | ICD-10-CM | POA: Diagnosis not present

## 2024-06-05 DIAGNOSIS — H11152 Pinguecula, left eye: Secondary | ICD-10-CM | POA: Diagnosis not present

## 2024-06-05 DIAGNOSIS — H04123 Dry eye syndrome of bilateral lacrimal glands: Secondary | ICD-10-CM | POA: Diagnosis not present

## 2024-06-06 LAB — HEPATIC FUNCTION PANEL
ALT: 18 IU/L (ref 0–44)
AST: 23 IU/L (ref 0–40)
Albumin: 4 g/dL (ref 3.7–4.7)
Alkaline Phosphatase: 219 IU/L — ABNORMAL HIGH (ref 44–121)
Bilirubin Total: 0.7 mg/dL (ref 0.0–1.2)
Bilirubin, Direct: 0.3 mg/dL (ref 0.00–0.40)
Total Protein: 5.8 g/dL — ABNORMAL LOW (ref 6.0–8.5)

## 2024-06-08 NOTE — Anesthesia Preprocedure Evaluation (Signed)
 Anesthesia Evaluation  Patient identified by MRN, date of birth, ID band Patient awake    Reviewed: Allergy & Precautions, H&P , NPO status , Patient's Chart, lab work & pertinent test results  History of Anesthesia Complications (+) PROLONGED EMERGENCE and history of anesthetic complications  Airway Mallampati: II  TM Distance: <3 FB Neck ROM: Full    Dental no notable dental hx. (+) Teeth Intact, Dental Advisory Given, Caps ANTERIOR:   Pulmonary neg pulmonary ROS   Pulmonary exam normal breath sounds clear to auscultation       Cardiovascular Exercise Tolerance: Good hypertension, Pt. on medications and Pt. on home beta blockers negative cardio ROS Normal cardiovascular exam Rhythm:Regular Rate:Normal     Neuro/Psych  Neuromuscular disease negative neurological ROS  negative psych ROS   GI/Hepatic negative GI ROS, Neg liver ROS,,,  Endo/Other  negative endocrine ROSHypothyroidism    Renal/GU negative Renal ROS  negative genitourinary   Musculoskeletal negative musculoskeletal ROS (+) Arthritis , Osteoarthritis,    Abdominal   Peds negative pediatric ROS (+)  Hematology negative hematology ROS (+)   Anesthesia Other Findings   Reproductive/Obstetrics negative OB ROS                             Anesthesia Physical Anesthesia Plan  ASA: 3  Anesthesia Plan: General   Post-op Pain Management: Tylenol  PO (pre-op)* and Celebrex  PO (pre-op)*   Induction: Intravenous  PONV Risk Score and Plan: 2 and Ondansetron   Airway Management Planned: Oral ETT and Video Laryngoscope Planned  Additional Equipment: None  Intra-op Plan:   Post-operative Plan: Extubation in OR  Informed Consent: I have reviewed the patients History and Physical, chart, labs and discussed the procedure including the risks, benefits and alternatives for the proposed anesthesia with the patient or authorized  representative who has indicated his/her understanding and acceptance.       Plan Discussed with: Anesthesiologist and CRNA  Anesthesia Plan Comments: (  )        Anesthesia Quick Evaluation

## 2024-06-09 ENCOUNTER — Encounter (HOSPITAL_COMMUNITY): Payer: Self-pay | Admitting: Neurosurgery

## 2024-06-09 ENCOUNTER — Observation Stay (HOSPITAL_COMMUNITY)
Admission: RE | Admit: 2024-06-09 | Discharge: 2024-06-10 | Disposition: A | Discharge status: Home / Self Care | Attending: Neurosurgery | Admitting: Neurosurgery

## 2024-06-09 ENCOUNTER — Ambulatory Visit (HOSPITAL_COMMUNITY): Payer: Self-pay | Admitting: Physician Assistant

## 2024-06-09 ENCOUNTER — Encounter (HOSPITAL_COMMUNITY)
Admission: RE | Disposition: A | Discharge status: Home / Self Care | Payer: Self-pay | Source: Home / Self Care | Attending: Neurosurgery

## 2024-06-09 ENCOUNTER — Other Ambulatory Visit: Payer: Self-pay

## 2024-06-09 ENCOUNTER — Encounter: Payer: Self-pay | Admitting: Licensed Clinical Social Worker

## 2024-06-09 ENCOUNTER — Ambulatory Visit (HOSPITAL_COMMUNITY)

## 2024-06-09 ENCOUNTER — Ambulatory Visit: Payer: Self-pay | Admitting: Internal Medicine

## 2024-06-09 DIAGNOSIS — Z85828 Personal history of other malignant neoplasm of skin: Secondary | ICD-10-CM | POA: Diagnosis not present

## 2024-06-09 DIAGNOSIS — Z79899 Other long term (current) drug therapy: Secondary | ICD-10-CM | POA: Insufficient documentation

## 2024-06-09 DIAGNOSIS — D334 Benign neoplasm of spinal cord: Secondary | ICD-10-CM | POA: Diagnosis not present

## 2024-06-09 DIAGNOSIS — M5416 Radiculopathy, lumbar region: Secondary | ICD-10-CM

## 2024-06-09 DIAGNOSIS — Z0189 Encounter for other specified special examinations: Secondary | ICD-10-CM | POA: Diagnosis not present

## 2024-06-09 DIAGNOSIS — I10 Essential (primary) hypertension: Secondary | ICD-10-CM | POA: Diagnosis not present

## 2024-06-09 DIAGNOSIS — Z96653 Presence of artificial knee joint, bilateral: Secondary | ICD-10-CM | POA: Insufficient documentation

## 2024-06-09 DIAGNOSIS — Z1379 Encounter for other screening for genetic and chromosomal anomalies: Secondary | ICD-10-CM | POA: Insufficient documentation

## 2024-06-09 DIAGNOSIS — D434 Neoplasm of uncertain behavior of spinal cord: Secondary | ICD-10-CM | POA: Diagnosis not present

## 2024-06-09 DIAGNOSIS — C720 Malignant neoplasm of spinal cord: Secondary | ICD-10-CM | POA: Insufficient documentation

## 2024-06-09 DIAGNOSIS — D492 Neoplasm of unspecified behavior of bone, soft tissue, and skin: Secondary | ICD-10-CM | POA: Diagnosis not present

## 2024-06-09 HISTORY — PX: FAR LATERAL DECOMPRESSION 1 LEVEL: SHX7571

## 2024-06-09 LAB — POCT I-STAT, CHEM 8
BUN: 21 mg/dL (ref 8–23)
Calcium, Ion: 1.12 mmol/L — ABNORMAL LOW (ref 1.15–1.40)
Chloride: 107 mmol/L (ref 98–111)
Creatinine, Ser: 0.9 mg/dL (ref 0.61–1.24)
Glucose, Bld: 123 mg/dL — ABNORMAL HIGH (ref 70–99)
HCT: 40 % (ref 39.0–52.0)
Hemoglobin: 13.6 g/dL (ref 13.0–17.0)
Potassium: 4 mmol/L (ref 3.5–5.1)
Sodium: 143 mmol/L (ref 135–145)
TCO2: 24 mmol/L (ref 22–32)

## 2024-06-09 SURGERY — FAR LATERAL DECOMPRESSION 1 LEVEL
Anesthesia: General | Laterality: Left

## 2024-06-09 MED ORDER — PROPOFOL 10 MG/ML IV BOLUS
INTRAVENOUS | Status: DC | PRN
  Administered 2024-06-09: 150 mg via INTRAVENOUS

## 2024-06-09 MED ORDER — ONDANSETRON HCL 4 MG PO TABS: 4.0000 mg | ORAL_TABLET | Freq: Four times a day (QID) | ORAL | Status: DC | PRN

## 2024-06-09 MED ORDER — OXYCODONE HCL 5 MG/5ML PO SOLN: 5.0000 mg | Freq: Once | ORAL | Status: DC | PRN

## 2024-06-09 MED ORDER — THROMBIN 5000 UNITS EX KIT
PACK | CUTANEOUS | Status: AC
  Filled 2024-06-09: qty 1

## 2024-06-09 MED ORDER — HYDROCODONE-ACETAMINOPHEN 5-325 MG PO TABS: 1.0000 | ORAL_TABLET | ORAL | Status: DC | PRN

## 2024-06-09 MED ORDER — METOPROLOL TARTRATE 25 MG PO TABS
25.0000 mg | ORAL_TABLET | Freq: Two times a day (BID) | ORAL | Status: DC
  Administered 2024-06-09: 25 mg via ORAL
  Filled 2024-06-09: qty 1

## 2024-06-09 MED ORDER — CEFAZOLIN SODIUM-DEXTROSE 2-4 GM/100ML-% IV SOLN
INTRAVENOUS | Status: AC
  Filled 2024-06-09: qty 100

## 2024-06-09 MED ORDER — FENTANYL CITRATE (PF) 250 MCG/5ML IJ SOLN
INTRAMUSCULAR | Status: AC
  Filled 2024-06-09: qty 5

## 2024-06-09 MED ORDER — BUPIVACAINE HCL (PF) 0.5 % IJ SOLN
INTRAMUSCULAR | Status: AC
  Filled 2024-06-09: qty 30

## 2024-06-09 MED ORDER — HYDROCODONE-ACETAMINOPHEN 7.5-325 MG PO TABS
2.0000 | ORAL_TABLET | ORAL | Status: DC | PRN
  Administered 2024-06-09 (×2): 2 via ORAL
  Administered 2024-06-10 (×2): 1 via ORAL
  Filled 2024-06-09 (×4): qty 2

## 2024-06-09 MED ORDER — ACETAMINOPHEN 325 MG PO TABS
650.0000 mg | ORAL_TABLET | ORAL | Status: DC | PRN
Start: 2024-06-09 — End: 2024-06-10

## 2024-06-09 MED ORDER — PHENOL 1.4 % MT LIQD: 1.0000 | OROMUCOSAL | Status: DC | PRN

## 2024-06-09 MED ORDER — LOSARTAN POTASSIUM 50 MG PO TABS: 100.0000 mg | ORAL_TABLET | Freq: Every day | ORAL | Status: DC

## 2024-06-09 MED ORDER — ORAL CARE MOUTH RINSE: 15.0000 mL | Freq: Once | OROMUCOSAL | Status: AC

## 2024-06-09 MED ORDER — METHOCARBAMOL 1000 MG/10ML IJ SOLN
500.0000 mg | Freq: Four times a day (QID) | INTRAMUSCULAR | Status: DC | PRN
Start: 2024-06-09 — End: 2024-06-10

## 2024-06-09 MED ORDER — SODIUM CHLORIDE 0.9% FLUSH: 3.0000 mL | Freq: Two times a day (BID) | INTRAVENOUS | Status: DC

## 2024-06-09 MED ORDER — FENTANYL CITRATE (PF) 250 MCG/5ML IJ SOLN
INTRAMUSCULAR | Status: DC | PRN
  Administered 2024-06-09 (×2): 50 ug via INTRAVENOUS

## 2024-06-09 MED ORDER — DICYCLOMINE HCL 10 MG PO CAPS
10.0000 mg | ORAL_CAPSULE | Freq: Every day | ORAL | Status: DC
  Administered 2024-06-09: 10 mg via ORAL
  Filled 2024-06-09: qty 1

## 2024-06-09 MED ORDER — SENNA 8.6 MG PO TABS
1.0000 | ORAL_TABLET | Freq: Two times a day (BID) | ORAL | Status: DC
  Filled 2024-06-09: qty 1

## 2024-06-09 MED ORDER — PANTOPRAZOLE SODIUM 40 MG PO TBEC
40.0000 mg | DELAYED_RELEASE_TABLET | Freq: Every day | ORAL | Status: DC
  Administered 2024-06-09: 40 mg via ORAL
  Filled 2024-06-09: qty 1

## 2024-06-09 MED ORDER — SODIUM CHLORIDE 0.9% FLUSH: 3.0000 mL | INTRAVENOUS | Status: DC | PRN

## 2024-06-09 MED ORDER — DEXAMETHASONE SODIUM PHOSPHATE 10 MG/ML IJ SOLN
INTRAMUSCULAR | Status: DC | PRN
  Administered 2024-06-09: 4 mg via INTRAVENOUS

## 2024-06-09 MED ORDER — LIDOCAINE-EPINEPHRINE 1 %-1:100000 IJ SOLN
INTRAMUSCULAR | Status: AC
  Filled 2024-06-09: qty 1

## 2024-06-09 MED ORDER — ROCURONIUM BROMIDE 10 MG/ML (PF) SYRINGE
PREFILLED_SYRINGE | INTRAVENOUS | Status: DC | PRN
  Administered 2024-06-09 (×2): 20 mg via INTRAVENOUS
  Administered 2024-06-09: 30 mg via INTRAVENOUS
  Administered 2024-06-09: 60 mg via INTRAVENOUS

## 2024-06-09 MED ORDER — FENTANYL CITRATE (PF) 100 MCG/2ML IJ SOLN
25.0000 ug | INTRAMUSCULAR | Status: DC | PRN
  Administered 2024-06-09: 50 ug via INTRAVENOUS

## 2024-06-09 MED ORDER — ACETAMINOPHEN 650 MG RE SUPP: 650.0000 mg | RECTAL | Status: DC | PRN

## 2024-06-09 MED ORDER — MENTHOL 3 MG MT LOZG: 1.0000 | LOZENGE | OROMUCOSAL | Status: DC | PRN

## 2024-06-09 MED ORDER — SUGAMMADEX SODIUM 200 MG/2ML IV SOLN
INTRAVENOUS | Status: DC | PRN
  Administered 2024-06-09: 200 mg via INTRAVENOUS

## 2024-06-09 MED ORDER — LEVOTHYROXINE SODIUM 112 MCG PO TABS
112.0000 ug | ORAL_TABLET | Freq: Every day | ORAL | Status: DC
  Administered 2024-06-10: 112 ug via ORAL
  Filled 2024-06-09: qty 1

## 2024-06-09 MED ORDER — CEFAZOLIN SODIUM-DEXTROSE 2-4 GM/100ML-% IV SOLN
2.0000 g | Freq: Three times a day (TID) | INTRAVENOUS | Status: AC
  Administered 2024-06-09 – 2024-06-10 (×2): 2 g via INTRAVENOUS
  Filled 2024-06-09 (×2): qty 100

## 2024-06-09 MED ORDER — ONDANSETRON HCL 4 MG/2ML IJ SOLN: 4.0000 mg | Freq: Four times a day (QID) | INTRAMUSCULAR | Status: DC | PRN

## 2024-06-09 MED ORDER — METHOCARBAMOL 500 MG PO TABS: 500.0000 mg | ORAL_TABLET | Freq: Four times a day (QID) | ORAL | Status: DC | PRN

## 2024-06-09 MED ORDER — LACTATED RINGERS IV SOLN: INTRAVENOUS | Status: DC

## 2024-06-09 MED ORDER — SODIUM CHLORIDE 0.9 % IV SOLN: 250.0000 mL | INTRAVENOUS | Status: DC

## 2024-06-09 MED ORDER — BUPIVACAINE HCL (PF) 0.5 % IJ SOLN
INTRAMUSCULAR | Status: DC | PRN
  Administered 2024-06-09: 5 mL

## 2024-06-09 MED ORDER — ONDANSETRON HCL 4 MG/2ML IJ SOLN: 4.0000 mg | Freq: Once | INTRAMUSCULAR | Status: DC | PRN

## 2024-06-09 MED ORDER — CHLORHEXIDINE GLUCONATE CLOTH 2 % EX PADS: 6.0000 | MEDICATED_PAD | Freq: Once | CUTANEOUS | Status: DC

## 2024-06-09 MED ORDER — MORPHINE SULFATE (PF) 2 MG/ML IV SOLN: 2.0000 mg | INTRAVENOUS | Status: DC | PRN

## 2024-06-09 MED ORDER — OXYCODONE HCL 5 MG PO TABS: 5.0000 mg | ORAL_TABLET | Freq: Once | ORAL | Status: DC | PRN

## 2024-06-09 MED ORDER — BISACODYL 10 MG RE SUPP: 10.0000 mg | Freq: Every day | RECTAL | Status: DC | PRN

## 2024-06-09 MED ORDER — CHLORHEXIDINE GLUCONATE 0.12 % MT SOLN
15.0000 mL | Freq: Once | OROMUCOSAL | Status: AC
Start: 2024-06-09 — End: 2024-06-09

## 2024-06-09 MED ORDER — THROMBIN 5000 UNITS EX SOLR
OROMUCOSAL | Status: DC | PRN
  Administered 2024-06-09: 5 mL via TOPICAL

## 2024-06-09 MED ORDER — PHENYLEPHRINE HCL-NACL 20-0.9 MG/250ML-% IV SOLN
INTRAVENOUS | Status: DC | PRN
  Administered 2024-06-09: 30 ug/min via INTRAVENOUS

## 2024-06-09 MED ORDER — LIDOCAINE-EPINEPHRINE 1 %-1:100000 IJ SOLN
INTRAMUSCULAR | Status: DC | PRN
  Administered 2024-06-09: 5 mL

## 2024-06-09 MED ORDER — CEFAZOLIN SODIUM-DEXTROSE 2-4 GM/100ML-% IV SOLN
2.0000 g | INTRAVENOUS | Status: AC
  Administered 2024-06-09: 2 g via INTRAVENOUS

## 2024-06-09 MED ORDER — ONDANSETRON HCL 4 MG/2ML IJ SOLN
INTRAMUSCULAR | Status: DC | PRN
  Administered 2024-06-09: 4 mg via INTRAVENOUS

## 2024-06-09 MED ORDER — METOPROLOL TARTRATE 25 MG PO TABS
25.0000 mg | ORAL_TABLET | Freq: Once | ORAL | Status: AC
  Administered 2024-06-09: 25 mg via ORAL

## 2024-06-09 MED ORDER — PANTOPRAZOLE SODIUM 40 MG IV SOLR: 40.0000 mg | Freq: Every day | INTRAVENOUS | Status: DC

## 2024-06-09 MED ORDER — CHLORHEXIDINE GLUCONATE 0.12 % MT SOLN
OROMUCOSAL | Status: AC
  Administered 2024-06-09: 15 mL via OROMUCOSAL
  Filled 2024-06-09: qty 15

## 2024-06-09 MED ORDER — 0.9 % SODIUM CHLORIDE (POUR BTL) OPTIME
TOPICAL | Status: DC | PRN
  Administered 2024-06-09: 1000 mL

## 2024-06-09 MED ORDER — FENTANYL CITRATE (PF) 100 MCG/2ML IJ SOLN
INTRAMUSCULAR | Status: AC
Start: 2024-06-09 — End: ?
  Filled 2024-06-09: qty 2

## 2024-06-09 MED ORDER — PROPOFOL 500 MG/50ML IV EMUL
INTRAVENOUS | Status: DC | PRN
  Administered 2024-06-09: 150 ug/kg/min via INTRAVENOUS

## 2024-06-09 MED ORDER — DOCUSATE SODIUM 100 MG PO CAPS
100.0000 mg | ORAL_CAPSULE | Freq: Two times a day (BID) | ORAL | Status: DC
  Filled 2024-06-09: qty 1

## 2024-06-09 MED ORDER — PROPOFOL 10 MG/ML IV BOLUS
INTRAVENOUS | Status: AC
  Filled 2024-06-09: qty 20

## 2024-06-09 MED ORDER — ALBUMIN HUMAN 5 % IV SOLN
INTRAVENOUS | Status: DC | PRN
Start: 2024-06-09 — End: 2024-06-09

## 2024-06-09 MED ORDER — MEPERIDINE HCL 25 MG/ML IJ SOLN: 6.2500 mg | INTRAMUSCULAR | Status: DC | PRN

## 2024-06-09 SURGICAL SUPPLY — 51 items
BAG COUNTER SPONGE SURGICOUNT (BAG) ×1 IMPLANT
BENZOIN TINCTURE PRP APPL 2/3 (GAUZE/BANDAGES/DRESSINGS) IMPLANT
BLADE CLIPPER SURG (BLADE) IMPLANT
BLADE SURG 11 STRL SS (BLADE) ×1 IMPLANT
BUR MATCHSTICK NEURO 3.0 LAGG (BURR) ×1 IMPLANT
BUR PRECISION FLUTE 5.0 (BURR) IMPLANT
CANISTER SUCTION 3000ML PPV (SUCTIONS) ×1 IMPLANT
DERMABOND ADVANCED .7 DNX12 (GAUZE/BANDAGES/DRESSINGS) ×1 IMPLANT
DRAPE LAPAROTOMY 100X72X124 (DRAPES) ×1 IMPLANT
DRAPE MICROSCOPE SLANT 54X150 (MISCELLANEOUS) ×1 IMPLANT
DRAPE SURG 17X23 STRL (DRAPES) ×1 IMPLANT
DRSG OPSITE POSTOP 3X4 (GAUZE/BANDAGES/DRESSINGS) ×1 IMPLANT
DRSG OPSITE POSTOP 4X6 (GAUZE/BANDAGES/DRESSINGS) IMPLANT
DURAPREP 26ML APPLICATOR (WOUND CARE) ×1 IMPLANT
ELECTRODE REM PT RTRN 9FT ADLT (ELECTROSURGICAL) ×1 IMPLANT
FORCEPS BIPOLAR SPETZLER 8 1.0 (NEUROSURGERY SUPPLIES) IMPLANT
GAUZE 4X4 16PLY ~~LOC~~+RFID DBL (SPONGE) IMPLANT
GAUZE SPONGE 4X4 12PLY STRL (GAUZE/BANDAGES/DRESSINGS) IMPLANT
GLOVE BIOGEL PI IND STRL 7.5 (GLOVE) ×1 IMPLANT
GLOVE ECLIPSE 7.0 STRL STRAW (GLOVE) ×1 IMPLANT
GLOVE EXAM NITRILE XL STR (GLOVE) IMPLANT
GOWN STRL REUS W/ TWL LRG LVL3 (GOWN DISPOSABLE) ×2 IMPLANT
GOWN STRL REUS W/ TWL XL LVL3 (GOWN DISPOSABLE) IMPLANT
GOWN STRL REUS W/TWL 2XL LVL3 (GOWN DISPOSABLE) IMPLANT
HEMOSTAT POWDER KIT SURGIFOAM (HEMOSTASIS) ×1 IMPLANT
KIT BASIN OR (CUSTOM PROCEDURE TRAY) ×1 IMPLANT
KIT TURNOVER KIT B (KITS) ×1 IMPLANT
NDL HYPO 18GX1.5 BLUNT FILL (NEEDLE) IMPLANT
NDL HYPO 22X1.5 SAFETY MO (MISCELLANEOUS) ×1 IMPLANT
NDL SPNL 18GX3.5 QUINCKE PK (NEEDLE) IMPLANT
NEEDLE HYPO 18GX1.5 BLUNT FILL (NEEDLE) IMPLANT
NEEDLE HYPO 22X1.5 SAFETY MO (MISCELLANEOUS) ×1 IMPLANT
NEEDLE SPNL 18GX3.5 QUINCKE PK (NEEDLE) IMPLANT
NS IRRIG 1000ML POUR BTL (IV SOLUTION) ×1 IMPLANT
PACK LAMINECTOMY NEURO (CUSTOM PROCEDURE TRAY) ×1 IMPLANT
PAD ARMBOARD POSITIONER FOAM (MISCELLANEOUS) ×3 IMPLANT
PATTIES SURGICAL .5 X.5 (GAUZE/BANDAGES/DRESSINGS) ×1 IMPLANT
PATTIES SURGICAL 1X1 (DISPOSABLE) ×1 IMPLANT
SPIKE FLUID TRANSFER (MISCELLANEOUS) ×2 IMPLANT
SPONGE SURGIFOAM ABS GEL SZ50 (HEMOSTASIS) ×1 IMPLANT
SPONGE T-LAP 4X18 ~~LOC~~+RFID (SPONGE) IMPLANT
STRIP CLOSURE SKIN 1/2X4 (GAUZE/BANDAGES/DRESSINGS) IMPLANT
SUT VIC AB 0 CT1 18XCR BRD8 (SUTURE) ×1 IMPLANT
SUT VIC AB 2-0 CT1 18 (SUTURE) IMPLANT
SUT VICRYL 3-0 RB1 18 ABS (SUTURE) ×2 IMPLANT
SYR 30ML SLIP (SYRINGE) ×1 IMPLANT
SYR 3ML LL SCALE MARK (SYRINGE) IMPLANT
TIP KERRISON THIN FOOTPLATE 1M (MISCELLANEOUS) IMPLANT
TOWEL GREEN STERILE (TOWEL DISPOSABLE) ×1 IMPLANT
TOWEL GREEN STERILE FF (TOWEL DISPOSABLE) ×1 IMPLANT
WATER STERILE IRR 1000ML POUR (IV SOLUTION) ×1 IMPLANT

## 2024-06-09 NOTE — Anesthesia Postprocedure Evaluation (Signed)
 Anesthesia Post Note  Patient: Nicholas Lewis  Procedure(s) Performed: MICRODISCECTOMY, FAR LATERAL APPROACH, LEFT LUMBAR THREE-FOUR WITH BIOSPY OF TUMOR (Left)     Patient location during evaluation: PACU Anesthesia Type: General Level of consciousness: awake and alert Pain management: pain level controlled Vital Signs Assessment: post-procedure vital signs reviewed and stable Respiratory status: spontaneous breathing, nonlabored ventilation and respiratory function stable Cardiovascular status: blood pressure returned to baseline and stable Postop Assessment: no apparent nausea or vomiting Anesthetic complications: no  No notable events documented.  Last Vitals:  Vitals:   06/09/24 1745 06/09/24 1747  BP: (!) 140/71 (!) 145/81  Pulse: (!) 49 (!) 51  Resp: 18 15  Temp:  36.7 C  SpO2: 99% 98%    Last Pain:  Vitals:   06/09/24 1747  TempSrc:   PainSc: 0-No pain                 Armari Fussell,W. EDMOND

## 2024-06-09 NOTE — Transfer of Care (Signed)
 Immediate Anesthesia Transfer of Care Note  Patient: Nicholas Lewis  Procedure(s) Performed: MICRODISCECTOMY, FAR LATERAL APPROACH, LEFT LUMBAR THREE-FOUR WITH BIOSPY OF TUMOR (Left)  Patient Location: PACU  Anesthesia Type:General  Level of Consciousness: awake, alert , and oriented  Airway & Oxygen Therapy: Patient Spontanous Breathing and Patient connected to nasal cannula oxygen  Post-op Assessment: Report given to RN and Post -op Vital signs reviewed and stable  Post vital signs: Reviewed and stable  Last Vitals:  Vitals Value Taken Time  BP 137/70 06/09/24 1630  Temp    Pulse 63 06/09/24 1632  Resp 17 06/09/24 1632  SpO2 96 % 06/09/24 1632  Vitals shown include unfiled device data.  Last Pain:  Vitals:   06/09/24 1056  TempSrc:   PainSc: 3          Complications: No notable events documented.

## 2024-06-09 NOTE — H&P (Signed)
 Chief Complaint   Left leg pain  History of Present Illness  Nicholas Lewis is a 83 year old man I am seeing for the above although he is an established patient of my partner, Dr. Ellery Guthrie who is [place]. I was contacted by the patient's hematologist oncologist, Dr. Cheree Cords to see this patient urgently. Briefly, the patient underwent MRI lumbar spine for onset of left-sided posterior hip pain with radiation to the anterolateral aspect of the left thigh and into the left knee about six weeks ago around Easter. He has a more remote history of L4-S1 decompression fusion. He was initially seen by Dr. Ellery Guthrie where MRI revealed signal abnormality in the L3 vertebral body concerning for possible myeloma. He was therefore referred to Dr. Cheree Cords. Over the last several weeks the patient continues to complain of similar left-sided back and leg pain, although perhaps slightly improved compared to onset. He does not report any changes in bladder function. No right-sided symptoms. Since I last saw him in the office he reports continued improvement in the left leg pain.  At this point he describes primarily numbness along the anterolateral aspect of the thigh and knee, with minimal leg pain.  He does however continue to report more posterior and lateral hip pain on the left.  To review, the patient reports a history of well-controlled hypertension. No history of diabetes. No previous heart attack or stroke. He does report having previously found some nodules on his lungs. No known liver or kidney disease. He is not on any blood thinners or anti-platelet agents. He is a nonsmoker.   Past Medical History   Past Medical History:  Diagnosis Date   Abrasion    l arm   Arthritis    Atypical nevus 10/11/1997   medial left midcalf-mild (Dr. Del Favia )   Atypical nevus 11/24/2002   right back upper- marked (exc)   Cellulitis 07/2012   after surgery for left knee   Complication of anesthesia    slow to  wake up   Dog scratch 01/14/2015   office visit note from PCP on chart( to lower lip)    Gout    Hypertension    Pneumonia    hx of 2015    SCC (squamous cell carcinoma) 12/17/2012   forehead (CX35FU)   SCC (squamous cell carcinoma) 03/19/2018   right sideburn,sup (CX3FU)    SCC (squamous cell carcinoma) 03/19/2018   above right eyebrow (CX35FU)   SCC (squamous cell carcinoma) x 2 05/31/2014   right sideburn (Cx35FU),right shoulder (CX35FU)   SCCA (squamous cell carcinoma) of skin 07/05/2020   Left Mid Ear Rim (in situ)   SCCA (squamous cell carcinoma) of skin 07/05/2020   Left Upper Crus of Antihelix (in situ)   scca in situ 11/21/2004   left sideburn (CX35FU)   Squamous cell carcinoma of skin 09/08/2020   in situ- upper left mid crus helix ear(CX35FU)   Squamous cell carcinoma of skin 02/21/2021   right preauricular area- (CX35FU)   Squamous cell carcinoma of skin 02/21/2021   left superior crus of antilhelix- (CX35FU)    Past Surgical History   Past Surgical History:  Procedure Laterality Date   BACK SURGERY     COLONOSCOPY  11/10/2010   RMR: internal hemorrhoids , anal papilla, otherwise normal rectum.  Long tortuous colon , polyps  in the cecum and sigmoid segment status post removeal as described above.    COLONOSCOPY N/A 01/05/2016   Dr.Rourk- normal colonoscopy (redundant colon)  COLONOSCOPY WITH PROPOFOL  N/A 04/20/2021   Procedure: COLONOSCOPY WITH PROPOFOL ;  Surgeon: Suzette Espy, MD;  Location: [facility];  Service: Endoscopy;  Laterality: N/A;  am appt   CYSTECTOMY  12/31/2001   from spine   KNEE ARTHROSCOPY  12/31/1998   left   POLYPECTOMY  04/20/2021   Procedure: POLYPECTOMY;  Surgeon: Suzette Espy, MD;  Location: [facility];  Service: Endoscopy;;   THYROID  LOBECTOMY Left 01/20/2015   Procedure: LEFT THYROID  LOBECTOMY;  Surgeon: Oralee Billow, MD;  Location: [facility];  Service: General;  Laterality: Left;   TOTAL KNEE ARTHROPLASTY  08/06/2012    Procedure: TOTAL KNEE ARTHROPLASTY;  Surgeon: Verlinda Gloss, MD;  Location: [facility];  Service: Orthopedics;  Laterality: Left;   TOTAL KNEE ARTHROPLASTY Right 09/20/2014   Procedure: RIGHT TOTAL KNEE ARTHROPLASTY;  Surgeon: Aurther Blue, MD;  Location: [facility];  Service: Orthopedics;  Laterality: Right;   TOTAL KNEE ARTHROPLASTY Left     Social History   Social History   Tobacco Use   Smoking status: Never   Smokeless tobacco: Never  Vaping Use   Vaping status: Never Used  Substance Use Topics   Alcohol use: No   Drug use: No    Medications   Prior to Admission medications   Medication Sig Start Date End Date Taking? Authorizing Provider  acetaminophen  (TYLENOL ) 325 MG tablet Take 650 mg by mouth every 6 (six) hours as needed for moderate pain (pain score 4-6).   Yes [provider]  HYDROcodone -acetaminophen  (NORCO/VICODIN) 5-325 MG tablet Take 1 tablet by mouth 2 (two) times daily as needed for severe pain (pain score 7-10).   Yes [provider]  levothyroxine  (SYNTHROID , LEVOTHROID) 112 MCG tablet Take 112 mcg by mouth daily before breakfast.  01/18/17  Yes [provider]  losartan  (COZAAR ) 50 MG tablet Take 100 mg by mouth daily. 12/31/17  Yes [provider]  metoprolol  tartrate (LOPRESSOR ) 25 MG tablet Take 25 mg by mouth 2 (two) times daily.   Yes [provider]  dicyclomine (BENTYL) 10 MG capsule Take 10 mg by mouth daily.    [provider]  ibuprofen (ADVIL) 400 MG tablet Take 400 mg by mouth every 6 (six) hours as needed for mild pain (pain score 1-3).    [provider]    Allergies   Allergies  Allergen Reactions   Sulfa Antibiotics Other (See Comments)    Dizziness and vision problems   Mobic [Meloxicam] Rash   Percocet [Oxycodone -Acetaminophen ] Other (See Comments)    Very sleepy. Sensitive to all narcotics.    Review of Systems  ROS  Neurologic Exam  Awake, alert, oriented Memory and  concentration grossly intact Speech fluent, appropriate CN grossly intact Motor exam: Upper Extremities Deltoid Bicep Tricep Grip  Right 5/5 5/5 5/5 5/5  Left 5/5 5/5 5/5 5/5   Lower Extremities IP Quad PF DF EHL  Right 5/5 5/5 5/5 5/5 5/5  Left 5/5 5/5 5/5 5/5 5/5   Sensation grossly intact to LT  Imaging  MRI lumbar spine with and without contrast reveals an enhancing lesion within the inferolateral aspect of the L3 vertebral body on the left.  There is extraosseous soft tissue spread within the left L3-4 foramen likely compressing the exiting L3 nerve root.  Impression  - 83 y.o. male with left-sided lumbar radiculopathy likely related to compression of the L3 nerve root due to extraosseous soft tissue spread of a vertebral body lesion within L3.  Imaging characteristics are concerning for myeloma.  After speaking with the patient's hematologist, biopsy of the soft tissue mass appears to provide a better chance for diagnosis rather than biopsy of the bony lesion as demineralization can skew the results.  Plan  - We will plan on left L3-4 extraforaminal decompression and biopsy of soft tissue mass  I have reviewed the indications for the procedure as well as the details of the procedure and the expected postoperative course and recovery at length with the patient in the office. We have also reviewed in detail the risks, benefits, and alternatives to the procedure. All questions were answered and Altamese Ates provided informed consent to proceed.  Augusto Blonder, MD [facility]

## 2024-06-09 NOTE — Anesthesia Procedure Notes (Signed)
 Procedure Name: Intubation Date/Time: 06/09/2024 2:00 PM  Performed by: Emmitt Harp, CRNAPre-anesthesia Checklist: Patient identified, Emergency Drugs available, Suction available and Patient being monitored Patient Re-evaluated:Patient Re-evaluated prior to induction Oxygen Delivery Method: Circle System Utilized Preoxygenation: Pre-oxygenation with 100% oxygen Induction Type: IV induction Ventilation: Mask ventilation without difficulty Laryngoscope Size: Mac and 4 Grade View: Grade I Tube type: Oral Tube size: 7.5 mm Number of attempts: 1 Airway Equipment and Method: Stylet and Oral airway Placement Confirmation: ETT inserted through vocal cords under direct vision, positive ETCO2 and breath sounds checked- equal and bilateral Secured at: 23 cm Tube secured with: Tape Dental Injury: Teeth and Oropharynx as per pre-operative assessment

## 2024-06-09 NOTE — Op Note (Signed)
 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 Augusto Blonder, MD  ASSISTANT: Easter Golden, PA-C  ANESTHESIA: General Endotracheal  EBL: 800cc  SPECIMENS: L3 tumor for permanent pathology  DRAINS: None  COMPLICATIONS: None immediate  CONDITION: Hemodynamically stable to PACU  HISTORY: Nicholas Lewis is a 83 y.o. male with a history of previous lumbar surgery who presented with left-sided back and leg pain consistent with lumbar radiculopathy.  MRI did reveal a L3 osseous lesion with extraosseous or osseous spread into the L3-4 foramen causing some foraminal stenosis.  Imaging appearance was concerning for possible myeloma.  He was evaluated by hematology oncology.  There was concern that osseous biopsy and subsequent demineralization could affect biopsy results.  Open biopsy of the extraforaminal soft tissue mass with concurrent decompression was therefore recommended.  The risks, benefits, and alternatives to surgery were all reviewed in detail with the patient and his wife.  After all questions were answered informed consent was obtained and witnessed.  PROCEDURE IN DETAIL: The patient was brought to the operating room. After induction of general anesthesia, the patient was positioned on the operative table in the prone position. All pressure points were meticulously padded.  The previous skin incision was then marked out and prepped and draped in the usual sterile fashion.  After timeout was conducted, a needle was introduced subcutaneously to identify the surface projection of the L3-4 interspace just superior to the previously placed L4-S1 instrumentation.  The midline incision was then infiltrated with local anesthetic with  epinephrine .  Incision was then made sharply and extended slightly superiorly.  Subcutaneous tissue was dissected with the Bovie.  The lumbodorsal fascia was identified.  The spinous processes were identified.  Subperiosteal dissection was carried out along the presumed L3 and L4 lamina.  I then was able to identify the previously placed left L4 pedicle screw and rod.  Self-retaining retractor was placed.  Further subperiosteal dissection was carried out to completely delineate the L3 pars interarticularis.  At this point the high-speed drill was used to remove the lateral aspect of the pars and the lateral aspect of the inferior articulating process of L3.  The intertransverse ligament was identified and elevated.  This was then removed with Kerrison punches.  I then immediately identified the left L3 nerve root within the foramen.  Kerrison punches and the high-speed drill were then used to remove a portion of the superior articulating process of L4.  In this manner, the entire foramen was unroofed and the L3 nerve root was completely decompressed circumferentially.  A ball dissector was used in the ventral space between the nerve root and the disc space.  No clear soft tissue mass was identified.  I did incise the disc space in an attempt to find subligamentous tumor again, I did not identify any soft tissue mass.  This point attention was turned just superior and lateral to the nerve root underneath the right L3 pedicle.  As I was dissecting this region and I took a small biopsy with the micropituitary.  While this tissue was sent for permanent pathology, I encountered brisk bleeding emanating from what appeared to be the inferior aspect of the L3 vertebral body, corresponding with the known location of the bony lesion.  Ultimately, this was controlled with morselized Gelfoam with thrombin   and multiple cottonoids.  Once the bleeding was controlled I irrigated further and no obvious bleeding was identified.   Again, I was not able to identify any further soft tissue mass within the foramen.  With the degree of bleeding, I do think that atypical hemangioma enters the differential for this lesion.  Nonetheless, given the degree of bleeding I elected not to attempt any further dissection or biopsy.  The wound was irrigated.  No further active bleeding was identified.  The nerve root was circumferentially decompressed within the foramen.  The self-retaining retractor was removed.  The wound was closed in multiple layers using a combination of interrupted 0 and 3-0 Vicryl stitches.  Dermabond was placed.  At the end of the case all sponge, needle, and instrument counts were correct. The patient was then transferred to the stretcher, extubated, and taken to the post-anesthesia care unit in stable hemodynamic condition.  Augusto Blonder, MD [facility]

## 2024-06-10 ENCOUNTER — Encounter (HOSPITAL_COMMUNITY): Payer: Self-pay | Admitting: Neurosurgery

## 2024-06-10 ENCOUNTER — Inpatient Hospital Stay: Admitting: Hematology

## 2024-06-10 DIAGNOSIS — C720 Malignant neoplasm of spinal cord: Secondary | ICD-10-CM | POA: Diagnosis not present

## 2024-06-10 DIAGNOSIS — Z79899 Other long term (current) drug therapy: Secondary | ICD-10-CM | POA: Diagnosis not present

## 2024-06-10 DIAGNOSIS — M5416 Radiculopathy, lumbar region: Secondary | ICD-10-CM | POA: Diagnosis not present

## 2024-06-10 DIAGNOSIS — Z85828 Personal history of other malignant neoplasm of skin: Secondary | ICD-10-CM | POA: Diagnosis not present

## 2024-06-10 DIAGNOSIS — Z96653 Presence of artificial knee joint, bilateral: Secondary | ICD-10-CM | POA: Diagnosis not present

## 2024-06-10 DIAGNOSIS — I10 Essential (primary) hypertension: Secondary | ICD-10-CM | POA: Diagnosis not present

## 2024-06-10 MED ORDER — METHOCARBAMOL 500 MG PO TABS: 500.0000 mg | ORAL_TABLET | Freq: Three times a day (TID) | ORAL | 0 refills | Status: AC | PRN

## 2024-06-10 MED ORDER — HYDROCODONE-ACETAMINOPHEN 5-325 MG PO TABS: 1.0000 | ORAL_TABLET | Freq: Four times a day (QID) | ORAL | 0 refills | Status: AC | PRN

## 2024-06-10 NOTE — Plan of Care (Signed)
 Pt doing well. Pt and wife given D/C instructions with verbal understanding. Rx's were sent to the pharmacy by MD. Pt's incision is clean and dry with no sign of infection. Pt's IV was removed prior to D/C. Pt D/C'd home via wheelchair per MD order. Pt is stable @ D/C and has no other needs at this time. Rema Fendt, RN

## 2024-06-10 NOTE — Discharge Summary (Signed)
 Physician Discharge Summary  Patient ID: Nicholas Lewis MRN: [id number] DOB/AGE: [birth year] 83 y.o.  Admit date: 06/09/2024 Discharge date: 06/10/2024  Admission Diagnoses:  Lumbar radiculopathy  Discharge Diagnoses:  Same Principal Problem:   Lumbar radiculopathy   Discharged Condition: Stable  Hospital Course:  Nicholas Lewis is a 83 y.o. male who underwent left L3-4 decompression, attempted biopsy of L3 lesion. He was at baseline postop reporting relief of left leg pain. He was ambulating well with pain controlled with oral medication and requested d/c home.  Treatments: Surgery - Left L3-4 extraforaminal decompression  Discharge Exam: Blood pressure 120/64, pulse 66, temperature 98.4 F (36.9 C), temperature source Oral, resp. rate 17, height 5' 10 (1.778 m), weight 89.8 kg, SpO2 98%. Awake, alert, oriented Speech fluent, appropriate CN grossly intact 5/5 BUE/BLE Wound c/d/i  Disposition: Discharge disposition: 01-Home or Self Care       Discharge Instructions     Call MD for:  redness, tenderness, or signs of infection (pain, swelling, redness, odor or green/yellow discharge around incision site)   Complete by: As directed    Call MD for:  temperature >100.4   Complete by: As directed    Diet - low sodium heart healthy   Complete by: As directed    Discharge instructions   Complete by: As directed    Walk at home as much as possible, at least 4 times / day   Incentive spirometry RT   Complete by: As directed    Increase activity slowly   Complete by: As directed    Lifting restrictions   Complete by: As directed    No lifting > 10 lbs   May shower / Bathe   Complete by: As directed    48 hours after surgery   May walk up steps   Complete by: As directed    Other Restrictions   Complete by: As directed    No bending/twisting at waist   Remove dressing in 24 hours   Complete by: As directed       Allergies as of 06/10/2024       Reactions    Sulfa Antibiotics Other (See Comments)   Dizziness and vision problems   Mobic [meloxicam] Rash   Percocet [oxycodone -acetaminophen ] Other (See Comments)   Very sleepy. Sensitive to all narcotics.        Medication List     TAKE these medications    acetaminophen  325 MG tablet Commonly known as: TYLENOL  Take 650 mg by mouth every 6 (six) hours as needed for moderate pain (pain score 4-6).   dicyclomine 10 MG capsule Commonly known as: BENTYL Take 10 mg by mouth daily.   HYDROcodone -acetaminophen  5-325 MG tablet Commonly known as: NORCO/VICODIN Take 1 tablet by mouth every 6 (six) hours as needed for up to 7 days for severe pain (pain score 7-10). What changed: when to take this   ibuprofen 400 MG tablet Commonly known as: ADVIL Take 400 mg by mouth every 6 (six) hours as needed for mild pain (pain score 1-3).   levothyroxine  112 MCG tablet Commonly known as: SYNTHROID  Take 112 mcg by mouth daily before breakfast.   losartan  50 MG tablet Commonly known as: COZAAR  Take 100 mg by mouth daily.   methocarbamol  500 MG tablet Commonly known as: ROBAXIN  Take 1 tablet (500 mg total) by mouth every 8 (eight) hours as needed for muscle spasms.   metoprolol  tartrate 25 MG tablet Commonly known as: LOPRESSOR  Take 25 mg by  mouth 2 (two) times daily.        Follow-up Information     Augusto Blonder, MD Follow up in 3 week(s).   Specialty: Neurosurgery Contact information: [address] [place] [address] [phone]                 Signed: Deatra Face 06/10/2024, 9:51 AM

## 2024-06-10 NOTE — Progress Notes (Signed)
  NEUROSURGERY PROGRESS NOTE   No issues overnight. Pt reports no further hip or leg pain. Appropriate back soreness. Ambulating well.  EXAM:  BP 120/64 (BP Location: Left Arm)   Pulse 66   Temp 98.4 F (36.9 C) (Oral)   Resp 17   Ht 5' 10 (1.778 m)   Wt 89.8 kg   SpO2 98%   BMI 28.41 kg/m   Awake, alert, oriented  Speech fluent, appropriate  CN grossly intact  5/5 BUE/BLE   IMPRESSION:  83 y.o. male POD#1 Left L3-4 extraforaminal decompression. Unfortunately, biopsy is likely to be non-diagnostic as there was no appreciable soft-tissue mass in the foramen.  PLAN: - d/c home today - Pt already with heme/onc f/u next week   Augusto Blonder, MD [facility]

## 2024-06-15 LAB — SURGICAL PATHOLOGY

## 2024-06-16 ENCOUNTER — Inpatient Hospital Stay: Attending: Hematology | Admitting: Hematology

## 2024-06-16 VITALS — BP 159/85 | HR 69 | Temp 97.8°F | Resp 16 | Wt 207.0 lb

## 2024-06-16 DIAGNOSIS — R634 Abnormal weight loss: Secondary | ICD-10-CM | POA: Insufficient documentation

## 2024-06-16 DIAGNOSIS — R918 Other nonspecific abnormal finding of lung field: Secondary | ICD-10-CM | POA: Diagnosis not present

## 2024-06-16 DIAGNOSIS — K8689 Other specified diseases of pancreas: Secondary | ICD-10-CM | POA: Diagnosis not present

## 2024-06-16 DIAGNOSIS — M545 Low back pain, unspecified: Secondary | ICD-10-CM | POA: Diagnosis not present

## 2024-06-16 DIAGNOSIS — M899 Disorder of bone, unspecified: Secondary | ICD-10-CM

## 2024-06-16 NOTE — Progress Notes (Unsigned)
 RE: CT BIOPSY Received: Today Mugweru, Sam Creighton, MD  Derrell Flight PROCEDURE / BIOPSY REVIEW Date: 06/16/24  Requested Biopsy site: Sternum. Reason for request: HM on PET. Hx of pancreatic CA. Surgical L3 Bx negative Imaging review: Best seen on PET CT  Decision: Approved Imaging modality to perform: CT Schedule with: Moderate Sedation Schedule for: Any VIR  Additional comments: @VIR : HM sternal lesion on PET. Fused 69/196 @Schedulers . CT Sternal Lesion Bx. Mod sed  Please contact me with questions, concerns, or if issue pertaining to this request arise.  Art Largo, MD [facility]       Previous Messages    ----- Message ----- From: Derrell Flight Sent: 06/16/2024  12:48 PM EDT To: Lavona Pounds; Derrell Flight; Ir Procedu* Subject: CT BIOPSY                                      Procedure :CT BIOPSY  Reason :bx PET positive sternal lesion; suspect metastatic pancreatic adenocarcinoma Dx: Pancreatic mass [Z61.09 (ICD-10-CM)]; Bone lesion [M89.9 (ICD-10-CM)]   History :DG Lumbar Spine 2-3 Views,CT ABDOMEN W WO CONTRAST,CT CARDIAC SCORING,MR Lumbar Spine W Wo Contrast,NM PET Image Initial (PI) Skull Base To Thigh,CT CHEST WO CONTRAST  Provider: Paulett Boros, MD  Provider contact ; [phone]

## 2024-06-16 NOTE — Progress Notes (Signed)
 [facility] [address] [place] [address]   Clinic Day:  06/16/2024  Referring physician: Minus Amel, MD  Patient Care Team: Minus Amel, MD as PCP - General (Family Medicine) Riley Cheadle Windsor Hatcher, MD as Consulting Physician (Gastroenterology) Devon Fogo, MD (Inactive) as Consulting Physician (Dermatology) Dorthey Gave, PA-C as Physician Assistant (Dermatology)   ASSESSMENT & PLAN:   Assessment:  1.  Multiple lung nodules: - CT chest (04/09/2023 at [facility]): Left lower lobe lung nodules 5 and 6 mm - CT chest (04/02/2024): Pleural-based nodule/scarring in the anterior right apex at 1.4 cm, new.  Subpleural left lower lobe 1.5 cm nodule may correspond to 6 mm detailed on prior report from 2024.  Posterior left lower lobe pleural-based 1 cm nodule may correspond to 5 mm nodule detailed in 2024.  Multiple parenchymal pulmonary nodules which were not detailed on the prior and presumably new, right lower lobe 9 mm and left lower lobe 8 mm.  Total of at least 30 nodules are identified.  Gastrohepatic ligament soft tissue density could represent venous collaterals/adenopathy. - Weight loss 10-15 LB/1 year. - Colonoscopy (04/20/2021): 3 mm cecal tubular adenoma. - Multiple squamous cell carcinoma in situ and invasive cancers from the skin removed over the past several years. - 07/19/2022: Lip biopsy with invasive squamous cell carcinoma extending to the deep margin, well-differentiated - History of left thyroidectomy (01/20/2015): Benign follicular adenoma. - Intermittent diarrhea for the past 3 months, seeing Dr. Riley Cheadle later today.  He has a recent flareup of sciatica with the left leg radiation. - 04/27/2024: CA 19-9 elevated at 597. - PET scan (05/14/2024):Focal area of abnormal uptake in the tail of the pancreas with SUV 4.5 corresponding to a low-attenuation lesion measuring 2.7 cm.  Abnormal lymph node in the left upper abdomen 3.4 cm with no uptake.  Few  other prominent but not pathologically enlarged nodes in the periceliac, periaortic and central mesentery.  Spleen is enlarged measuring 13.4 cm.  Lucent lesion in the sternum with SUV 6.6.  L3 vertebral body lesion.  Multiple small lung nodules are not hypermetabolic.  2.  Social/family history: - Lives at home with his wife Nicholas [facility].  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  No prior exposure to asbestos.  Non-smoker. - Father had colon cancer.  Sister had colon cancer.  Mother had scleroderma.  Plan:  1.  Presumed pancreatic adenocarcinoma to the bones: - We reviewed L3 bone biopsy report: Fibrovascular tissue with small Doubtrex and fragment of striated muscle with no malignancy identified. - We reviewed CTAP pancreatic protocol (05/22/2024): Enlarged lymph node along the lesser curvature of the stomach measuring 3.7 x 2.0 cm.  There are additional several abnormal lymph nodes surrounding the celiac axis.  Mass in the tail of the pancreas measures 3.1 x 2.1 cm.  Invasion of the splenic hilum with associated occlusion of the splenic vein. - We have also reviewed [employer] results which showed K-ras G 12V and ATM [id number]. - Germline mutation testing was negative. - Recommend sternal lesion biopsy by IR.  Also recommend port placement at the same time. - We will also request GI for EUS to expedite the biopsy process. - He is having second opinion consultation at [facility] on Friday.  If they can get biopsy at [facility] quickly, he was told to proceed with it. - RTC after biopsy.  Will send NGS  on biopsy.  If there is BRCA1/2 or PALB 2 mutation, we will use FOLFIRINOX or gemcitabine plus cisplatin.  If not, we will consider gemcitabine and Abraxane.  2.  Left low back pain: - He had enhancing mass lesion along the posteroinferior left side of the L3 vertebral body measuring 2.6 x 2.7 x 1.2 cm. - He had a lumbar  radiculopathy at L3-4 level.  He had decompression of the nerve root and biopsy of the L3 lesion. - He reports pain has improved to 1-2/10 since the procedure.   Orders Placed This Encounter  Procedures   CT BIOPSY    Please place port same day    Standing Status:   Future    Expected Date:   06/23/2024    Expiration Date:   06/16/2025    Lab orders requested (DO NOT place separate lab orders, these will be automatically ordered during procedure specimen collection)::   Surgical Pathology    Reason for Exam (SYMPTOM  OR DIAGNOSIS REQUIRED):   bx PET positive sternal lesion; suspect metastatic pancreatic adenocarcinoma    [facility]   IR IMAGING GUIDED PORT INSERTION    Standing Status:   Future    Expected Date:   06/23/2024    Expiration Date:   06/16/2025    Reason for Exam (SYMPTOM  OR DIAGNOSIS REQUIRED):   chemotherapy administation    Preferred Imaging Location?:   [facility]     I,Helena R Teague,acting as a scribe for Paulett Boros, MD.,have documented all relevant documentation on the behalf of Paulett Boros, MD,as directed by  Paulett Boros, MD while in the presence of Paulett Boros, MD.  I, Paulett Boros MD, have reviewed the above documentation for accuracy and completeness, and I agree with the above.     Paulett Boros, MD   6/17/202512:07 PM  CHIEF COMPLAINT/PURPOSE OF CONSULT:   Diagnosis: abnormal chest CT with multiple lung nodules   Cancer Staging  No matching staging information was found for the patient.    Prior Therapy: none  Current Therapy: Under workup   HISTORY OF PRESENT ILLNESS:   Oncology History   No history exists.      Nicholas Lewis is a 83 y.o. male presenting to clinic today for evaluation of abnormal chest CT at the request of Dr. Glady Laming.  He recently underwent chest CT on 04/02/24 showing: multiple pulmonary nodules, with pattern and morphology suspicious for  metastatic disease; suspect upper abdominal portosystemic collaterals, new since remote CT from 2015; gastrohepatic ligament soft tissue density, venous collateral vs adenopathy.  Today, he states that he is doing well overall. His appetite level is at 100%. His energy level is at 75%.  INTERVAL HISTORY:   Nicholas Lewis is a 83 y.o. male presenting to the clinic today for follow-up of pancreatic tail mass . He was last seen by me on 05/20/24.  Since his last visit, he underwent CT abdomen on 05/22/24 that found: Low-attenuation mass lesion towards the tail the pancreas consistent with pancreatic adenocarcinoma. There is invasion of the splenic hilum with occlusion of the splenic vein in this location and multiple left upper quadrant collaterals. Splenomegaly as well. Pathologic lymph nodes seen along the lesser curve the stomach, gastrohepatic ligament region. There are several additional prominent nodes periceliac, portacaval and retroperitoneal which although are small have suspicious morphology. There is also some soft tissue thickening surrounding the celiac axis at its trifurcation. This could be closely approximated lymph nodes  versus true abnormal soft tissue and spread of disease. There is no specific narrowing of the vessels including the celiac, SMA or portal vein. Multiple small lung nodules identified throughout the lung bases.  Waylan had a microdisectomy of left L 3-4 with tumor biopsy on 06/09/24 under Dr. Nat Badger. Pathology of the L3 bone biopsy revealed: Fibrovascular tissue with small nerve twigs and fragment of striated  muscle with no malignancy identified and no bone present.   Today, he states that he is doing well overall. His appetite level is at 100%. His energy level is at 75%. Nicholas [facility] is accompanied by family members. He reports left hip neuropathic pain today is improved to a 1-2/10 severity, which has been a 6-8/10 over the past several days prior. Immediately after and during  surgery he felt no pain.    He has an appointment on 06/19/24 with Dr. Leighton Punches at [facility].   PAST MEDICAL HISTORY:   Past Medical History: Past Medical History:  Diagnosis Date   Abrasion    l arm   Arthritis    Atypical nevus 10/11/1997   medial left midcalf-mild (Dr. Del Favia )   Atypical nevus 11/24/2002   right back upper- marked (exc)   Cellulitis 07/2012   after surgery for left knee   Complication of anesthesia    slow to wake up   Dog scratch 01/14/2015   office visit note from PCP on chart( to lower lip)    Gout    Hypertension    Pneumonia    hx of 2015    SCC (squamous cell carcinoma) 12/17/2012   forehead (CX35FU)   SCC (squamous cell carcinoma) 03/19/2018   right sideburn,sup (CX3FU)    SCC (squamous cell carcinoma) 03/19/2018   above right eyebrow (CX35FU)   SCC (squamous cell carcinoma) x 2 05/31/2014   right sideburn (Cx35FU),right shoulder (CX35FU)   SCCA (squamous cell carcinoma) of skin 07/05/2020   Left Mid Ear Rim (in situ)   SCCA (squamous cell carcinoma) of skin 07/05/2020   Left Upper Crus of Antihelix (in situ)   scca in situ 11/21/2004   left sideburn (CX35FU)   Squamous cell carcinoma of skin 09/08/2020   in situ- upper left mid crus helix ear(CX35FU)   Squamous cell carcinoma of skin 02/21/2021   right preauricular area- (CX35FU)   Squamous cell carcinoma of skin 02/21/2021   left superior crus of antilhelix- (CX35FU)    Surgical History: Past Surgical History:  Procedure Laterality Date   BACK SURGERY     COLONOSCOPY  11/10/2010   RMR: internal hemorrhoids , anal papilla, otherwise normal rectum.  Long tortuous colon , polyps  in the cecum and sigmoid segment status post removeal as described above.    COLONOSCOPY N/A 01/05/2016   Dr.Rourk- normal colonoscopy (redundant colon)    COLONOSCOPY WITH PROPOFOL  N/A 04/20/2021   Procedure: COLONOSCOPY WITH PROPOFOL ;  Surgeon: Suzette Espy, MD;  Location: [facility];  Service:  Endoscopy;  Laterality: N/A;  am appt   CYSTECTOMY  12/31/2001   from spine   FAR LATERAL DECOMPRESSION 1 LEVEL Left 06/09/2024   Procedure: MICRODISCECTOMY, FAR LATERAL APPROACH, LEFT LUMBAR THREE-FOUR WITH BIOSPY OF TUMOR;  Surgeon: Augusto Blonder, MD;  Location: [facility] OR;  Service: Neurosurgery;  Laterality: Left;  MICRODISCECTOMY, FAR LATERAL APPROACH, LT L34 W/BX OF TUMOR   KNEE ARTHROSCOPY  12/31/1998   left   POLYPECTOMY  04/20/2021   Procedure: POLYPECTOMY;  Surgeon: Suzette Espy, MD;  Location: [facility]  ENDO [facility];  Service: Endoscopy;;   THYROID  LOBECTOMY Left 01/20/2015   Procedure: LEFT THYROID  LOBECTOMY;  Surgeon: Oralee Billow, MD;  Location: [facility]S;  Service: General;  Laterality: Left;   TOTAL KNEE ARTHROPLASTY  08/06/2012   Procedure: TOTAL KNEE ARTHROPLASTY;  Surgeon: Verlinda Gloss, MD;  Location: [facility];  Service: Orthopedics;  Laterality: Left;   TOTAL KNEE ARTHROPLASTY Right 09/20/2014   Procedure: RIGHT TOTAL KNEE ARTHROPLASTY;  Surgeon: Aurther Blue, MD;  Location: [facility];  Service: Orthopedics;  Laterality: Right;   TOTAL KNEE ARTHROPLASTY Left     Social History: Social History   Socioeconomic History   Marital status: Married    Spouse name: Not on file   Number of children: Not on file   Years of education: Not on file   Highest education level: Not on file  Occupational History   Not on file  Tobacco Use   Smoking status: Never   Smokeless tobacco: Never  Vaping Use   Vaping status: Never Used  Substance and Sexual Activity   Alcohol use: No   Drug use: No   Sexual activity: Not on file  Other Topics Concern   Not on file  Social History Narrative   ** Merged History Encounter **       Social Drivers of Health   Financial Resource Strain: Low Risk  (08/30/2022)   Overall Financial Resource Strain (CARDIA)    Difficulty of Paying Living Expenses: Not hard at all  Food Insecurity: No Food Insecurity (04/27/2024)   Hunger Vital Sign     Worried About Running Out of Food in the Last Year: Never true    Ran Out of Food in the Last Year: Never true  Transportation Needs: No Transportation Needs (08/30/2022)   PRAPARE - Administrator, Civil Service (Medical): No    Lack of Transportation (Non-Medical): No  Physical Activity: Not on file  Stress: Not on file  Social Connections: Not on file  Intimate Partner Violence: Not At Risk (04/27/2024)   Humiliation, Afraid, Rape, and Kick questionnaire    Fear of Current or Ex-Partner: No    Emotionally Abused: No    Physically Abused: No    Sexually Abused: No    Family History: Family History  Problem Relation Age of Onset   Colon cancer Father 36    Current Medications:  Current Outpatient Medications:    acetaminophen  (TYLENOL ) 325 MG tablet, Take 650 mg by mouth every 6 (six) hours as needed for moderate pain (pain score 4-6)., Disp: , Rfl:    HYDROcodone -acetaminophen  (NORCO/VICODIN) 5-325 MG tablet, Take 1 tablet by mouth every 6 (six) hours as needed for up to 7 days for severe pain (pain score 7-10)., Disp: 28 tablet, Rfl: 0   ibuprofen (ADVIL) 400 MG tablet, Take 400 mg by mouth every 6 (six) hours as needed for mild pain (pain score 1-3)., Disp: , Rfl:    levothyroxine  (SYNTHROID , LEVOTHROID) 112 MCG tablet, Take 112 mcg by mouth daily before breakfast. , Disp: , Rfl: 2   losartan  (COZAAR ) 50 MG tablet, Take 100 mg by mouth daily., Disp: , Rfl:    methocarbamol  (ROBAXIN ) 500 MG tablet, Take 1 tablet (500 mg total) by mouth every 8 (eight) hours as needed for muscle spasms., Disp: 60 tablet, Rfl: 0   metoprolol  tartrate (LOPRESSOR ) 25 MG tablet, Take 25 mg by mouth 2 (two) times daily., Disp: , Rfl:    dicyclomine  (BENTYL ) 10 MG capsule, Take  10 mg by mouth daily., Disp: , Rfl:    Allergies: Allergies  Allergen Reactions   Sulfa Antibiotics Other (See Comments)    Dizziness and vision problems   Mobic [Meloxicam] Rash   Percocet  [Oxycodone -Acetaminophen ] Other (See Comments)    Very sleepy. Sensitive to all narcotics.    REVIEW OF SYSTEMS:   Review of Systems  Constitutional:  Negative for chills, fatigue and fever.  HENT:   Negative for lump/mass, mouth sores, nosebleeds, sore throat and trouble swallowing.   Eyes:  Negative for eye problems.  Respiratory:  Negative for cough and shortness of breath.   Cardiovascular:  Negative for chest pain, leg swelling and palpitations.  Gastrointestinal:  Negative for abdominal pain, constipation, diarrhea, nausea and vomiting.  Genitourinary:  Negative for bladder incontinence, difficulty urinating, dysuria, frequency, hematuria and nocturia.   Musculoskeletal:  Negative for arthralgias, back pain, flank pain, myalgias and neck pain.       +left hip pain 1-2/10 severity  Skin:  Negative for itching and rash.  Neurological:  Positive for numbness (and burning sensation in left leg, 2/10 severity). Negative for dizziness and headaches.  Hematological:  Does not bruise/bleed easily.  Psychiatric/Behavioral:  Negative for depression, sleep disturbance and suicidal ideas. The patient is not nervous/anxious.   All other systems reviewed and are negative.    VITALS:   Blood pressure (!) 159/85, pulse 69, temperature 97.8 F (36.6 C), temperature source Oral, resp. rate 16, weight 207 lb 0.2 oz (93.9 kg), SpO2 98%.  Wt Readings from Last 3 Encounters:  06/16/24 207 lb 0.2 oz (93.9 kg)  06/09/24 198 lb (89.8 kg)  06/04/24 204 lb (92.5 kg)    Body mass index is 29.7 kg/m.  Performance status (ECOG): 1 - Symptomatic but completely ambulatory  PHYSICAL EXAM:   Physical Exam Vitals and nursing note reviewed. Exam conducted with a chaperone present.  Constitutional:      Appearance: Normal appearance.   Cardiovascular:     Rate and Rhythm: Normal rate and regular rhythm.     Pulses: Normal pulses.     Heart sounds: Normal heart sounds.  Pulmonary:     Effort:  Pulmonary effort is normal.     Breath sounds: Normal breath sounds.  Abdominal:     Palpations: Abdomen is soft. There is no hepatomegaly, splenomegaly or mass.     Tenderness: There is no abdominal tenderness.   Musculoskeletal:     Right lower leg: No edema.     Left lower leg: No edema.  Lymphadenopathy:     Cervical: No cervical adenopathy.     Right cervical: No superficial, deep or posterior cervical adenopathy.    Left cervical: No superficial, deep or posterior cervical adenopathy.     Upper Body:     Right upper body: No supraclavicular or axillary adenopathy.     Left upper body: No supraclavicular or axillary adenopathy.   Neurological:     General: No focal deficit present.     Mental Status: He is alert and oriented to person, place, and time.   Psychiatric:        Mood and Affect: Mood normal.        Behavior: Behavior normal.     LABS:   CBC    Component Value Date/Time   WBC 7.5 06/04/2024 1430   RBC 4.48 06/04/2024 1430   HGB 13.6 06/09/2024 1048   HCT 40.0 06/09/2024 1048   PLT 122 (L) 06/04/2024 1430   MCV  90.8 06/04/2024 1430   MCH 31.7 06/04/2024 1430   MCHC 34.9 06/04/2024 1430   RDW 13.2 06/04/2024 1430   LYMPHSABS 2.1 04/27/2024 0851   MONOABS 0.5 04/27/2024 0851   EOSABS 0.1 04/27/2024 0851   BASOSABS 0.0 04/27/2024 0851    CMP    Component Value Date/Time   NA 143 06/09/2024 1048   K 4.0 06/09/2024 1048   CL 107 06/09/2024 1048   CO2 28 04/27/2024 0851   GLUCOSE 123 (H) 06/09/2024 1048   BUN 21 06/09/2024 1048   CREATININE 0.90 06/09/2024 1048   CALCIUM 8.9 04/27/2024 0851   PROT 5.8 (L) 06/05/2024 0801   ALBUMIN  4.0 06/05/2024 0801   AST 23 06/05/2024 0801   ALT 18 06/05/2024 0801   ALKPHOS 219 (H) 06/05/2024 0801   BILITOT 0.7 06/05/2024 0801   GFRNONAA >60 04/27/2024 0851   GFRAA >60 08/06/2020 1301     Lab Results  Component Value Date   CEA1 4.5 04/27/2024   /  CEA  Date Value Ref Range Status  04/27/2024 4.5  0.0 - 4.7 ng/mL Final    Comment:    (NOTE)                             Nonsmokers          <3.9                             Smokers             <5.6 [employer] Electrochemiluminescence Immunoassay (ECLIA) Values obtained with different assay methods or kits cannot be used interchangeably.  Results cannot be interpreted as absolute evidence of the presence or absence of malignant disease. Performed At: [facility], [facility], [facility] [address] [place], [place] [id number] Pearlean Botts MD QI:[id number]    No results found for: PSA1 Lab Results  Component Value Date   CAN199 597 (H) 04/27/2024   No results found for: CAN125  No results found for: TOTALPROTELP, ALBUMINELP, A1GS, A2GS, BETS, BETA2SER, GAMS, MSPIKE, SPEI No results found for: TIBC, FERRITIN, IRONPCTSAT Lab Results  Component Value Date   LDH 141 04/27/2024     STUDIES:   DG Lumbar Spine 2-3 Views Result Date: 06/09/2024 CLINICAL DATA:  L3-4 micro discectomy. EXAM: LUMBAR SPINE - 2-3 VIEW COMPARISON:  MRI May 14, 2024. FINDINGS: Two intraoperative cross-table lateral projections of the lumbar spine were obtained. The first image demonstrates surgical probe directed toward posterior spinous process of L2. The second image demonstrates surgical probe directed toward posterior elements of L3. IMPRESSION: Surgical localization as described above. Electronically Signed   By: Rosalene Colon M.D.   On: 06/09/2024 16:44   CT ABDOMEN W WO CONTRAST Result Date: 05/22/2024 CLINICAL DATA:  Pancreatic mass. EXAM: CT ABDOMEN WITHOUT AND WITH CONTRAST TECHNIQUE: Multidetector CT imaging of the abdomen was performed following the standard protocol before and following the bolus administration of intravenous contrast. Dynamic study was performed including pancreatic phase imaging. RADIATION DOSE REDUCTION: This exam was performed according to the departmental dose-optimization program which includes  automated exposure control, adjustment of the mA and/or kV according to patient size and/or use of iterative reconstruction technique. CONTRAST:  OMNIPAQUE  IOHEXOL  300 MG/ML  SOLN COMPARISON:  PET-CT 05/14/2024. FINDINGS: Lower chest: Multiple tiny lung nodules identified along the lung bases. Please correlate with previous workup. No pleural effusion. Hepatobiliary: No enhancing space-occupying  liver lesion identified. Patent portal vein. Gallbladder is contracted with stones. Pancreas: Heterogeneous mass identified towards the tail the pancreas which is lower in density than surrounding parenchyma. Lesion on series 10 image 37 measures 3.1 by 2.1 cm. There is invasion of splenic hilum with associated occlusion of the splenic vein numerous left upper abdominal collateral vessels including surrounding the stomach. There is a short segment of severe tail atrophy with severe pancreatic ductal dilatation on series 10, image 38. Spleen: Spleen is enlarged with a axial maximal length of 14.2 cm. Preserved enhancement. Adrenals/Urinary Tract: Adrenal glands are preserved. On the precontrast dataset no abnormal calcifications seen within either kidney nor along the visualized portions of the ureters. No enhancing renal mass or collecting system dilatation. Few parapelvic cysts identified bilaterally as well as some tiny subcentimeter parenchymal cystic foci, Bosniak 2 lesions. Stomach/Bowel: Stomach is distended with fluid and debris. Visualized portions of large bowel have a normal course and caliber with some scattered colonic diverticula. Of note there is some loops of colon extending between the liver margin in the anterior abdominal wall, atypical distribution of bowel. The visualized small bowel is nondilated as well. Vascular/Lymphatic: Normal caliber aorta and IVC. Mild scattered vascular calcifications along the aorta and branch vessels. Of note there is some soft tissue thickening extending along the celiac  axis at the trifurcation. These could be abnormal adjacent nodes versus is confluent abnormal soft tissue. Please see series 5, image 42 of the coronal data set. As described on previous examinations there is an enlarged lymph node seen along the lesser curve of the stomach measuring 3.7 x 2.0 cm. There are additional several additional abnormal nodes surrounding the celiac axis. Example series 10, image 29 measures 10 by 16 mm, just superior and anterior to the portal vein. Prominent nodes portacaval. Node aortocaval more caudal on image 66 of series 10 on the measures 8 x 10 mm but has atypical morphology and would be suspicious. Other: No ascites seen in the visualized abdomen. Mild mesenteric stranding. Musculoskeletal: Fixation hardware identified along the lower lumbar spine at L4 through S1. Prosthetic disc material at these levels. Trace listhesis and stenosis. Laminectomy. Scattered degenerative changes elsewhere. IMPRESSION: Low-attenuation mass lesion towards the tail the pancreas consistent with pancreatic adenocarcinoma. There is invasion of the splenic hilum with occlusion of the splenic vein in this location and multiple left upper quadrant collaterals. Splenomegaly as well. Pathologic lymph nodes seen along the lesser curve the stomach, gastrohepatic ligament region. There are several additional prominent nodes periceliac, portacaval and retroperitoneal which although are small have suspicious morphology. There is also some soft tissue thickening surrounding the celiac axis at its trifurcation. This could be closely approximated lymph nodes versus true abnormal soft tissue and spread of disease. There is no specific narrowing of the vessels including the celiac, SMA or portal vein. Multiple small lung nodules identified throughout the lung bases. Please correlate with prior workup. Electronically Signed   By: Adrianna Horde M.D.   On: 05/22/2024 20:19   CT CARDIAC SCORING (SELF PAY ONLY) Result  Date: 05/19/2024 CLINICAL DATA:  Cardiovascular Disease Risk stratification EXAM: Coronary Calcium Score TECHNIQUE: A gated, non-contrast computed tomography scan of the heart was performed using 2.5 mm slice thickness. Axial images were analyzed on a dedicated workstation. Calcium scoring of the coronary arteries was performed using the Agatston method. FINDINGS: Coronary Calcium Score: Left main: 0 Left anterior descending artery: 17.2 Left circumflex artery: 48.1 Right coronary artery: 0 Total: 65.3 Percentile: 17th  Pericardium: Normal. Non-cardiac: See separate report from [facility]. IMPRESSION: 1. Coronary calcium score of 65.3. This was 17th percentile for age-, race-, and sex-matched controls. RECOMMENDATIONS: Coronary artery calcium (CAC) score is a strong predictor of incident coronary heart disease (CHD) and provides predictive information beyond traditional risk factors. CAC scoring is reasonable to use in the decision to withhold, postpone, or initiate statin therapy in intermediate-risk or selected borderline-risk asymptomatic adults (age 79-75 years and LDL-C >=70 to <190 mg/dL) who do not have diabetes or established atherosclerotic cardiovascular disease (ASCVD).* In intermediate-risk (10-year ASCVD risk >=7.5% to <20%) adults or selected borderline-risk (10-year ASCVD risk >=5% to <7.5%) adults in whom a CAC score is measured for the purpose of making a treatment decision the following recommendations have been made: If CAC=0, it is reasonable to withhold statin therapy and reassess in 5 to 10 years, as long as higher risk conditions are absent (diabetes mellitus, family history of premature CHD in first degree relatives (males <55 years; females <65 years), cigarette smoking, or LDL >=190 mg/dL). If CAC is 1 to 99, it is reasonable to initiate statin therapy for patients >=35 years of age. If CAC is >=100 or >=75th percentile, it is reasonable to initiate statin therapy at any age.  Cardiology referral should be considered for patients with CAC scores >=400 or >=75th percentile. *2018 AHA/ACC/AACVPR/AAPA/ABC/ACPM/ADA/AGS/APhA/ASPC/NLA/PCNA Guideline on the Management of Blood Cholesterol: A Report of the American College of Cardiology/American Heart Association Task Force on Clinical Practice Guidelines. J Am Coll Cardiol. 2019;73(24):3168-3209. Maudine Sos, MD Electronically Signed   By: Maudine Sos M.D.   On: 05/19/2024 16:29

## 2024-06-16 NOTE — Patient Instructions (Addendum)
 [facility] at [facility] Discharge Instructions   You were seen and examined today by Dr. Cheree Cords.  He reviewed the results of your biopsy which did not show any evidence of cancer.  He reviewed the results of your CT scan which did show some lymph nodes and a pancreatic mass.   He reviewed the results of your Guardant 360 blood test results. 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 This is not as helpful as a tissue biopsy because the amount of tumor burden in the blood is low.   Dr. Linnell Richardson will reach out to a GI specialist to see about obtaining a biopsy by way of EUS (endoscopic ultrasound and biopsy). We will send special testing on the biopsy once the tissue is obtained.   We will .   Return as scheduled.    Thank you for choosing  Cancer Center at [facility] to provide your oncology and hematology care.  To afford each patient quality time with our provider, please arrive at least 15 minutes before your scheduled appointment time.   If you have a lab appointment with the Cancer Center please come in thru the Main Entrance and check in at the main information desk.  You need to re-schedule your appointment should you arrive 10 or more minutes late.  We strive to give you quality time with our providers, and arriving late affects you and other patients whose appointments are after yours.  Also, if you no show three or more times for appointments you may be dismissed from the clinic at the providers discretion.     Again, thank you for choosing [facility].  Our hope is that these requests will decrease the amount of time that you wait before being seen by our physicians.       _____________________________________________________________  Should you have questions after your visit to [facility], please contact our office at [phone] and follow the prompts.  Our office hours are 8:00 a.m. and 4:30  p.m. Monday - Friday.  Please note that voicemails left after 4:00 p.m. may not be returned until the following business day.  We are closed weekends and major holidays.  You do have access to a nurse 24-7, just call the main number to the clinic [phone] and do not press any options, hold on the line and a nurse will answer the phone.    For prescription refill requests, have your pharmacy contact our office and allow 72 hours.    Due to Covid, you will need to wear a mask upon entering the hospital. If you do not have a mask, a mask will be given to you at the Main Entrance upon arrival. For doctor visits, patients may have 1 support person age 28 or older with them. For treatment visits, patients can not have anyone with them due to social distancing guidelines and our immunocompromised population.

## 2024-06-18 DIAGNOSIS — Z6830 Body mass index (BMI) 30.0-30.9, adult: Secondary | ICD-10-CM | POA: Diagnosis not present

## 2024-06-18 DIAGNOSIS — E6609 Other obesity due to excess calories: Secondary | ICD-10-CM | POA: Diagnosis not present

## 2024-06-18 DIAGNOSIS — M5416 Radiculopathy, lumbar region: Secondary | ICD-10-CM | POA: Diagnosis not present

## 2024-06-19 DIAGNOSIS — K8689 Other specified diseases of pancreas: Secondary | ICD-10-CM | POA: Diagnosis not present

## 2024-06-19 DIAGNOSIS — M899 Disorder of bone, unspecified: Secondary | ICD-10-CM | POA: Diagnosis not present

## 2024-06-23 DIAGNOSIS — E039 Hypothyroidism, unspecified: Secondary | ICD-10-CM | POA: Diagnosis not present

## 2024-06-23 DIAGNOSIS — C252 Malignant neoplasm of tail of pancreas: Secondary | ICD-10-CM | POA: Diagnosis not present

## 2024-06-23 DIAGNOSIS — Z79890 Hormone replacement therapy (postmenopausal): Secondary | ICD-10-CM | POA: Diagnosis not present

## 2024-06-23 DIAGNOSIS — I10 Essential (primary) hypertension: Secondary | ICD-10-CM | POA: Diagnosis not present

## 2024-06-23 DIAGNOSIS — C259 Malignant neoplasm of pancreas, unspecified: Secondary | ICD-10-CM | POA: Diagnosis not present

## 2024-06-23 DIAGNOSIS — Z888 Allergy status to other drugs, medicaments and biological substances status: Secondary | ICD-10-CM | POA: Diagnosis not present

## 2024-06-23 DIAGNOSIS — Z791 Long term (current) use of non-steroidal anti-inflammatories (NSAID): Secondary | ICD-10-CM | POA: Diagnosis not present

## 2024-06-23 DIAGNOSIS — R918 Other nonspecific abnormal finding of lung field: Secondary | ICD-10-CM | POA: Diagnosis not present

## 2024-06-23 DIAGNOSIS — Z882 Allergy status to sulfonamides status: Secondary | ICD-10-CM | POA: Diagnosis not present

## 2024-06-23 DIAGNOSIS — R591 Generalized enlarged lymph nodes: Secondary | ICD-10-CM | POA: Diagnosis not present

## 2024-06-23 DIAGNOSIS — R933 Abnormal findings on diagnostic imaging of other parts of digestive tract: Secondary | ICD-10-CM | POA: Diagnosis not present

## 2024-06-23 DIAGNOSIS — K8689 Other specified diseases of pancreas: Secondary | ICD-10-CM | POA: Diagnosis not present

## 2024-06-23 DIAGNOSIS — Z885 Allergy status to narcotic agent status: Secondary | ICD-10-CM | POA: Diagnosis not present

## 2024-06-23 DIAGNOSIS — Z79899 Other long term (current) drug therapy: Secondary | ICD-10-CM | POA: Diagnosis not present

## 2024-06-23 NOTE — Progress Notes (Signed)
Pt arrived to recovery via stretcher from procedure. Report received from RN & CRNA. Airway patent, abdomen soft, non-tender, IV patent, denied pain. Pt connected to monitor; VS monitored per protocol. Family brought back to recovery; d/c instructions reviewed.    MD in to assess patient and speak with patient and family regarding procedure and outcomes. IV removed prior to release. Transport ordered. Pt d/c to home, with family member, in stable condition.

## 2024-06-30 DIAGNOSIS — C252 Malignant neoplasm of tail of pancreas: Secondary | ICD-10-CM | POA: Diagnosis not present

## 2024-06-30 NOTE — Progress Notes (Signed)
 I spoke to Nicholas Lewis and his daughter Nicholas Lewis regarding biopsy results confirming pancreatic cancer. They would like referral here to medical oncology noting the medical oncologist they met will be moving. I am happy to place the referral. There was a question of diagnosis of myeloma as well. His labs here did not support that and I expressed my concern for metastatic pancreatic cancer. I recommend pausing on any other bone biopsies and getting established with our team here. He is unsure if he has a port a cath placement scheduled. If he does- from our standpoint he can proceed with port placement. He will call with any questions or concerns.   This telephone encounter was conducted with the patient's (or proxy's) verbal consent via secure, interactive audio telecommunications while in clinic/office/hospital.  The patient (or proxy) was instructed to have this encounter in a suitably private space and to only have persons present to whom they give permission to participate. In addition, the patient identity was confirmed by use of name plus an additional identifier.  I spent 10 minutes with patient and/or family on this phone visit today.  This visit was coded based on medical decision making (MDM).

## 2024-07-06 DIAGNOSIS — M25562 Pain in left knee: Secondary | ICD-10-CM | POA: Diagnosis not present

## 2024-07-07 ENCOUNTER — Ambulatory Visit (HOSPITAL_COMMUNITY)

## 2024-07-14 ENCOUNTER — Inpatient Hospital Stay: Admitting: Hematology

## 2024-07-14 DIAGNOSIS — C7800 Secondary malignant neoplasm of unspecified lung: Secondary | ICD-10-CM | POA: Diagnosis not present

## 2024-07-14 DIAGNOSIS — C259 Malignant neoplasm of pancreas, unspecified: Secondary | ICD-10-CM | POA: Diagnosis not present

## 2024-07-18 DIAGNOSIS — Z885 Allergy status to narcotic agent status: Secondary | ICD-10-CM | POA: Diagnosis not present

## 2024-07-18 DIAGNOSIS — R001 Bradycardia, unspecified: Secondary | ICD-10-CM | POA: Diagnosis not present

## 2024-07-18 DIAGNOSIS — E890 Postprocedural hypothyroidism: Secondary | ICD-10-CM | POA: Diagnosis not present

## 2024-07-18 DIAGNOSIS — Z79899 Other long term (current) drug therapy: Secondary | ICD-10-CM | POA: Diagnosis not present

## 2024-07-18 DIAGNOSIS — D696 Thrombocytopenia, unspecified: Secondary | ICD-10-CM | POA: Diagnosis not present

## 2024-07-18 DIAGNOSIS — R400 Somnolence: Secondary | ICD-10-CM | POA: Diagnosis not present

## 2024-07-18 DIAGNOSIS — R1312 Dysphagia, oropharyngeal phase: Secondary | ICD-10-CM | POA: Diagnosis not present

## 2024-07-18 DIAGNOSIS — M898X8 Other specified disorders of bone, other site: Secondary | ICD-10-CM | POA: Diagnosis not present

## 2024-07-18 DIAGNOSIS — C7801 Secondary malignant neoplasm of right lung: Secondary | ICD-10-CM | POA: Diagnosis not present

## 2024-07-18 DIAGNOSIS — R079 Chest pain, unspecified: Secondary | ICD-10-CM | POA: Diagnosis not present

## 2024-07-18 DIAGNOSIS — J90 Pleural effusion, not elsewhere classified: Secondary | ICD-10-CM | POA: Diagnosis not present

## 2024-07-18 DIAGNOSIS — J9811 Atelectasis: Secondary | ICD-10-CM | POA: Diagnosis not present

## 2024-07-18 DIAGNOSIS — Z515 Encounter for palliative care: Secondary | ICD-10-CM | POA: Diagnosis not present

## 2024-07-18 DIAGNOSIS — R072 Precordial pain: Secondary | ICD-10-CM | POA: Diagnosis not present

## 2024-07-18 DIAGNOSIS — C7800 Secondary malignant neoplasm of unspecified lung: Secondary | ICD-10-CM | POA: Diagnosis not present

## 2024-07-18 DIAGNOSIS — R9431 Abnormal electrocardiogram [ECG] [EKG]: Secondary | ICD-10-CM | POA: Diagnosis not present

## 2024-07-18 DIAGNOSIS — I10 Essential (primary) hypertension: Secondary | ICD-10-CM | POA: Diagnosis not present

## 2024-07-18 DIAGNOSIS — R0789 Other chest pain: Secondary | ICD-10-CM | POA: Diagnosis not present

## 2024-07-18 DIAGNOSIS — G893 Neoplasm related pain (acute) (chronic): Secondary | ICD-10-CM | POA: Diagnosis not present

## 2024-07-18 DIAGNOSIS — C7951 Secondary malignant neoplasm of bone: Secondary | ICD-10-CM | POA: Diagnosis not present

## 2024-07-18 DIAGNOSIS — C259 Malignant neoplasm of pancreas, unspecified: Secondary | ICD-10-CM | POA: Diagnosis not present

## 2024-07-18 DIAGNOSIS — Z888 Allergy status to other drugs, medicaments and biological substances status: Secondary | ICD-10-CM | POA: Diagnosis not present

## 2024-07-18 DIAGNOSIS — I517 Cardiomegaly: Secondary | ICD-10-CM | POA: Diagnosis not present

## 2024-07-18 DIAGNOSIS — Z882 Allergy status to sulfonamides status: Secondary | ICD-10-CM | POA: Diagnosis not present

## 2024-07-19 DIAGNOSIS — R072 Precordial pain: Secondary | ICD-10-CM | POA: Diagnosis not present

## 2024-07-19 DIAGNOSIS — C259 Malignant neoplasm of pancreas, unspecified: Secondary | ICD-10-CM | POA: Diagnosis not present

## 2024-07-19 DIAGNOSIS — R079 Chest pain, unspecified: Secondary | ICD-10-CM | POA: Diagnosis not present

## 2024-07-19 DIAGNOSIS — C7951 Secondary malignant neoplasm of bone: Secondary | ICD-10-CM | POA: Diagnosis not present

## 2024-07-19 DIAGNOSIS — C7801 Secondary malignant neoplasm of right lung: Secondary | ICD-10-CM | POA: Diagnosis not present

## 2024-07-20 ENCOUNTER — Telehealth (HOSPITAL_COMMUNITY): Payer: Self-pay

## 2024-07-20 DIAGNOSIS — Z515 Encounter for palliative care: Secondary | ICD-10-CM | POA: Diagnosis not present

## 2024-07-20 DIAGNOSIS — C7801 Secondary malignant neoplasm of right lung: Secondary | ICD-10-CM | POA: Diagnosis not present

## 2024-07-20 DIAGNOSIS — C259 Malignant neoplasm of pancreas, unspecified: Secondary | ICD-10-CM | POA: Diagnosis not present

## 2024-07-20 DIAGNOSIS — G893 Neoplasm related pain (acute) (chronic): Secondary | ICD-10-CM | POA: Diagnosis not present

## 2024-07-20 DIAGNOSIS — R079 Chest pain, unspecified: Secondary | ICD-10-CM | POA: Diagnosis not present

## 2024-07-20 DIAGNOSIS — R072 Precordial pain: Secondary | ICD-10-CM | POA: Diagnosis not present

## 2024-07-20 DIAGNOSIS — C7951 Secondary malignant neoplasm of bone: Secondary | ICD-10-CM | POA: Diagnosis not present

## 2024-07-20 NOTE — Telephone Encounter (Signed)
 [name] called to reschedule port and biopsy. spoke with his wife, She said He decided to go somewhere close to his family. She just didn't say where. Nicholas Lewis

## 2024-07-21 DIAGNOSIS — J90 Pleural effusion, not elsewhere classified: Secondary | ICD-10-CM | POA: Diagnosis not present

## 2024-07-21 DIAGNOSIS — R079 Chest pain, unspecified: Secondary | ICD-10-CM | POA: Diagnosis not present

## 2024-07-21 DIAGNOSIS — C7951 Secondary malignant neoplasm of bone: Secondary | ICD-10-CM | POA: Diagnosis not present

## 2024-07-21 DIAGNOSIS — Z515 Encounter for palliative care: Secondary | ICD-10-CM | POA: Diagnosis not present

## 2024-07-21 DIAGNOSIS — C259 Malignant neoplasm of pancreas, unspecified: Secondary | ICD-10-CM | POA: Diagnosis not present

## 2024-07-21 DIAGNOSIS — G893 Neoplasm related pain (acute) (chronic): Secondary | ICD-10-CM | POA: Diagnosis not present

## 2024-07-21 DIAGNOSIS — J9811 Atelectasis: Secondary | ICD-10-CM | POA: Diagnosis not present

## 2024-07-21 DIAGNOSIS — R072 Precordial pain: Secondary | ICD-10-CM | POA: Diagnosis not present

## 2024-07-21 DIAGNOSIS — C7801 Secondary malignant neoplasm of right lung: Secondary | ICD-10-CM | POA: Diagnosis not present

## 2024-07-22 DIAGNOSIS — Z515 Encounter for palliative care: Secondary | ICD-10-CM | POA: Diagnosis not present

## 2024-07-22 DIAGNOSIS — C7801 Secondary malignant neoplasm of right lung: Secondary | ICD-10-CM | POA: Diagnosis not present

## 2024-07-22 DIAGNOSIS — R072 Precordial pain: Secondary | ICD-10-CM | POA: Diagnosis not present

## 2024-07-22 DIAGNOSIS — G893 Neoplasm related pain (acute) (chronic): Secondary | ICD-10-CM | POA: Diagnosis not present

## 2024-07-22 DIAGNOSIS — R079 Chest pain, unspecified: Secondary | ICD-10-CM | POA: Diagnosis not present

## 2024-07-22 DIAGNOSIS — C259 Malignant neoplasm of pancreas, unspecified: Secondary | ICD-10-CM | POA: Diagnosis not present

## 2024-07-22 DIAGNOSIS — C7951 Secondary malignant neoplasm of bone: Secondary | ICD-10-CM | POA: Diagnosis not present

## 2024-07-23 DIAGNOSIS — R079 Chest pain, unspecified: Secondary | ICD-10-CM | POA: Diagnosis not present

## 2024-07-23 DIAGNOSIS — G893 Neoplasm related pain (acute) (chronic): Secondary | ICD-10-CM | POA: Diagnosis not present

## 2024-07-23 DIAGNOSIS — Z515 Encounter for palliative care: Secondary | ICD-10-CM | POA: Diagnosis not present

## 2024-07-23 DIAGNOSIS — R072 Precordial pain: Secondary | ICD-10-CM | POA: Diagnosis not present

## 2024-07-23 DIAGNOSIS — C259 Malignant neoplasm of pancreas, unspecified: Secondary | ICD-10-CM | POA: Diagnosis not present

## 2024-07-23 DIAGNOSIS — C7801 Secondary malignant neoplasm of right lung: Secondary | ICD-10-CM | POA: Diagnosis not present

## 2024-07-23 DIAGNOSIS — C7951 Secondary malignant neoplasm of bone: Secondary | ICD-10-CM | POA: Diagnosis not present

## 2024-07-24 ENCOUNTER — Encounter: Payer: Self-pay | Admitting: Internal Medicine

## 2024-07-24 DIAGNOSIS — R0789 Other chest pain: Secondary | ICD-10-CM | POA: Diagnosis not present

## 2024-07-24 DIAGNOSIS — E890 Postprocedural hypothyroidism: Secondary | ICD-10-CM | POA: Diagnosis not present

## 2024-07-24 DIAGNOSIS — C259 Malignant neoplasm of pancreas, unspecified: Secondary | ICD-10-CM | POA: Diagnosis not present

## 2024-07-24 DIAGNOSIS — C7800 Secondary malignant neoplasm of unspecified lung: Secondary | ICD-10-CM | POA: Diagnosis not present

## 2024-07-25 DIAGNOSIS — M48061 Spinal stenosis, lumbar region without neurogenic claudication: Secondary | ICD-10-CM | POA: Diagnosis not present

## 2024-07-25 DIAGNOSIS — E890 Postprocedural hypothyroidism: Secondary | ICD-10-CM | POA: Diagnosis not present

## 2024-07-25 DIAGNOSIS — C7951 Secondary malignant neoplasm of bone: Secondary | ICD-10-CM | POA: Diagnosis not present

## 2024-07-25 DIAGNOSIS — R001 Bradycardia, unspecified: Secondary | ICD-10-CM | POA: Diagnosis not present

## 2024-07-25 DIAGNOSIS — C7800 Secondary malignant neoplasm of unspecified lung: Secondary | ICD-10-CM | POA: Diagnosis not present

## 2024-07-25 DIAGNOSIS — Z556 Problems related to health literacy: Secondary | ICD-10-CM | POA: Diagnosis not present

## 2024-07-25 DIAGNOSIS — C259 Malignant neoplasm of pancreas, unspecified: Secondary | ICD-10-CM | POA: Diagnosis not present

## 2024-07-25 DIAGNOSIS — I10 Essential (primary) hypertension: Secondary | ICD-10-CM | POA: Diagnosis not present

## 2024-07-27 DIAGNOSIS — C7951 Secondary malignant neoplasm of bone: Secondary | ICD-10-CM | POA: Diagnosis not present

## 2024-07-28 DIAGNOSIS — Z5111 Encounter for antineoplastic chemotherapy: Secondary | ICD-10-CM | POA: Diagnosis not present

## 2024-07-28 DIAGNOSIS — M899 Disorder of bone, unspecified: Secondary | ICD-10-CM | POA: Diagnosis not present

## 2024-07-28 DIAGNOSIS — C7800 Secondary malignant neoplasm of unspecified lung: Secondary | ICD-10-CM | POA: Diagnosis not present

## 2024-07-28 DIAGNOSIS — C259 Malignant neoplasm of pancreas, unspecified: Secondary | ICD-10-CM | POA: Diagnosis not present

## 2024-07-28 DIAGNOSIS — C7951 Secondary malignant neoplasm of bone: Secondary | ICD-10-CM | POA: Diagnosis not present

## 2024-07-28 DIAGNOSIS — C252 Malignant neoplasm of tail of pancreas: Secondary | ICD-10-CM | POA: Diagnosis not present

## 2024-07-28 DIAGNOSIS — G893 Neoplasm related pain (acute) (chronic): Secondary | ICD-10-CM | POA: Diagnosis not present

## 2024-07-31 DIAGNOSIS — M48061 Spinal stenosis, lumbar region without neurogenic claudication: Secondary | ICD-10-CM | POA: Diagnosis not present

## 2024-07-31 DIAGNOSIS — G893 Neoplasm related pain (acute) (chronic): Secondary | ICD-10-CM | POA: Diagnosis not present

## 2024-07-31 DIAGNOSIS — C7951 Secondary malignant neoplasm of bone: Secondary | ICD-10-CM | POA: Diagnosis not present

## 2024-07-31 DIAGNOSIS — R001 Bradycardia, unspecified: Secondary | ICD-10-CM | POA: Diagnosis not present

## 2024-07-31 DIAGNOSIS — E890 Postprocedural hypothyroidism: Secondary | ICD-10-CM | POA: Diagnosis not present

## 2024-07-31 DIAGNOSIS — I10 Essential (primary) hypertension: Secondary | ICD-10-CM | POA: Diagnosis not present

## 2024-07-31 DIAGNOSIS — C259 Malignant neoplasm of pancreas, unspecified: Secondary | ICD-10-CM | POA: Diagnosis not present

## 2024-07-31 DIAGNOSIS — R530 Neoplastic (malignant) related fatigue: Secondary | ICD-10-CM | POA: Diagnosis not present

## 2024-07-31 DIAGNOSIS — Z556 Problems related to health literacy: Secondary | ICD-10-CM | POA: Diagnosis not present

## 2024-07-31 DIAGNOSIS — Z9189 Other specified personal risk factors, not elsewhere classified: Secondary | ICD-10-CM | POA: Diagnosis not present

## 2024-07-31 DIAGNOSIS — C7800 Secondary malignant neoplasm of unspecified lung: Secondary | ICD-10-CM | POA: Diagnosis not present

## 2024-08-05 DIAGNOSIS — C7951 Secondary malignant neoplasm of bone: Secondary | ICD-10-CM | POA: Diagnosis not present

## 2024-08-05 DIAGNOSIS — C259 Malignant neoplasm of pancreas, unspecified: Secondary | ICD-10-CM | POA: Diagnosis not present

## 2024-08-05 DIAGNOSIS — M48061 Spinal stenosis, lumbar region without neurogenic claudication: Secondary | ICD-10-CM | POA: Diagnosis not present

## 2024-08-05 DIAGNOSIS — Z556 Problems related to health literacy: Secondary | ICD-10-CM | POA: Diagnosis not present

## 2024-08-05 DIAGNOSIS — E890 Postprocedural hypothyroidism: Secondary | ICD-10-CM | POA: Diagnosis not present

## 2024-08-05 DIAGNOSIS — I10 Essential (primary) hypertension: Secondary | ICD-10-CM | POA: Diagnosis not present

## 2024-08-05 DIAGNOSIS — R001 Bradycardia, unspecified: Secondary | ICD-10-CM | POA: Diagnosis not present

## 2024-08-05 DIAGNOSIS — C7800 Secondary malignant neoplasm of unspecified lung: Secondary | ICD-10-CM | POA: Diagnosis not present

## 2024-08-06 ENCOUNTER — Telehealth: Payer: Self-pay | Admitting: Internal Medicine

## 2024-08-06 DIAGNOSIS — C259 Malignant neoplasm of pancreas, unspecified: Secondary | ICD-10-CM | POA: Diagnosis not present

## 2024-08-06 DIAGNOSIS — C7800 Secondary malignant neoplasm of unspecified lung: Secondary | ICD-10-CM | POA: Diagnosis not present

## 2024-08-06 NOTE — Telephone Encounter (Signed)
 Patient called stated he is starting chemo and patient states he wants to wait until chemo is over to proceed with any office visit/procedures.

## 2024-08-11 DIAGNOSIS — M899 Disorder of bone, unspecified: Secondary | ICD-10-CM | POA: Diagnosis not present

## 2024-08-11 DIAGNOSIS — Z1331 Encounter for screening for depression: Secondary | ICD-10-CM | POA: Diagnosis not present

## 2024-08-11 DIAGNOSIS — M25549 Pain in joints of unspecified hand: Secondary | ICD-10-CM | POA: Diagnosis not present

## 2024-08-11 DIAGNOSIS — C7951 Secondary malignant neoplasm of bone: Secondary | ICD-10-CM | POA: Diagnosis not present

## 2024-08-11 DIAGNOSIS — C259 Malignant neoplasm of pancreas, unspecified: Secondary | ICD-10-CM | POA: Diagnosis not present

## 2024-08-11 DIAGNOSIS — C7800 Secondary malignant neoplasm of unspecified lung: Secondary | ICD-10-CM | POA: Diagnosis not present

## 2024-08-11 DIAGNOSIS — Z5111 Encounter for antineoplastic chemotherapy: Secondary | ICD-10-CM | POA: Diagnosis not present

## 2024-08-11 DIAGNOSIS — E876 Hypokalemia: Secondary | ICD-10-CM | POA: Diagnosis not present

## 2024-08-11 DIAGNOSIS — G893 Neoplasm related pain (acute) (chronic): Secondary | ICD-10-CM | POA: Diagnosis not present

## 2024-08-11 DIAGNOSIS — C252 Malignant neoplasm of tail of pancreas: Secondary | ICD-10-CM | POA: Diagnosis not present

## 2024-08-11 DIAGNOSIS — Z79899 Other long term (current) drug therapy: Secondary | ICD-10-CM | POA: Diagnosis not present

## 2024-08-13 DIAGNOSIS — R001 Bradycardia, unspecified: Secondary | ICD-10-CM | POA: Diagnosis not present

## 2024-08-13 DIAGNOSIS — C7951 Secondary malignant neoplasm of bone: Secondary | ICD-10-CM | POA: Diagnosis not present

## 2024-08-13 DIAGNOSIS — C259 Malignant neoplasm of pancreas, unspecified: Secondary | ICD-10-CM | POA: Diagnosis not present

## 2024-08-13 DIAGNOSIS — C7800 Secondary malignant neoplasm of unspecified lung: Secondary | ICD-10-CM | POA: Diagnosis not present

## 2024-08-13 DIAGNOSIS — M48061 Spinal stenosis, lumbar region without neurogenic claudication: Secondary | ICD-10-CM | POA: Diagnosis not present

## 2024-08-13 DIAGNOSIS — I10 Essential (primary) hypertension: Secondary | ICD-10-CM | POA: Diagnosis not present

## 2024-08-13 DIAGNOSIS — E890 Postprocedural hypothyroidism: Secondary | ICD-10-CM | POA: Diagnosis not present

## 2024-08-13 DIAGNOSIS — Z556 Problems related to health literacy: Secondary | ICD-10-CM | POA: Diagnosis not present

## 2024-08-21 DIAGNOSIS — R001 Bradycardia, unspecified: Secondary | ICD-10-CM | POA: Diagnosis not present

## 2024-08-21 DIAGNOSIS — C7951 Secondary malignant neoplasm of bone: Secondary | ICD-10-CM | POA: Diagnosis not present

## 2024-08-21 DIAGNOSIS — I10 Essential (primary) hypertension: Secondary | ICD-10-CM | POA: Diagnosis not present

## 2024-08-21 DIAGNOSIS — M48061 Spinal stenosis, lumbar region without neurogenic claudication: Secondary | ICD-10-CM | POA: Diagnosis not present

## 2024-08-21 DIAGNOSIS — C259 Malignant neoplasm of pancreas, unspecified: Secondary | ICD-10-CM | POA: Diagnosis not present

## 2024-08-21 DIAGNOSIS — E890 Postprocedural hypothyroidism: Secondary | ICD-10-CM | POA: Diagnosis not present

## 2024-08-21 DIAGNOSIS — Z556 Problems related to health literacy: Secondary | ICD-10-CM | POA: Diagnosis not present

## 2024-08-21 DIAGNOSIS — C7800 Secondary malignant neoplasm of unspecified lung: Secondary | ICD-10-CM | POA: Diagnosis not present

## 2024-08-24 DIAGNOSIS — M48061 Spinal stenosis, lumbar region without neurogenic claudication: Secondary | ICD-10-CM | POA: Diagnosis not present

## 2024-08-24 DIAGNOSIS — Z556 Problems related to health literacy: Secondary | ICD-10-CM | POA: Diagnosis not present

## 2024-08-24 DIAGNOSIS — C259 Malignant neoplasm of pancreas, unspecified: Secondary | ICD-10-CM | POA: Diagnosis not present

## 2024-08-24 DIAGNOSIS — C7951 Secondary malignant neoplasm of bone: Secondary | ICD-10-CM | POA: Diagnosis not present

## 2024-08-24 DIAGNOSIS — R001 Bradycardia, unspecified: Secondary | ICD-10-CM | POA: Diagnosis not present

## 2024-08-24 DIAGNOSIS — E890 Postprocedural hypothyroidism: Secondary | ICD-10-CM | POA: Diagnosis not present

## 2024-08-24 DIAGNOSIS — C7800 Secondary malignant neoplasm of unspecified lung: Secondary | ICD-10-CM | POA: Diagnosis not present

## 2024-08-24 DIAGNOSIS — I10 Essential (primary) hypertension: Secondary | ICD-10-CM | POA: Diagnosis not present

## 2024-08-25 DIAGNOSIS — C252 Malignant neoplasm of tail of pancreas: Secondary | ICD-10-CM | POA: Diagnosis not present

## 2024-08-25 DIAGNOSIS — M7989 Other specified soft tissue disorders: Secondary | ICD-10-CM | POA: Diagnosis not present

## 2024-08-25 DIAGNOSIS — C259 Malignant neoplasm of pancreas, unspecified: Secondary | ICD-10-CM | POA: Diagnosis not present

## 2024-08-25 DIAGNOSIS — E876 Hypokalemia: Secondary | ICD-10-CM | POA: Diagnosis not present

## 2024-08-25 DIAGNOSIS — K8681 Exocrine pancreatic insufficiency: Secondary | ICD-10-CM | POA: Diagnosis not present

## 2024-08-25 DIAGNOSIS — G893 Neoplasm related pain (acute) (chronic): Secondary | ICD-10-CM | POA: Diagnosis not present

## 2024-08-25 DIAGNOSIS — Z515 Encounter for palliative care: Secondary | ICD-10-CM | POA: Diagnosis not present

## 2024-08-25 DIAGNOSIS — C7800 Secondary malignant neoplasm of unspecified lung: Secondary | ICD-10-CM | POA: Diagnosis not present

## 2024-08-25 DIAGNOSIS — Z5111 Encounter for antineoplastic chemotherapy: Secondary | ICD-10-CM | POA: Diagnosis not present

## 2024-08-25 DIAGNOSIS — C7951 Secondary malignant neoplasm of bone: Secondary | ICD-10-CM | POA: Diagnosis not present

## 2024-08-25 DIAGNOSIS — R600 Localized edema: Secondary | ICD-10-CM | POA: Diagnosis not present

## 2024-08-25 DIAGNOSIS — M899 Disorder of bone, unspecified: Secondary | ICD-10-CM | POA: Diagnosis not present

## 2024-09-01 DIAGNOSIS — Z515 Encounter for palliative care: Secondary | ICD-10-CM | POA: Diagnosis not present

## 2024-09-01 DIAGNOSIS — G893 Neoplasm related pain (acute) (chronic): Secondary | ICD-10-CM | POA: Diagnosis not present

## 2024-09-01 DIAGNOSIS — R530 Neoplastic (malignant) related fatigue: Secondary | ICD-10-CM | POA: Diagnosis not present

## 2024-09-01 DIAGNOSIS — C259 Malignant neoplasm of pancreas, unspecified: Secondary | ICD-10-CM | POA: Diagnosis not present

## 2024-09-02 DIAGNOSIS — C7800 Secondary malignant neoplasm of unspecified lung: Secondary | ICD-10-CM | POA: Diagnosis not present

## 2024-09-02 DIAGNOSIS — C259 Malignant neoplasm of pancreas, unspecified: Secondary | ICD-10-CM | POA: Diagnosis not present

## 2024-09-02 DIAGNOSIS — E890 Postprocedural hypothyroidism: Secondary | ICD-10-CM | POA: Diagnosis not present

## 2024-09-02 DIAGNOSIS — Z556 Problems related to health literacy: Secondary | ICD-10-CM | POA: Diagnosis not present

## 2024-09-02 DIAGNOSIS — R001 Bradycardia, unspecified: Secondary | ICD-10-CM | POA: Diagnosis not present

## 2024-09-02 DIAGNOSIS — C7951 Secondary malignant neoplasm of bone: Secondary | ICD-10-CM | POA: Diagnosis not present

## 2024-09-02 DIAGNOSIS — I10 Essential (primary) hypertension: Secondary | ICD-10-CM | POA: Diagnosis not present

## 2024-09-02 DIAGNOSIS — M48061 Spinal stenosis, lumbar region without neurogenic claudication: Secondary | ICD-10-CM | POA: Diagnosis not present

## 2024-09-08 DIAGNOSIS — T451X5A Adverse effect of antineoplastic and immunosuppressive drugs, initial encounter: Secondary | ICD-10-CM | POA: Diagnosis not present

## 2024-09-08 DIAGNOSIS — R600 Localized edema: Secondary | ICD-10-CM | POA: Diagnosis not present

## 2024-09-08 DIAGNOSIS — Z713 Dietary counseling and surveillance: Secondary | ICD-10-CM | POA: Diagnosis not present

## 2024-09-08 DIAGNOSIS — G893 Neoplasm related pain (acute) (chronic): Secondary | ICD-10-CM | POA: Diagnosis not present

## 2024-09-08 DIAGNOSIS — C252 Malignant neoplasm of tail of pancreas: Secondary | ICD-10-CM | POA: Diagnosis not present

## 2024-09-08 DIAGNOSIS — C259 Malignant neoplasm of pancreas, unspecified: Secondary | ICD-10-CM | POA: Diagnosis not present

## 2024-09-08 DIAGNOSIS — C7800 Secondary malignant neoplasm of unspecified lung: Secondary | ICD-10-CM | POA: Diagnosis not present

## 2024-09-08 DIAGNOSIS — Z5111 Encounter for antineoplastic chemotherapy: Secondary | ICD-10-CM | POA: Diagnosis not present

## 2024-09-08 DIAGNOSIS — E876 Hypokalemia: Secondary | ICD-10-CM | POA: Diagnosis not present

## 2024-09-08 DIAGNOSIS — Z79899 Other long term (current) drug therapy: Secondary | ICD-10-CM | POA: Diagnosis not present

## 2024-09-08 DIAGNOSIS — Z923 Personal history of irradiation: Secondary | ICD-10-CM | POA: Diagnosis not present

## 2024-09-08 DIAGNOSIS — C7951 Secondary malignant neoplasm of bone: Secondary | ICD-10-CM | POA: Diagnosis not present

## 2024-09-09 DIAGNOSIS — E890 Postprocedural hypothyroidism: Secondary | ICD-10-CM | POA: Diagnosis not present

## 2024-09-09 DIAGNOSIS — M48061 Spinal stenosis, lumbar region without neurogenic claudication: Secondary | ICD-10-CM | POA: Diagnosis not present

## 2024-09-09 DIAGNOSIS — R001 Bradycardia, unspecified: Secondary | ICD-10-CM | POA: Diagnosis not present

## 2024-09-09 DIAGNOSIS — C259 Malignant neoplasm of pancreas, unspecified: Secondary | ICD-10-CM | POA: Diagnosis not present

## 2024-09-09 DIAGNOSIS — I10 Essential (primary) hypertension: Secondary | ICD-10-CM | POA: Diagnosis not present

## 2024-09-09 DIAGNOSIS — C7800 Secondary malignant neoplasm of unspecified lung: Secondary | ICD-10-CM | POA: Diagnosis not present

## 2024-09-09 DIAGNOSIS — Z556 Problems related to health literacy: Secondary | ICD-10-CM | POA: Diagnosis not present

## 2024-09-09 DIAGNOSIS — C7951 Secondary malignant neoplasm of bone: Secondary | ICD-10-CM | POA: Diagnosis not present

## 2024-09-12 DIAGNOSIS — C259 Malignant neoplasm of pancreas, unspecified: Secondary | ICD-10-CM | POA: Diagnosis not present

## 2024-09-12 DIAGNOSIS — C7800 Secondary malignant neoplasm of unspecified lung: Secondary | ICD-10-CM | POA: Diagnosis not present
